# Patient Record
Sex: Female | Born: 1935 | Race: White | Hispanic: No | Marital: Single
Health system: Southern US, Community
[De-identification: ages and names within clinical notes are randomized; demographics above are authoritative.]

## PROBLEM LIST (undated history)

## (undated) DIAGNOSIS — R413 Other amnesia: Secondary | ICD-10-CM

---

## 2009-07-01 HISTORY — PX: LAPAROSCOPIC GASTRIC BANDING: SHX1100

## 2020-07-20 DIAGNOSIS — E782 Mixed hyperlipidemia: Secondary | ICD-10-CM | POA: Diagnosis not present

## 2020-07-20 DIAGNOSIS — I10 Essential (primary) hypertension: Secondary | ICD-10-CM | POA: Diagnosis not present

## 2020-07-20 DIAGNOSIS — F5101 Primary insomnia: Secondary | ICD-10-CM | POA: Diagnosis not present

## 2020-07-20 DIAGNOSIS — N3281 Overactive bladder: Secondary | ICD-10-CM | POA: Diagnosis not present

## 2020-07-20 DIAGNOSIS — N390 Urinary tract infection, site not specified: Secondary | ICD-10-CM | POA: Diagnosis not present

## 2020-07-20 DIAGNOSIS — K219 Gastro-esophageal reflux disease without esophagitis: Secondary | ICD-10-CM | POA: Diagnosis not present

## 2020-07-20 DIAGNOSIS — E1169 Type 2 diabetes mellitus with other specified complication: Secondary | ICD-10-CM | POA: Diagnosis not present

## 2020-10-22 ENCOUNTER — Emergency Department (HOSPITAL_COMMUNITY): Payer: Medicare Other

## 2020-10-22 ENCOUNTER — Encounter (HOSPITAL_COMMUNITY): Payer: Self-pay | Admitting: Internal Medicine

## 2020-10-22 ENCOUNTER — Inpatient Hospital Stay (HOSPITAL_COMMUNITY)
Admission: EM | Admit: 2020-10-22 | Discharge: 2020-10-25 | DRG: 871 | Disposition: A | Discharge status: Home Health | Payer: Medicare Other | Attending: Internal Medicine | Admitting: Internal Medicine

## 2020-10-22 DIAGNOSIS — N3281 Overactive bladder: Secondary | ICD-10-CM | POA: Diagnosis not present

## 2020-10-22 DIAGNOSIS — R680 Hypothermia, not associated with low environmental temperature: Secondary | ICD-10-CM | POA: Diagnosis present

## 2020-10-22 DIAGNOSIS — N1831 Chronic kidney disease, stage 3a: Secondary | ICD-10-CM

## 2020-10-22 DIAGNOSIS — D631 Anemia in chronic kidney disease: Secondary | ICD-10-CM | POA: Diagnosis not present

## 2020-10-22 DIAGNOSIS — Z9884 Bariatric surgery status: Secondary | ICD-10-CM | POA: Diagnosis not present

## 2020-10-22 DIAGNOSIS — A419 Sepsis, unspecified organism: Secondary | ICD-10-CM

## 2020-10-22 DIAGNOSIS — Z20822 Contact with and (suspected) exposure to covid-19: Secondary | ICD-10-CM | POA: Diagnosis not present

## 2020-10-22 DIAGNOSIS — N281 Cyst of kidney, acquired: Secondary | ICD-10-CM | POA: Diagnosis not present

## 2020-10-22 DIAGNOSIS — I10 Essential (primary) hypertension: Secondary | ICD-10-CM

## 2020-10-22 DIAGNOSIS — N1832 Chronic kidney disease, stage 3b: Secondary | ICD-10-CM | POA: Diagnosis present

## 2020-10-22 DIAGNOSIS — E1122 Type 2 diabetes mellitus with diabetic chronic kidney disease: Secondary | ICD-10-CM

## 2020-10-22 DIAGNOSIS — M549 Dorsalgia, unspecified: Secondary | ICD-10-CM | POA: Diagnosis not present

## 2020-10-22 DIAGNOSIS — R6520 Severe sepsis without septic shock: Secondary | ICD-10-CM | POA: Diagnosis present

## 2020-10-22 DIAGNOSIS — Z6834 Body mass index (BMI) 34.0-34.9, adult: Secondary | ICD-10-CM

## 2020-10-22 DIAGNOSIS — F32A Depression, unspecified: Secondary | ICD-10-CM

## 2020-10-22 DIAGNOSIS — I959 Hypotension, unspecified: Secondary | ICD-10-CM | POA: Diagnosis present

## 2020-10-22 DIAGNOSIS — Z87891 Personal history of nicotine dependence: Secondary | ICD-10-CM

## 2020-10-22 DIAGNOSIS — N179 Acute kidney failure, unspecified: Secondary | ICD-10-CM | POA: Diagnosis not present

## 2020-10-22 DIAGNOSIS — Z66 Do not resuscitate: Secondary | ICD-10-CM | POA: Diagnosis present

## 2020-10-22 DIAGNOSIS — I499 Cardiac arrhythmia, unspecified: Secondary | ICD-10-CM | POA: Diagnosis not present

## 2020-10-22 DIAGNOSIS — D1771 Benign lipomatous neoplasm of kidney: Secondary | ICD-10-CM | POA: Diagnosis not present

## 2020-10-22 DIAGNOSIS — F0390 Unspecified dementia without behavioral disturbance: Secondary | ICD-10-CM | POA: Diagnosis present

## 2020-10-22 DIAGNOSIS — R6889 Other general symptoms and signs: Secondary | ICD-10-CM | POA: Diagnosis not present

## 2020-10-22 DIAGNOSIS — N390 Urinary tract infection, site not specified: Secondary | ICD-10-CM | POA: Diagnosis not present

## 2020-10-22 DIAGNOSIS — K219 Gastro-esophageal reflux disease without esophagitis: Secondary | ICD-10-CM

## 2020-10-22 DIAGNOSIS — K449 Diaphragmatic hernia without obstruction or gangrene: Secondary | ICD-10-CM | POA: Diagnosis not present

## 2020-10-22 DIAGNOSIS — R0902 Hypoxemia: Secondary | ICD-10-CM | POA: Diagnosis not present

## 2020-10-22 DIAGNOSIS — B9620 Unspecified Escherichia coli [E. coli] as the cause of diseases classified elsewhere: Secondary | ICD-10-CM | POA: Diagnosis present

## 2020-10-22 DIAGNOSIS — I129 Hypertensive chronic kidney disease with stage 1 through stage 4 chronic kidney disease, or unspecified chronic kidney disease: Secondary | ICD-10-CM | POA: Diagnosis present

## 2020-10-22 DIAGNOSIS — E669 Obesity, unspecified: Secondary | ICD-10-CM | POA: Diagnosis present

## 2020-10-22 DIAGNOSIS — E785 Hyperlipidemia, unspecified: Secondary | ICD-10-CM | POA: Diagnosis not present

## 2020-10-22 DIAGNOSIS — Z87440 Personal history of urinary (tract) infections: Secondary | ICD-10-CM

## 2020-10-22 DIAGNOSIS — I517 Cardiomegaly: Secondary | ICD-10-CM | POA: Diagnosis not present

## 2020-10-22 DIAGNOSIS — N261 Atrophy of kidney (terminal): Secondary | ICD-10-CM | POA: Diagnosis not present

## 2020-10-22 DIAGNOSIS — R404 Transient alteration of awareness: Secondary | ICD-10-CM | POA: Diagnosis not present

## 2020-10-22 DIAGNOSIS — R9431 Abnormal electrocardiogram [ECG] [EKG]: Secondary | ICD-10-CM | POA: Diagnosis not present

## 2020-10-22 DIAGNOSIS — Z743 Need for continuous supervision: Secondary | ICD-10-CM | POA: Diagnosis not present

## 2020-10-22 DIAGNOSIS — E119 Type 2 diabetes mellitus without complications: Secondary | ICD-10-CM

## 2020-10-22 DIAGNOSIS — J189 Pneumonia, unspecified organism: Secondary | ICD-10-CM | POA: Diagnosis present

## 2020-10-22 DIAGNOSIS — G9341 Metabolic encephalopathy: Secondary | ICD-10-CM | POA: Diagnosis present

## 2020-10-22 HISTORY — DX: Other amnesia: R41.3

## 2020-10-22 HISTORY — DX: Sepsis, unspecified organism: A41.9

## 2020-10-22 HISTORY — DX: Gastro-esophageal reflux disease without esophagitis: K21.9

## 2020-10-22 HISTORY — DX: Chronic kidney disease, stage 3a: N18.31

## 2020-10-22 HISTORY — DX: Type 2 diabetes mellitus with diabetic chronic kidney disease: E11.22

## 2020-10-22 HISTORY — DX: Essential (primary) hypertension: I10

## 2020-10-22 LAB — URINALYSIS, ROUTINE W REFLEX MICROSCOPIC
Glucose, UA: NEGATIVE mg/dL
Hgb urine dipstick: NEGATIVE
Ketones, ur: NEGATIVE mg/dL
Nitrite: NEGATIVE
Protein, ur: 30 mg/dL — AB
Specific Gravity, Urine: 1.023 (ref 1.005–1.030)
WBC, UA: >50 WBC/hpf — ABNORMAL HIGH (ref 0–5)
pH: 5 (ref 5.0–8.0)

## 2020-10-22 LAB — CBC WITH DIFFERENTIAL/PLATELET
Abs Immature Granulocytes: 0.12 10*3/uL — ABNORMAL HIGH (ref 0.00–0.07)
Basophils Absolute: 0 10*3/uL (ref 0.0–0.1)
Basophils Relative: 0 %
Eosinophils Absolute: 0 10*3/uL (ref 0.0–0.5)
Eosinophils Relative: 0 %
HCT: 36.4 % (ref 36.0–46.0)
Hemoglobin: 11.2 g/dL — ABNORMAL LOW (ref 12.0–15.0)
Immature Granulocytes: 1 %
Lymphocytes Relative: 5 %
Lymphs Abs: 1 10*3/uL (ref 0.7–4.0)
MCH: 27 pg (ref 26.0–34.0)
MCHC: 30.8 g/dL (ref 30.0–36.0)
MCV: 87.7 fL (ref 80.0–100.0)
Monocytes Absolute: 1.3 10*3/uL — ABNORMAL HIGH (ref 0.1–1.0)
Monocytes Relative: 7 %
Neutro Abs: 17.4 10*3/uL — ABNORMAL HIGH (ref 1.7–7.7)
Neutrophils Relative %: 87 %
Platelets: 297 10*3/uL (ref 150–400)
RBC: 4.15 MIL/uL (ref 3.87–5.11)
RDW: 16.3 % — ABNORMAL HIGH (ref 11.5–15.5)
WBC: 19.8 10*3/uL — ABNORMAL HIGH (ref 4.0–10.5)
nRBC: 0 % (ref 0.0–0.2)

## 2020-10-22 LAB — RESP PANEL BY RT-PCR (FLU A&B, COVID) ARPGX2
Influenza A by PCR: NEGATIVE
Influenza B by PCR: NEGATIVE
SARS Coronavirus 2 by RT PCR: NEGATIVE

## 2020-10-22 LAB — COMPREHENSIVE METABOLIC PANEL
ALT: 20 U/L (ref 0–44)
AST: 30 U/L (ref 15–41)
Albumin: 3.4 g/dL — ABNORMAL LOW (ref 3.5–5.0)
Alkaline Phosphatase: 65 U/L (ref 38–126)
Anion gap: 10 (ref 5–15)
BUN: 36 mg/dL — ABNORMAL HIGH (ref 8–23)
CO2: 21 mmol/L — ABNORMAL LOW (ref 22–32)
Calcium: 9.9 mg/dL (ref 8.9–10.3)
Chloride: 107 mmol/L (ref 98–111)
Creatinine, Ser: 2.1 mg/dL — ABNORMAL HIGH (ref 0.44–1.00)
GFR, Estimated: 22 mL/min — ABNORMAL LOW (ref >60)
Glucose, Bld: 125 mg/dL — ABNORMAL HIGH (ref 70–99)
Potassium: 4.6 mmol/L (ref 3.5–5.1)
Sodium: 138 mmol/L (ref 135–145)
Total Bilirubin: 0.6 mg/dL (ref 0.3–1.2)
Total Protein: 6 g/dL — ABNORMAL LOW (ref 6.5–8.1)

## 2020-10-22 LAB — LACTIC ACID, PLASMA: Lactic Acid, Venous: 1.8 mmol/L (ref 0.5–1.9)

## 2020-10-22 LAB — PROTIME-INR
INR: 1 (ref 0.8–1.2)
Prothrombin Time: 13.6 seconds (ref 11.4–15.2)

## 2020-10-22 MED ORDER — ACETAMINOPHEN 325 MG PO TABS
650.0000 mg | ORAL_TABLET | Freq: Four times a day (QID) | ORAL | Status: DC | PRN
  Administered 2020-10-23 – 2020-10-25 (×5): 650 mg via ORAL
  Filled 2020-10-22 (×5): qty 2

## 2020-10-22 MED ORDER — POLYETHYLENE GLYCOL 3350 17 G PO PACK: 17.0000 g | PACK | Freq: Every day | ORAL | Status: DC | PRN

## 2020-10-22 MED ORDER — ENOXAPARIN SODIUM 30 MG/0.3ML ~~LOC~~ SOLN
30.0000 mg | SUBCUTANEOUS | Status: DC
  Administered 2020-10-22 – 2020-10-23 (×2): 30 mg via SUBCUTANEOUS
  Filled 2020-10-22 (×3): qty 0.3

## 2020-10-22 MED ORDER — SODIUM CHLORIDE 0.9 % IV SOLN
2.0000 g | INTRAVENOUS | Status: DC
  Administered 2020-10-23 – 2020-10-24 (×2): 2 g via INTRAVENOUS
  Filled 2020-10-22 (×2): qty 2

## 2020-10-22 MED ORDER — METRONIDAZOLE IN NACL 5-0.79 MG/ML-% IV SOLN
500.0000 mg | Freq: Three times a day (TID) | INTRAVENOUS | Status: DC
  Administered 2020-10-23 (×2): 500 mg via INTRAVENOUS
  Filled 2020-10-22 (×2): qty 100

## 2020-10-22 MED ORDER — VANCOMYCIN HCL 1500 MG/300ML IV SOLN
1500.0000 mg | Freq: Once | INTRAVENOUS | Status: AC
  Administered 2020-10-22: 1500 mg via INTRAVENOUS
  Filled 2020-10-22: qty 300

## 2020-10-22 MED ORDER — VANCOMYCIN HCL 1000 MG/200ML IV SOLN
1000.0000 mg | Freq: Once | INTRAVENOUS | Status: DC
  Filled 2020-10-22: qty 200

## 2020-10-22 MED ORDER — LACTATED RINGERS IV SOLN: INTRAVENOUS | Status: DC

## 2020-10-22 MED ORDER — LACTATED RINGERS IV SOLN: INTRAVENOUS | Status: AC

## 2020-10-22 MED ORDER — SODIUM CHLORIDE 0.9 % IV BOLUS (SEPSIS)
1000.0000 mL | Freq: Once | INTRAVENOUS | Status: AC
  Administered 2020-10-22: 1000 mL via INTRAVENOUS

## 2020-10-22 MED ORDER — SODIUM CHLORIDE 0.9 % IV BOLUS (SEPSIS): 250.0000 mL | Freq: Once | INTRAVENOUS | Status: DC

## 2020-10-22 MED ORDER — METRONIDAZOLE IN NACL 5-0.79 MG/ML-% IV SOLN
500.0000 mg | Freq: Once | INTRAVENOUS | Status: AC
  Administered 2020-10-22: 500 mg via INTRAVENOUS
  Filled 2020-10-22: qty 100

## 2020-10-22 MED ORDER — SODIUM CHLORIDE 0.9% FLUSH
3.0000 mL | Freq: Two times a day (BID) | INTRAVENOUS | Status: DC
  Administered 2020-10-22: 3 mL via INTRAVENOUS

## 2020-10-22 MED ORDER — INSULIN ASPART 100 UNIT/ML ~~LOC~~ SOLN
0.0000 [IU] | SUBCUTANEOUS | Status: DC
  Administered 2020-10-23 – 2020-10-25 (×3): 1 [IU] via SUBCUTANEOUS

## 2020-10-22 MED ORDER — VANCOMYCIN VARIABLE DOSE PER UNSTABLE RENAL FUNCTION (PHARMACIST DOSING): Status: DC

## 2020-10-22 MED ORDER — SODIUM CHLORIDE 0.9 % IV BOLUS (SEPSIS)
1000.0000 mL | Freq: Once | INTRAVENOUS | Status: AC
Start: 2020-10-22 — End: 2020-10-22
  Administered 2020-10-22: 1000 mL via INTRAVENOUS

## 2020-10-22 MED ORDER — LORAZEPAM 2 MG/ML IJ SOLN
1.0000 mg | Freq: Once | INTRAMUSCULAR | Status: AC
  Administered 2020-10-22: 1 mg via INTRAVENOUS
  Filled 2020-10-22: qty 1

## 2020-10-22 MED ORDER — SODIUM CHLORIDE 0.9 % IV SOLN
2.0000 g | Freq: Once | INTRAVENOUS | Status: AC
  Administered 2020-10-22: 2 g via INTRAVENOUS
  Filled 2020-10-22: qty 2

## 2020-10-22 MED ORDER — ACETAMINOPHEN 650 MG RE SUPP: 650.0000 mg | Freq: Four times a day (QID) | RECTAL | Status: DC | PRN

## 2020-10-22 NOTE — ED Provider Notes (Signed)
[facility] Provider Note   CSN: [id number] Arrival date & time: 10/22/20  1704     History Chief Complaint  Patient presents with  . Altered Mental Status    [facility] is a 87 y.o. female history of depression, diabetes, previous UTI here presenting with altered mental status.  Patient has lower back pain for the last several days.  Patient is demented at baseline and lives by herself.  Her daughter saw her yesterday and she was slightly confused.  She ended up taking her to a restaurant today and she became acutely confused.  She was noted to be hypoxic and hypotensive and hypothermic per EMS.  Patient was given Versed by EMS.  Patient was not able to give any meaningful history  The history is provided by the patient and a relative.  Level V caveat- AMS      No past medical history on file.  There are no problems to display for this patient.    OB History   No obstetric history on file.     No family history on file.     Home Medications Prior to Admission medications   Not on File    Allergies    Patient has no known allergies.  Review of Systems   Review of Systems  Psychiatric/Behavioral: Positive for confusion.  All other systems reviewed and are negative.   Physical Exam Updated Vital Signs BP (!) 124/49   Pulse 80   Temp 98.1 F (36.7 C)   Resp (!) 22   Ht 5\' 4"  (1.626 m)   Wt 90.7 kg   SpO2 96%   BMI 34.33 kg/m   Physical Exam Vitals and nursing note reviewed.  Constitutional:      Comments: Confused and sedated from Versed  HENT:     Head: Normocephalic.     Nose: Nose normal.     Mouth/Throat:     Mouth: Mucous membranes are dry.  Eyes:     Extraocular Movements: Extraocular movements intact.     Pupils: Pupils are equal, round, and reactive to light.  Cardiovascular:     Rate and Rhythm: Normal rate and regular rhythm.     Pulses: Normal pulses.     Heart sounds: Normal heart  sounds.  Pulmonary:     Effort: Pulmonary effort is normal.     Breath sounds: Normal breath sounds.  Abdominal:     General: Abdomen is flat.     Palpations: Abdomen is soft.  Musculoskeletal:        General: Normal range of motion.     Cervical back: Normal range of motion and neck supple.  Skin:    General: Skin is warm.     Capillary Refill: Capillary refill takes less than 2 seconds.  Neurological:     Comments: Confused and altered and unable to give history.  Patient is moving all extremities spontaneously  Psychiatric:     Comments: Unable     ED Results / Procedures / Treatments   Labs (all labs ordered are listed, but only abnormal results are displayed) Labs Reviewed  URINALYSIS, ROUTINE W REFLEX MICROSCOPIC - Abnormal; Notable for the following components:      Result Value   Color, Urine AMBER (*)    APPearance CLOUDY (*)    Bilirubin Urine MODERATE (*)    Protein, ur 30 (*)    Leukocytes,Ua LARGE (*)    WBC, UA >50 (*)  Bacteria, UA FEW (*)    Non Squamous Epithelial 0-5 (*)    All other components within normal limits  RESP PANEL BY RT-PCR (FLU A&B, COVID) [id number]  CULTURE, BLOOD (ROUTINE X 2)  CULTURE, BLOOD (ROUTINE X 2)  URINE CULTURE  LACTIC ACID, PLASMA  LACTIC ACID, PLASMA  COMPREHENSIVE METABOLIC PANEL  CBC WITH DIFFERENTIAL/PLATELET  PROTIME-INR  APTT    EKG EKG Interpretation  Date/Time:  Sunday October 22 2020 17:05:50 EDT Ventricular Rate:  81 PR Interval:  121 QRS Duration: 83 QT Interval:  387 QTC Calculation: 450 R Axis:   131 Text Interpretation: Right and left arm electrode reversal, interpretation assumes no reversal Sinus rhythm Atrial premature complexes Probable lateral infarct, age indeterminate No significant change since last tracing Confirmed by Wandra Arthurs ([id number]) on 10/22/2020 5:16:49 PM   Radiology CT Head Wo Contrast  Result Date: 10/22/2020 CLINICAL DATA:  Altered mental status. EXAM: CT HEAD WITHOUT CONTRAST  TECHNIQUE: Contiguous axial images were obtained from the base of the skull through the vertex without intravenous contrast. COMPARISON:  None. FINDINGS: Brain: No evidence of acute large vascular territory infarction, hemorrhage, hydrocephalus, extra-axial collection or mass lesion/mass effect. There are few foci of white matter hypoattenuation which likely reflect chronic ischemic microvascular disease. Vascular: No hyperdense vessel. Atherosclerotic calcifications of the internal carotid arteries at the skull base. Skull: Normal. Negative for fracture or focal lesion. Sinuses/Orbits: The paranasal sinuses and mastoid air cells are predominantly clear. Prior lens surgery. Other: Dental hardware. IMPRESSION: 1. No acute intracranial findings. 2. Mild chronic ischemic microvascular disease. Electronically Signed   By: Dahlia Bailiff MD   On: 10/22/2020 18:00   DG Chest Port 1 View  Result Date: 10/22/2020 CLINICAL DATA:  Questionable sepsis - evaluate for abnormality Patient reports back pain. EXAM: PORTABLE CHEST 1 VIEW COMPARISON:  None. FINDINGS: Lung volumes are low. The heart is enlarged. Aortic atherosclerosis. No focal airspace disease. No pulmonary edema, pleural effusion, or pneumothorax. Surgical clips in the right axilla. External artifact extends external to the patient in the supraclavicular regions. IMPRESSION: Low lung volumes without acute pulmonary process. Cardiomegaly. Electronically Signed   By: Keith Rake M.D.   On: 10/22/2020 18:31   CT Renal Stone Study  Result Date: 10/22/2020 CLINICAL DATA:  Flank pain, kidney stone suspected. EXAM: CT ABDOMEN AND PELVIS WITHOUT CONTRAST TECHNIQUE: Multidetector CT imaging of the abdomen and pelvis was performed following the standard protocol without IV contrast. COMPARISON:  None. FINDINGS: Lower chest: Bronchial wall thickening with patchy nodular ground-glass opacities in the right middle lobe and lingula. Hypoventilatory change in the  dependent lungs. Cardiac enlargement. Coronary artery calcifications. Small pericardial effusion, likely physiologic. Hepatobiliary: There are few hypodense hepatic lesions the largest of which measures 1 cm in the hepatic dome on image 10/4 difficult to characterize due to size and respiratory motion but favored represent cysts. Otherwise unremarkable noncontrast appearance of the hepatic parenchyma. Gallbladder surgically absent. No biliary ductal dilation. Pancreas: Fatty pancreatic atrophy.  No discrete ductal dilation. Spleen: Normal in size without focal abnormality. Adrenals/Urinary Tract: Bilateral adrenal glands are unremarkable. No hydronephrosis. Atrophic kidneys. 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 2.8 cm lesion in the upper pole of the left kidney with density greater than that expected for a simple renal cyst but incompletely evaluated without IV contrast on image 31/4. 4 mm calcification along the inferior aspect of the lesion which may represent calcifications in the wall of the lesion or a small  renal stone. No right-sided renal stones. No ureteral or bladder calculi visualized. Urinary bladder is decompressed limiting evaluation. Stomach/Bowel: Small hiatal hernia. Changes of prior gastric banding with reservoir in the anterior abdominal wall. Stomach is grossly unremarkable for degree of distension. Fluid-filled loops of small bowel without dilation. Appendix is grossly unremarkable. Fluid throughout the ascending and proximal transverse colon. Left-sided colonic diverticulosis without findings of acute diverticulitis. Fluid-filled rectum. Vascular/Lymphatic: Aortic atherosclerosis. No enlarged abdominal or pelvic lymph nodes. Reproductive: Status post hysterectomy. No adnexal masses. Other: No abdominopelvic ascites. Musculoskeletal: Multilevel degenerative changes spine. No acute osseous abnormality. IMPRESSION: 1. Fluid throughout nondilated small bowel, proximal  colon and rectum as can be seen with diarrheal illness. 2. No hydronephrosis. No ureteral or bladder calculi visualized. 3. Bronchial wall thickening with patchy nodular ground-glass opacities in the right middle lobe and lingula may represent an infectious or inflammatory process, including mycobacterium avium intracellulare. 4. Left upper pole renal lesion measuring 2.8 cm with density greater than that expected for a simple renal cyst and may represent a hemorrhagic/proteinaceous cyst but is incompletely evaluated on this single phase study without IV contrast. Further evaluation with nonemergent outpatient renal protocol CT or MRI with and without contrast is recommended. 5. 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 6. Aortic atherosclerosis. Aortic Atherosclerosis (ICD10-I70.0). Electronically Signed   By: Dahlia Bailiff MD   On: 10/22/2020 18:16    Procedures Procedures   CRITICAL CARE Performed by: Wandra Arthurs   Total critical care time:30 minutes  Critical care time was exclusive of separately billable procedures and treating other patients.  Critical care was necessary to treat or prevent imminent or life-threatening deterioration.  Critical care was time spent personally by me on the following activities: development of treatment plan with patient and/or surrogate as well as nursing, discussions with consultants, evaluation of patient's response to treatment, examination of patient, obtaining history from patient or surrogate, ordering and performing treatments and interventions, ordering and review of laboratory studies, ordering and review of radiographic studies, pulse oximetry and re-evaluation of patient's condition.    Medications Ordered in ED Medications  lactated ringers infusion ( Intravenous New Bag/Given 10/22/20 1733)  sodium chloride 0.9 % bolus 1,000 mL (0 mLs Intravenous Stopped 10/22/20 2051)    And  sodium chloride 0.9 % bolus 1,000 mL (has no  administration in time range)    And  sodium chloride 0.9 % bolus 250 mL (has no administration in time range)  vancomycin (VANCOREADY) IVPB 1500 mg/300 mL (has no administration in time range)  ceFEPIme (MAXIPIME) 2 g in sodium chloride 0.9 % 100 mL IVPB (0 g Intravenous Stopped 10/22/20 1940)  metroNIDAZOLE (FLAGYL) IVPB 500 mg (0 mg Intravenous Stopped 10/22/20 2052)  LORazepam (ATIVAN) injection 1 mg (1 mg Intravenous Given 10/22/20 1722)    ED Course  I have reviewed the triage vital signs and the nursing notes.  Pertinent labs & imaging results that were available during my care of the patient were reviewed by me and considered in my medical decision making (see chart for details).    MDM Rules/Calculators/A&P                         [facility] is a 87 y.o. female here presenting with altered mental status and hypoxia and hypotension. Patient meets SIRS criteria and notes concern for possible sepsis.  Per the daughter, patient had severe sepsis from UTI previously and was  admitted several years ago.  Code sepsis was activated.  I ordered CBC, CMP, lactate, cultures, UA, urine culture, chest x-ray.  We will also get CT head and CT renal stone.  Will give 30 cc/kg bolus and broad-spectrum antibiotics  8:57 PM Patient is a difficult IV stick so unfortunately IV team had to come and get IV access on patient so labs are delayed. WBC is 19. Given broad spectrum abx. CT renal stone showed no hydro.   9:32 PM Labs showed creatinine of 2 with no baseline.  UA is positive for UTI.  Her CT did not show any hydro.  She has a cyst in her kidney .  Patient will be admitted for sepsis from UTI, delirium.  Final Clinical Impression(s) / ED Diagnoses Final diagnoses:  None    Rx / DC Orders ED Discharge Orders    None       Drenda Freeze, MD 10/22/20 2133

## 2020-10-22 NOTE — Sepsis Progress Note (Signed)
Patient has MD order to not delay antibiotics if unable to obtain blood cultures.  Antibiotics given without cultures.  Message sent via secure chat to assigned RN to enter height & weight into Epic for IVF bolus calculation.

## 2020-10-22 NOTE — Progress Notes (Addendum)
Sepsis note  elink RN sent secure chat to assigned bedside RN to see if there were barriers prevent sepsis from being conducted. From reviewing the chart sepsis has been called now for 1.5 hrs and it doesn't appear labs have been drawn or abx started.   Bedside RN replied back they are having a hard time getting an IV started

## 2020-10-22 NOTE — Progress Notes (Signed)
[facility] following sepsis 

## 2020-10-22 NOTE — H&P (Signed)
History and Physical   Lindsay Martinez FTD:[id number] DOB: 1931/03/19 DOA: 10/22/2020  PCP: No primary care provider on file.   Patient coming from: Home  Chief Complaint: Altered mental status  HPI: Lindsay Martinez is a 87 y.o. female with medical history significant of dementia, overactive bladder, hypertension, hyperlipidemia, depression, GERD, diabetes who presents with altered mental status.  Patient presents with altered oral status.  History obtained with assistance of chart review and family.  She been complaining of some low back pain for the past 2  days possibly some urinary symptoms.  She has dementia at baseline and does currently live alone.  Her daughter lives nearby and saw her mother yesterday and noticed that she may have been a little bit confused.  They went out out to a restaurant today and she became acutely confused and her daughter got her mother home quickly.  She became more confused and unwell at home and EMS was called she was found to be hypoxic, hypothermic and hypotensive on arrival.  She was also agitated and received Versed for this.  Per family she has a history of prior UTI and sepsis requiring admission.  Also has dementia that this has not been formally diagnosed per her records.  Unable to obtain full review of systems due to patient's altered mental status.  ED Course: Vital signs in ED significant for initial soft blood pressure in the 06C systolic improved to the 376E to 110s after IV fluid bolus, respiratory rate in the 20s, hypothermia in the 93-96 range.  Lab work-up showed CMP with bicarb 12.1, creatinine 2.1 with no baseline to compare, glucose 125, protein 6, albumin 3.4.  CBC showed hemoglobin 11.2 and WBC 19.8.  PT, INR within normal limits.  Lactic acid normal on first check with repeat pending.  Respiratory panel flu COVID-negative.  Urinalysis consistent with UTI.  Urine culture and blood cultures pending.  Imaging work-up included chest x-ray with low  lung volumes but no acute changes.  CT head with no acute abnormality.  CT renal stone study with fluid in the bowel consistent with diarrheal illness, no hydronephrosis or obstructing stones, bronchial wall thickening and patchy groundglass opacity noted in lower lung fields, left renal pole lesion.  Patient was started on broad-spectrum antibiotics, given 2.5 L bolus and also received dose of Ativan for agitation.  Review of Systems: Unable to obtain full review of systems in patient's status.  Past Medical History:  Diagnosis Date  . Memory impairment   For chart review has a history of: Overactive bladder Hypertension Hyperlipidemia Depression GERD Diabetes  Past Surgical History:  Procedure Laterality Date  . LAPAROSCOPIC GASTRIC BANDING  2011    Social History  reports that she has quit smoking. She has never used smokeless tobacco. She reports current alcohol use. No history on file for drug use.  No Known Allergies  Family History  Family history unknown: Yes  Unknown at this time  Prior to Admission medications   Not on File  Per chart review: Atorvastatin 10 mg daily Lisinopril 20 mg daily Trazodone 100 mg nightly Daily PPI Oxybutynin 5 mg twice daily Paroxetine 20 mg daily  Physical Exam: Vitals:   10/22/20 2027 10/22/20 2100 10/22/20 2115 10/22/20 2145  BP:  126/64 (!) 123/49 (!) 114/51  Pulse:  84 88 87  Resp:  (!) 22 (!) 32 20  Temp:  (!) 92.9 F (33.8 C) (!) 91.7 F (33.2 C) (!) 94.6 F (34.8 C)  SpO2:  96% 98% 99%  Weight: 90.7 kg     Height: 5\' 4"  (1.626 m)      Physical Exam Constitutional:      General: She is not in acute distress.    Appearance: She is obese. She is ill-appearing.  HENT:     Head: Normocephalic and atraumatic.     Mouth/Throat:     Mouth: Mucous membranes are moist.     Pharynx: Oropharynx is clear.  Eyes:     Extraocular Movements: Extraocular movements intact.     Pupils: Pupils are equal, round, and reactive to  light.  Cardiovascular:     Rate and Rhythm: Normal rate and regular rhythm.     Pulses: Normal pulses.     Heart sounds: Normal heart sounds.  Pulmonary:     Effort: Pulmonary effort is normal. No respiratory distress.     Breath sounds: Normal breath sounds.  Abdominal:     General: Bowel sounds are normal. There is no distension.     Palpations: Abdomen is soft.     Tenderness: There is no abdominal tenderness.  Musculoskeletal:        General: No swelling or deformity.  Skin:    General: Skin is warm and dry.  Neurological:     Comments:  Awful mental status, significant confusion, but is alert and oriented to self    Labs on Admission: I have personally reviewed following labs and imaging studies  CBC: Recent Labs  Lab 10/22/20 2041  WBC 19.8*  NEUTROABS 17.4*  HGB 11.2*  HCT 36.4  MCV 87.7  PLT 326    Basic Metabolic Panel: Recent Labs  Lab 10/22/20 2041  NA 138  K 4.6  CL 107  CO2 21*  GLUCOSE 125*  BUN 36*  CREATININE 2.10*  CALCIUM 9.9    GFR: Estimated Creatinine Clearance: 20.6 mL/min (A) (by C-G formula based on SCr of 2.1 mg/dL (H)).  Liver Function Tests: Recent Labs  Lab 10/22/20 2041  AST 30  ALT 20  ALKPHOS 65  BILITOT 0.6  PROT 6.0*  ALBUMIN 3.4*    Urine analysis:    Component Value Date/Time   COLORURINE AMBER (A) 10/22/2020 1850   APPEARANCEUR CLOUDY (A) 10/22/2020 1850   LABSPEC 1.023 10/22/2020 1850   PHURINE 5.0 10/22/2020 1850   GLUCOSEU NEGATIVE 10/22/2020 1850   HGBUR NEGATIVE 10/22/2020 1850   BILIRUBINUR MODERATE (A) 10/22/2020 1850   KETONESUR NEGATIVE 10/22/2020 1850   PROTEINUR 30 (A) 10/22/2020 1850   NITRITE NEGATIVE 10/22/2020 1850   LEUKOCYTESUR LARGE (A) 10/22/2020 1850    Radiological Exams on Admission: CT Head Wo Contrast  Result Date: 10/22/2020 CLINICAL DATA:  Altered mental status. EXAM: CT HEAD WITHOUT CONTRAST TECHNIQUE: Contiguous axial images were obtained from the base of the skull  through the vertex without intravenous contrast. COMPARISON:  None. FINDINGS: Brain: No evidence of acute large vascular territory infarction, hemorrhage, hydrocephalus, extra-axial collection or mass lesion/mass effect. There are few foci of white matter hypoattenuation which likely reflect chronic ischemic microvascular disease. Vascular: No hyperdense vessel. Atherosclerotic calcifications of the internal carotid arteries at the skull base. Skull: Normal. Negative for fracture or focal lesion. Sinuses/Orbits: The paranasal sinuses and mastoid air cells are predominantly clear. Prior lens surgery. Other: Dental hardware. IMPRESSION: 1. No acute intracranial findings. 2. Mild chronic ischemic microvascular disease. Electronically Signed   By: Dahlia Bailiff MD   On: 10/22/2020 18:00   DG Chest Port 1 View  Result Date: 10/22/2020 CLINICAL  DATA:  Questionable sepsis - evaluate for abnormality Patient reports back pain. EXAM: PORTABLE CHEST 1 VIEW COMPARISON:  None. FINDINGS: Lung volumes are low. The heart is enlarged. Aortic atherosclerosis. No focal airspace disease. No pulmonary edema, pleural effusion, or pneumothorax. Surgical clips in the right axilla. External artifact extends external to the patient in the supraclavicular regions. IMPRESSION: Low lung volumes without acute pulmonary process. Cardiomegaly. Electronically Signed   By: Narda RutherfordMelanie  Sanford M.D.   On: 10/22/2020 18:31   CT Renal Stone Study  Result Date: 10/22/2020 CLINICAL DATA:  Flank pain, kidney stone suspected. EXAM: CT ABDOMEN AND PELVIS WITHOUT CONTRAST TECHNIQUE: Multidetector CT imaging of the abdomen and pelvis was performed following the standard protocol without IV contrast. COMPARISON:  None. FINDINGS: Lower chest: Bronchial wall thickening with patchy nodular ground-glass opacities in the right middle lobe and lingula. Hypoventilatory change in the dependent lungs. Cardiac enlargement. Coronary artery calcifications. Small  pericardial effusion, likely physiologic. Hepatobiliary: There are few hypodense hepatic lesions the largest of which measures 1 cm in the hepatic dome on image 10/4 difficult to characterize due to size and respiratory motion but favored represent cysts. Otherwise unremarkable noncontrast appearance of the hepatic parenchyma. Gallbladder surgically absent. No biliary ductal dilation. Pancreas: Fatty pancreatic atrophy.  No discrete ductal dilation. Spleen: Normal in size without focal abnormality. Adrenals/Urinary Tract: Bilateral adrenal glands are unremarkable. No hydronephrosis. Atrophic kidneys. 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 2.8 cm lesion in the upper pole of the left kidney with density greater than that expected for a simple renal cyst but incompletely evaluated without IV contrast on image 31/4. 4 mm calcification along the inferior aspect of the lesion which may represent calcifications in the wall of the lesion or a small renal stone. No right-sided renal stones. No ureteral or bladder calculi visualized. Urinary bladder is decompressed limiting evaluation. Stomach/Bowel: Small hiatal hernia. Changes of prior gastric banding with reservoir in the anterior abdominal wall. Stomach is grossly unremarkable for degree of distension. Fluid-filled loops of small bowel without dilation. Appendix is grossly unremarkable. Fluid throughout the ascending and proximal transverse colon. Left-sided colonic diverticulosis without findings of acute diverticulitis. Fluid-filled rectum. Vascular/Lymphatic: Aortic atherosclerosis. No enlarged abdominal or pelvic lymph nodes. Reproductive: Status post hysterectomy. No adnexal masses. Other: No abdominopelvic ascites. Musculoskeletal: Multilevel degenerative changes spine. No acute osseous abnormality. IMPRESSION: 1. Fluid throughout nondilated small bowel, proximal colon and rectum as can be seen with diarrheal illness. 2. No hydronephrosis.  No ureteral or bladder calculi visualized. 3. Bronchial wall thickening with patchy nodular ground-glass opacities in the right middle lobe and lingula may represent an infectious or inflammatory process, including mycobacterium avium intracellulare. 4. Left upper pole renal lesion measuring 2.8 cm with density greater than that expected for a simple renal cyst and may represent a hemorrhagic/proteinaceous cyst but is incompletely evaluated on this single phase study without IV contrast. Further evaluation with nonemergent outpatient renal protocol CT or MRI with and without contrast is recommended. 5. 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 6. Aortic atherosclerosis. Aortic Atherosclerosis (ICD10-I70.0). Electronically Signed   By: Maudry MayhewJeffrey  Waltz MD   On: 10/22/2020 18:16   EKG: Independently reviewed.  Sinus rhythm assuming right left electrode reversal.  PACs.  Some T wave flattening in V1, lead II.  No prior to compare.  Assessment/Plan Principal Problem:   Sepsis due to urinary tract infection (HCC) Active Problems:   Overactive bladder   HTN (hypertension)  HLD (hyperlipidemia)   Depression   GERD (gastroesophageal reflux disease)   Diabetes (HCC)  Sepsis due to urinary tract infection > Patient presenting with hypothermia, soft blood pressures, tachycardia with recent complaints of flank pain and prior episode of sepsis secondary to UTI per family report. > Is altered and with a history of dementia is unable to obtain full history from patient. > Meets sepsis criteria with hypothermia of 93 denies sick degrees Fahrenheit, tachypnea, leukocytosis to 19.8. > Source likely UTI given positive findings on urinalysis however CT abdomen did note bronchial wall thickening and patchy groundglass opacities in the lower lung fields.  We will continue with broad-spectrum antibiotics overnight. - Monitor on progressive - Continue IV fluids - Continue Vanc, cefepime and  Flagyl - Trend lactic acid - Monitor fever curve and white count - Follow-up urine cultures and blood cultures  AKI on CKD > Presumed AKI, creatinine 2.1, no prior history of CKD however recent lab work at [place] showed creatinine of 1.47. > Received 2.5 L bolus in ED - Continue IV fluids - Avoid nephrotoxic agents - Trend renal function and electrolytes  Diabetes - Every 4 hours SSI while n.p.o.   Overactive bladder - Holding oxybutynin for now  Hypertension - Holding lisinopril for now given soft blood pressures  Hyperlipidemia - Holding home atorvastatin given altered mental status  Depression - Holding home paroxetine given altered mental status  DVT prophylaxis: Lovenox  Code Status:   DNR, would want a trial of pressors if blood pressure not responding to IV fluids. Family Communication:  Discussed with daughter at bedside.  Her name is Kimberli and she can be reached at [phone]. Disposition Plan:   Patient is from:  Home  Anticipated DC to:  Pending clinical course  Anticipated DC date:  2 to 10 days  Anticipated DC barriers: None  Consults called:  None  Admission status:  Inpatient, progressive   Severity of Illness: The appropriate patient status for this patient is INPATIENT. Inpatient status is judged to be reasonable and necessary in order to provide the required intensity of service to ensure the patient's safety. The patient's presenting symptoms, physical exam findings, and initial radiographic and laboratory data in the context of their chronic comorbidities is felt to place them at high risk for further clinical deterioration. Furthermore, it is not anticipated that the patient will be medically stable for discharge from the hospital within 2 midnights of admission. The following factors support the patient status of inpatient.   " The patient's presenting symptoms include hypothermia, confusion, flank pain, soft blood pressure. " The worrisome  physical exam findings include altered mental status. " The initial radiographic and laboratory data are worrisome because of CT renal stone study with patchy groundglass opacities in right middle lobe and bronchial wall thickening on lower lung fields.  Creatinine 2.1, WBC 19.8, hemoglobin 11.2, urinalysis consistent with UTI. " The chronic co-morbidities include overactive bladder, hypertension, hyperlipidemia, depression, GERD, diabetes, dementia.   * I certify that at the point of admission it is my clinical judgment that the patient will require inpatient hospital care spanning beyond 2 midnights from the point of admission due to high intensity of service, high risk for further deterioration and high frequency of surveillance required.Marcelyn Bruins MD [facility]  How to contact the [facility] Attending or Consulting provider [place] or covering provider during after hours [place], for this patient?   1. Check the care team in [facility]  and look for a) attending/consulting [place] provider listed and b) the [employer] team listed 2. Log into [email or web address] and use [place]'s universal password to access. If you do not have the password, please contact the hospital operator. 3. Locate the [facility] provider you are looking for under [facility] and page to a number that you can be directly reached. 4. If you still have difficulty reaching the provider, please page the [facility] (Director on Call) for the Hospitalists listed on amion for assistance.  10/22/2020, 10:54 PM

## 2020-10-22 NOTE — ED Triage Notes (Signed)
Patient arrives from home, was out at a restaurant with family today and declined quickly. Patient has been reporting back pain on the right side for the last couple of days. Patient hx of dementia, however seemed much more confused today.

## 2020-10-22 NOTE — Progress Notes (Signed)
Pharmacy Antibiotic Note  Lindsay Martinez is a 87 y.o. female admitted on 10/22/2020 with infection of unknown source.  Pharmacy has been consulted for Cefepime and vancomycin dosing.   SCr 2.1, WBC 19.8. LA 1.8  Plan: -Cefepime 2 gm IV Q 24 hours -Vancomycin 1500 mg IV load followed by vancomycin dosing per levels -Monitor CBC, renal fx, cultures and clinical progress     No data recorded.  No results for input(s): WBC, CREATININE, LATICACIDVEN, VANCOTROUGH, VANCOPEAK, VANCORANDOM, GENTTROUGH, GENTPEAK, GENTRANDOM, TOBRATROUGH, TOBRAPEAK, TOBRARND, AMIKACINPEAK, AMIKACINTROU, AMIKACIN in the last 168 hours.  CrCl cannot be calculated (No successful lab value found.).    No Known Allergies  Antimicrobials this admission: Cefepime 4/24 >>  Vanc 4/24 >>   Dose adjustments this admission:   Microbiology results: 4/24 BCx:  4/24 UCx:     Thank you for allowing pharmacy to be a part of this patient's care.  Albertina Parr, PharmD., BCPS, BCCCP Clinical Pharmacist Please refer to [facility] for unit-specific pharmacist

## 2020-10-23 ENCOUNTER — Other Ambulatory Visit: Payer: Self-pay

## 2020-10-23 LAB — CBC
HCT: 33.8 % — ABNORMAL LOW (ref 36.0–46.0)
Hemoglobin: 9.8 g/dL — ABNORMAL LOW (ref 12.0–15.0)
MCH: 26.6 pg (ref 26.0–34.0)
MCHC: 29 g/dL — ABNORMAL LOW (ref 30.0–36.0)
MCV: 91.6 fL (ref 80.0–100.0)
Platelets: 229 10*3/uL (ref 150–400)
RBC: 3.69 MIL/uL — ABNORMAL LOW (ref 3.87–5.11)
RDW: 16.4 % — ABNORMAL HIGH (ref 11.5–15.5)
WBC: 16.3 10*3/uL — ABNORMAL HIGH (ref 4.0–10.5)
nRBC: 0 % (ref 0.0–0.2)

## 2020-10-23 LAB — COMPREHENSIVE METABOLIC PANEL
ALT: 14 U/L (ref 0–44)
AST: 21 U/L (ref 15–41)
Albumin: 2.7 g/dL — ABNORMAL LOW (ref 3.5–5.0)
Alkaline Phosphatase: 56 U/L (ref 38–126)
Anion gap: 7 (ref 5–15)
BUN: 36 mg/dL — ABNORMAL HIGH (ref 8–23)
CO2: 18 mmol/L — ABNORMAL LOW (ref 22–32)
Calcium: 8.9 mg/dL (ref 8.9–10.3)
Chloride: 114 mmol/L — ABNORMAL HIGH (ref 98–111)
Creatinine, Ser: 1.73 mg/dL — ABNORMAL HIGH (ref 0.44–1.00)
GFR, Estimated: 28 mL/min — ABNORMAL LOW (ref >60)
Glucose, Bld: 105 mg/dL — ABNORMAL HIGH (ref 70–99)
Potassium: 4.3 mmol/L (ref 3.5–5.1)
Sodium: 139 mmol/L (ref 135–145)
Total Bilirubin: 0.5 mg/dL (ref 0.3–1.2)
Total Protein: 4.9 g/dL — ABNORMAL LOW (ref 6.5–8.1)

## 2020-10-23 LAB — PROTIME-INR
INR: 1.1 (ref 0.8–1.2)
Prothrombin Time: 14.4 seconds (ref 11.4–15.2)

## 2020-10-23 LAB — GLUCOSE, CAPILLARY
Glucose-Capillary: 103 mg/dL — ABNORMAL HIGH (ref 70–99)
Glucose-Capillary: 124 mg/dL — ABNORMAL HIGH (ref 70–99)
Glucose-Capillary: 133 mg/dL — ABNORMAL HIGH (ref 70–99)
Glucose-Capillary: 94 mg/dL (ref 70–99)
Glucose-Capillary: 97 mg/dL (ref 70–99)
Glucose-Capillary: 98 mg/dL (ref 70–99)

## 2020-10-23 LAB — APTT: aPTT: 27 seconds (ref 24–36)

## 2020-10-23 LAB — HEMOGLOBIN A1C
Hgb A1c MFr Bld: 6.7 % — ABNORMAL HIGH (ref 4.8–5.6)
Mean Plasma Glucose: 145.59 mg/dL

## 2020-10-23 LAB — PROCALCITONIN: Procalcitonin: 0.2 ng/mL

## 2020-10-23 LAB — CORTISOL-AM, BLOOD: Cortisol - AM: 6.5 ug/dL — ABNORMAL LOW (ref 6.7–22.6)

## 2020-10-23 LAB — LACTIC ACID, PLASMA: Lactic Acid, Venous: 1.4 mmol/L (ref 0.5–1.9)

## 2020-10-23 MED ORDER — VANCOMYCIN HCL 500 MG/100ML IV SOLN
500.0000 mg | INTRAVENOUS | Status: DC
  Administered 2020-10-23: 500 mg via INTRAVENOUS
  Filled 2020-10-23 (×2): qty 100

## 2020-10-23 MED ORDER — SODIUM CHLORIDE 0.9 % IV SOLN: INTRAVENOUS | Status: DC

## 2020-10-23 MED ORDER — CHLORHEXIDINE GLUCONATE CLOTH 2 % EX PADS
6.0000 | MEDICATED_PAD | Freq: Every day | CUTANEOUS | Status: DC
  Administered 2020-10-23 – 2020-10-24 (×2): 6 via TOPICAL

## 2020-10-23 MED ORDER — VANCOMYCIN HCL 500 MG/100ML IV SOLN: 500.0000 mg | INTRAVENOUS | Status: DC

## 2020-10-23 NOTE — Progress Notes (Addendum)
Pharmacy Antibiotic Note  Lindsay Martinez is a 87 y.o. female admitted on 10/22/2020 with Sepsis likely from UTI  Pharmacy has been consulted for Cefepime and vancomycin dosing. Patient is also on metronidazole  Scr 1.73 down, ClCr ~24 ml/min.  WBC 16, LA 1.4, PCT 0.2.   4/25 Vancomycin 500mg  Q 24 hr Scr used: 1.73 mg/dL Weight: 90.7 kg Vd coeff: 0.5 L/kg Est AUC: 529  Addendum: Spoke with MD, ok to stop metronidazole and vancomycin given suspected urinary source. Culture pending.  Plan: -Cefepime 2 gm IV Q 24 hours -Stop vancomycin and metronidazole -Monitor CBC, renal fx, cultures and clinical progress  Height: 5\' 4"  (162.6 cm) Weight: 90.7 kg (200 lb) IBW/kg (Calculated) : 54.7  Temp (24hrs), Avg:95.9 F (35.5 C), Min:91.7 F (33.2 C), Max:98.1 F (36.7 C)  Recent Labs  Lab 10/22/20 2039 10/22/20 2041 10/23/20 0224  WBC  --  19.8* 16.3*  CREATININE  --  2.10* 1.73*  LATICACIDVEN 1.8  --  1.4    Estimated Creatinine Clearance: 25 mL/min (A) (by C-G formula based on SCr of 1.73 mg/dL (H)).    No Known Allergies  Antimicrobials this admission: Cefe 4/24 >> Vanc 4/24 >>  MTZ 4/24 >>     Microbiology results: 4/24 large leuk, WBC > 50 4/24 UCX pending 4/25 Bcx-pend,  inadequate volume    Thank you for allowing pharmacy to be a part of this patient's care.  Albertina Parr, PharmD., BCPS, BCCCP Clinical Pharmacist Please refer to [facility] for unit-specific pharmacist

## 2020-10-23 NOTE — Progress Notes (Signed)
Pharmacy Antibiotic Note  Lindsay Martinez is a 87 y.o. female admitted on 10/22/2020 with Sepsis started on vancomyini and cefepime and metronidazole.  vancomycin stopped earlier today - presumed UTI but patient with possible pneumonia and vancomycin restarted - loaded with  vancomycin 4/24  cr 1.73 down, ClCr ~24 ml/min.  WBC 16, LA 1.4, PCT 0.2.   4/25 Vancomycin 500mg  Q 24 hr Scr used: 1.73 mg/dL Weight: 90.7 kg Vd coeff: 0.5 L/kg Est AUC: 529   Plan: -Cefepime 2 gm IV Q 24 hours -vancomycin 500mg  q24h  -Monitor CBC, renal fx, cultures and clinical progress  Height: 5\' 4"  (162.6 cm) Weight: 90.7 kg (200 lb) IBW/kg (Calculated) : 54.7  Temp (24hrs), Avg:96.4 F (35.8 C), Min:91.7 F (33.2 C), Max:98.1 F (36.7 C)  Recent Labs  Lab 10/22/20 2039 10/22/20 2041 10/23/20 0224  WBC  --  19.8* 16.3*  CREATININE  --  2.10* 1.73*  LATICACIDVEN 1.8  --  1.4    Estimated Creatinine Clearance: 25 mL/min (A) (by C-G formula based on SCr of 1.73 mg/dL (H)).    No Known Allergies  Antimicrobials this admission: Cefe 4/24 >> Vanc 4/24 >>  MTZ 4/24 >> 4/25    Microbiology results: 4/24 large leuk, WBC > 50 4/24 UCX pending 4/25 Bcx-pend,  inadequate volume    Bonnita Nasuti Pharm.D. CPP, BCPS Clinical Pharmacist [phone] 10/23/2020 4:16 PM

## 2020-10-23 NOTE — Progress Notes (Signed)
PROGRESS NOTE    Chandlar Staebell Sterkel  WUJ:[id number] DOB: 1931-06-26 DOA: 10/22/2020 PCP: Pcp, No    Chief Complaint  Patient presents with  . Altered Mental Status    Brief Narrative:  ROSELL KHOURI is a 87 y.o. female with medical history significant of dementia, overactive bladder, hypertension, hyperlipidemia, depression, GERD, diabetes who presents with altered mental status.   Assessment & Plan:   Principal Problem:   Sepsis due to urinary tract infection ([place]) Active Problems:   Overactive bladder   HTN (hypertension)   HLD (hyperlipidemia)   Depression   GERD (gastroesophageal reflux disease)   Diabetes (HCC)   Sepsis secondary to UTI and possible right middle lobe pneumonia.  Started the patient on broad spectrum IV antibiotics.  Urine and blood cultures ordered and pending.  SLP evalution to evaluate for aspiration.  Temp normalized and leukocytosis is improving   Anemia of chronic disease:  Hemoglobin around 9.    Hypertension:  bp parameters have improved.    Hyperlipidemia:  Resume statin on discharge.   GERD  Stable.    Diabetes Mellitus:  Type 2 , non insulin dependent and A1c is 6.7.  Continue with SSI.    H/o depression:  Resume home meds on discharge.     AKI on stage 3 b CKD. Baseline creatinine appears to be around 1.4 Admitted with a creatinine of 2, improved to 1.7 with IV fluids.  Avoid nephrotoxins.  No hydronephrosis on CT renal study.     DVT prophylaxis: (Lovenox)) Code Status: (Full code.) Family Communication: none at bedside.  Disposition:   Status is: Inpatient  Remains inpatient appropriate because:Ongoing diagnostic testing needed not appropriate for outpatient work up and IV treatments appropriate due to intensity of illness or inability to take PO   Dispo: The patient is from: Home              Anticipated d/c is to: pending              Patient currently is not medically stable to d/c.   Difficult to  place patient No       Consultants:   none   Procedures:  CT renal study shows Fluid throughout nondilated small bowel, proximal colon and rectum as can be seen with diarrheal illness.  No hydronephrosis. No ureteral or bladder calculi visualized.  Bronchial wall thickening with patchy nodular ground-glass opacities in the right middle lobe and lingula may represent an infectious or inflammatory process, including mycobacterium avium intracellulare.  Left upper pole renal lesion measuring 2.8 cm with density greater than that expected for a simple renal cyst and may represent a hemorrhagic/proteinaceous cyst but is incompletely evaluated on this single phase study without IV contrast. Further evaluation with nonemergent outpatient renal protocol CT or MRI with and without  contrast is recommended.  Antimicrobials:  Antibiotics Given (last 72 hours)    Date/Time Action Medication Dose Rate   10/22/20 1859 New Bag/Given   ceFEPIme (MAXIPIME) 2 g in sodium chloride 0.9 % 100 mL IVPB 2 g 200 mL/hr   10/22/20 1942 New Bag/Given   metroNIDAZOLE (FLAGYL) IVPB 500 mg 500 mg 100 mL/hr   10/22/20 2054 New Bag/Given   vancomycin (VANCOREADY) IVPB 1500 mg/300 mL 1,500 mg 150 mL/hr   10/23/20 0041 New Bag/Given   metroNIDAZOLE (FLAGYL) IVPB 500 mg 500 mg 100 mL/hr   10/23/20 0910 New Bag/Given   metroNIDAZOLE (FLAGYL) IVPB 500 mg 500 mg 100 mL/hr  Subjective: No new complaints  Objective: Vitals:   10/23/20 0337 10/23/20 0343 10/23/20 0717 10/23/20 1200  BP:  (!) 124/53 (!) 154/70 122/71  Pulse:  74 81 79  Resp: 18 18 16 17   Temp:  97.8 F (36.6 C) (!) 97.5 F (36.4 C) 98.1 F (36.7 C)  TempSrc: Oral Oral Oral   SpO2:  93% 99% 100%  Weight:      Height:        Intake/Output Summary (Last 24 hours) at 10/23/2020 1506 Last data filed at 10/23/2020 1202 Gross per 24 hour  Intake 214 ml  Output 475 ml  Net -261 ml   Filed Weights   10/22/20 2027   Weight: 90.7 kg    Examination:  General exam: Appears calm and comfortable  Respiratory system: Clear to auscultation. Respiratory effort normal. Cardiovascular system: S1 & S2 heard, RRR. No JVD, No pedal edema. Gastrointestinal system: Abdomen is nondistended, soft and nontender.Normal bowel sounds heard. Central nervous system: somnolent, but opens eyes to answer questions briefly.  Extremities: no pedal edema.  Skin: No rashes, Psychiatry: Mood is appropriate.     Data Reviewed: I have personally reviewed following labs and imaging studies  CBC: Recent Labs  Lab 10/22/20 2041 10/23/20 0224  WBC 19.8* 16.3*  NEUTROABS 17.4*  --   HGB 11.2* 9.8*  HCT 36.4 33.8*  MCV 87.7 91.6  PLT 297 [id number]    Basic Metabolic Panel: Recent Labs  Lab 10/22/20 2041 10/23/20 0224  NA 138 139  K 4.6 4.3  CL 107 114*  CO2 21* 18*  GLUCOSE 125* 105*  BUN 36* 36*  CREATININE 2.10* 1.73*  CALCIUM 9.9 8.9    GFR: Estimated Creatinine Clearance: 25 mL/min (A) (by C-G formula based on SCr of 1.73 mg/dL (H)).  Liver Function Tests: Recent Labs  Lab 10/22/20 2041 10/23/20 0224  AST 30 21  ALT 20 14  ALKPHOS 65 56  BILITOT 0.6 0.5  PROT 6.0* 4.9*  ALBUMIN 3.4* 2.7*    CBG: Recent Labs  Lab 10/23/20 0021 10/23/20 0510 10/23/20 0811 10/23/20 1122  GLUCAP 133* 98 103* 97     Recent Results (from the past 240 hour(s))  Resp Panel by RT-PCR (Flu A&B, Covid) Nasopharyngeal Swab     Status: None   Collection Time: 10/22/20  7:55 PM   Specimen: Nasopharyngeal Swab; Nasopharyngeal(NP) swabs in vial transport medium  Result Value Ref Range Status   SARS Coronavirus 2 by RT PCR NEGATIVE NEGATIVE Final    Comment: (NOTE) SARS-CoV-2 target nucleic acids are NOT DETECTED.  The SARS-CoV-2 RNA is generally detectable in upper respiratory specimens during the acute phase of infection. The lowest concentration of SARS-CoV-2 viral copies this assay can detect is 138 copies/mL. A  negative result does not preclude SARS-Cov-2 infection and should not be used as the sole basis for treatment or other patient management decisions. A negative result may occur with  improper specimen collection/handling, submission of specimen other than nasopharyngeal swab, presence of viral mutation(s) within the areas targeted by this assay, and inadequate number of viral copies(<138 copies/mL). A negative result must be combined with clinical observations, patient history, and epidemiological information. The expected result is Negative.  Fact Sheet for Patients:  EntrepreneurPulse.com.au  Fact Sheet for Healthcare Providers:  IncredibleEmployment.be  This test is no t yet approved or cleared by the [place] FDA and  has been authorized for detection and/or diagnosis of SARS-CoV-2 by FDA under an Emergency Use Authorization (EUA). This  EUA will remain  in effect (meaning this test can be used) for the duration of the COVID-19 declaration under Section 564(b)(1) of the Act, 21 U.S.C.section 360bbb-3(b)(1), unless the authorization is terminated  or revoked sooner.       Influenza A by PCR NEGATIVE NEGATIVE Final   Influenza B by PCR NEGATIVE NEGATIVE Final    Comment: (NOTE) The Xpert Xpress SARS-CoV-2/FLU/RSV plus assay is intended as an aid in the diagnosis of influenza from Nasopharyngeal swab specimens and should not be used as a sole basis for treatment. Nasal washings and aspirates are unacceptable for Xpert Xpress SARS-CoV-2/FLU/RSV testing.  Fact Sheet for Patients: EntrepreneurPulse.com.au  Fact Sheet for Healthcare Providers: IncredibleEmployment.be  This test is not yet approved or cleared by the [place] FDA and has been authorized for detection and/or diagnosis of SARS-CoV-2 by FDA under an Emergency Use Authorization (EUA). This EUA will remain in effect (meaning this test can  be used) for the duration of the COVID-19 declaration under Section 564(b)(1) of the Act, 21 U.S.C. section 360bbb-3(b)(1), unless the authorization is terminated or revoked.  Performed at [facility], [place] [address], [place], [place] [address]   Blood Culture (routine x 2)     Status: None (Preliminary result)   Collection Time: 10/23/20  2:24 AM   Specimen: BLOOD RIGHT ARM  Result Value Ref Range Status   Specimen Description BLOOD RIGHT ARM  Final   Special Requests   Final    BOTTLES DRAWN AEROBIC ONLY Blood Culture results may not be optimal due to an inadequate volume of blood received in culture bottles Performed at [facility] [address] [place], [place] [address]    Culture PENDING  Incomplete   Report Status PENDING  Incomplete  Blood Culture (routine x 2)     Status: None (Preliminary result)   Collection Time: 10/23/20  2:24 AM   Specimen: BLOOD RIGHT ARM  Result Value Ref Range Status   Specimen Description BLOOD RIGHT ARM  Final   Special Requests   Final    BOTTLES DRAWN AEROBIC ONLY Blood Culture results may not be optimal due to an inadequate volume of blood received in culture bottles Performed at [facility] [address], [place], [place] [address]    Culture PENDING  Incomplete   Report Status PENDING  Incomplete         Radiology Studies: CT Head Wo Contrast  Result Date: 10/22/2020 CLINICAL DATA:  Altered mental status. EXAM: CT HEAD WITHOUT CONTRAST TECHNIQUE: Contiguous axial images were obtained from the base of the skull through the vertex without intravenous contrast. COMPARISON:  None. FINDINGS: Brain: No evidence of acute large vascular territory infarction, hemorrhage, hydrocephalus, extra-axial collection or mass lesion/mass effect. There are few foci of white matter hypoattenuation which likely reflect chronic ischemic microvascular disease. Vascular: No hyperdense vessel. Atherosclerotic calcifications of the  internal carotid arteries at the skull base. Skull: Normal. Negative for fracture or focal lesion. Sinuses/Orbits: The paranasal sinuses and mastoid air cells are predominantly clear. Prior lens surgery. Other: Dental hardware. IMPRESSION: 1. No acute intracranial findings. 2. Mild chronic ischemic microvascular disease. Electronically Signed   By: Dahlia Bailiff MD   On: 10/22/2020 18:00   DG Chest Port 1 View  Result Date: 10/22/2020 CLINICAL DATA:  Questionable sepsis - evaluate for abnormality Patient reports back pain. EXAM: PORTABLE CHEST 1 VIEW COMPARISON:  None. FINDINGS: Lung volumes are low. The heart is enlarged. Aortic atherosclerosis.  No focal airspace disease. No pulmonary edema, pleural effusion, or pneumothorax. Surgical clips in the right axilla. External artifact extends external to the patient in the supraclavicular regions. IMPRESSION: Low lung volumes without acute pulmonary process. Cardiomegaly. Electronically Signed   By: Keith Rake M.D.   On: 10/22/2020 18:31   CT Renal Stone Study  Result Date: 10/22/2020 CLINICAL DATA:  Flank pain, kidney stone suspected. EXAM: CT ABDOMEN AND PELVIS WITHOUT CONTRAST TECHNIQUE: Multidetector CT imaging of the abdomen and pelvis was performed following the standard protocol without IV contrast. COMPARISON:  None. FINDINGS: Lower chest: Bronchial wall thickening with patchy nodular ground-glass opacities in the right middle lobe and lingula. Hypoventilatory change in the dependent lungs. Cardiac enlargement. Coronary artery calcifications. Small pericardial effusion, likely physiologic. Hepatobiliary: There are few hypodense hepatic lesions the largest of which measures 1 cm in the hepatic dome on image 10/4 difficult to characterize due to size and respiratory motion but favored represent cysts. Otherwise unremarkable noncontrast appearance of the hepatic parenchyma. Gallbladder surgically absent. No biliary ductal dilation. Pancreas: Fatty  pancreatic atrophy.  No discrete ductal dilation. Spleen: Normal in size without focal abnormality. Adrenals/Urinary Tract: Bilateral adrenal glands are unremarkable. No hydronephrosis. Atrophic kidneys. 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 2.8 cm lesion in the upper pole of the left kidney with density greater than that expected for a simple renal cyst but incompletely evaluated without IV contrast on image 31/4. 4 mm calcification along the inferior aspect of the lesion which may represent calcifications in the wall of the lesion or a small renal stone. No right-sided renal stones. No ureteral or bladder calculi visualized. Urinary bladder is decompressed limiting evaluation. Stomach/Bowel: Small hiatal hernia. Changes of prior gastric banding with reservoir in the anterior abdominal wall. Stomach is grossly unremarkable for degree of distension. Fluid-filled loops of small bowel without dilation. Appendix is grossly unremarkable. Fluid throughout the ascending and proximal transverse colon. Left-sided colonic diverticulosis without findings of acute diverticulitis. Fluid-filled rectum. Vascular/Lymphatic: Aortic atherosclerosis. No enlarged abdominal or pelvic lymph nodes. Reproductive: Status post hysterectomy. No adnexal masses. Other: No abdominopelvic ascites. Musculoskeletal: Multilevel degenerative changes spine. No acute osseous abnormality. IMPRESSION: 1. Fluid throughout nondilated small bowel, proximal colon and rectum as can be seen with diarrheal illness. 2. No hydronephrosis. No ureteral or bladder calculi visualized. 3. Bronchial wall thickening with patchy nodular ground-glass opacities in the right middle lobe and lingula may represent an infectious or inflammatory process, including mycobacterium avium intracellulare. 4. Left upper pole renal lesion measuring 2.8 cm with density greater than that expected for a simple renal cyst and may represent a  hemorrhagic/proteinaceous cyst but is incompletely evaluated on this single phase study without IV contrast. Further evaluation with nonemergent outpatient renal protocol CT or MRI with and without contrast is recommended. 5. 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 6. Aortic atherosclerosis. Aortic Atherosclerosis (ICD10-I70.0). Electronically Signed   By: Dahlia Bailiff MD   On: 10/22/2020 18:16        Scheduled Meds: . Chlorhexidine Gluconate Cloth  6 each Topical Daily  . enoxaparin (LOVENOX) injection  30 mg Subcutaneous Q24H  . insulin aspart  0-9 Units Subcutaneous Q4H  . sodium chloride flush  3 mL Intravenous Q12H  . vancomycin variable dose per unstable renal function (pharmacist dosing)   Does not apply See admin instructions   Continuous Infusions: . ceFEPime (MAXIPIME) IV    . sodium chloride  LOS: 1 day        Hosie Poisson, MD [facility]   To contact the attending provider between 7A-7P or the covering provider during after hours 7P-7A, please log into the web site [email or web address] and access using universal [place] password for that web site. If you do not have the password, please call the hospital operator.  10/23/2020, 3:06 PM

## 2020-10-24 LAB — GLUCOSE, CAPILLARY
Glucose-Capillary: 107 mg/dL — ABNORMAL HIGH (ref 70–99)
Glucose-Capillary: 109 mg/dL — ABNORMAL HIGH (ref 70–99)
Glucose-Capillary: 102 mg/dL — ABNORMAL HIGH (ref 70–99)
Glucose-Capillary: 128 mg/dL — ABNORMAL HIGH (ref 70–99)
Glucose-Capillary: 96 mg/dL (ref 70–99)
Glucose-Capillary: 114 mg/dL — ABNORMAL HIGH (ref 70–99)

## 2020-10-24 LAB — CBC WITH DIFFERENTIAL/PLATELET
Abs Immature Granulocytes: 0.05 10*3/uL (ref 0.00–0.07)
Basophils Absolute: 0 10*3/uL (ref 0.0–0.1)
Basophils Relative: 0 %
Eosinophils Absolute: 0.2 10*3/uL (ref 0.0–0.5)
Eosinophils Relative: 3 %
HCT: 31.1 % — ABNORMAL LOW (ref 36.0–46.0)
Hemoglobin: 9.6 g/dL — ABNORMAL LOW (ref 12.0–15.0)
Immature Granulocytes: 1 %
Lymphocytes Relative: 13 %
Lymphs Abs: 1.2 10*3/uL (ref 0.7–4.0)
MCH: 26.7 pg (ref 26.0–34.0)
MCHC: 30.9 g/dL (ref 30.0–36.0)
MCV: 86.4 fL (ref 80.0–100.0)
Monocytes Absolute: 0.7 10*3/uL (ref 0.1–1.0)
Monocytes Relative: 7 %
Neutro Abs: 7.1 10*3/uL (ref 1.7–7.7)
Neutrophils Relative %: 76 %
Platelets: 243 10*3/uL (ref 150–400)
RBC: 3.6 MIL/uL — ABNORMAL LOW (ref 3.87–5.11)
RDW: 16.4 % — ABNORMAL HIGH (ref 11.5–15.5)
WBC: 9.2 10*3/uL (ref 4.0–10.5)
nRBC: 0 % (ref 0.0–0.2)

## 2020-10-24 LAB — BASIC METABOLIC PANEL
Anion gap: 7 (ref 5–15)
BUN: 26 mg/dL — ABNORMAL HIGH (ref 8–23)
CO2: 20 mmol/L — ABNORMAL LOW (ref 22–32)
Calcium: 9.2 mg/dL (ref 8.9–10.3)
Chloride: 110 mmol/L (ref 98–111)
Creatinine, Ser: 1.24 mg/dL — ABNORMAL HIGH (ref 0.44–1.00)
GFR, Estimated: 42 mL/min — ABNORMAL LOW (ref >60)
Glucose, Bld: 97 mg/dL (ref 70–99)
Potassium: 4.3 mmol/L (ref 3.5–5.1)
Sodium: 137 mmol/L (ref 135–145)

## 2020-10-24 LAB — MRSA PCR SCREENING: MRSA by PCR: NEGATIVE

## 2020-10-24 MED ORDER — LISINOPRIL 10 MG PO TABS
20.0000 mg | ORAL_TABLET | Freq: Every day | ORAL | Status: DC
  Administered 2020-10-24 – 2020-10-25 (×2): 20 mg via ORAL
  Filled 2020-10-24 (×2): qty 2

## 2020-10-24 MED ORDER — HYDRALAZINE HCL 25 MG PO TABS
25.0000 mg | ORAL_TABLET | Freq: Four times a day (QID) | ORAL | Status: DC | PRN
  Administered 2020-10-25: 25 mg via ORAL
  Filled 2020-10-24 (×2): qty 1

## 2020-10-24 MED ORDER — PAROXETINE HCL 20 MG PO TABS
20.0000 mg | ORAL_TABLET | Freq: Every day | ORAL | Status: DC
  Administered 2020-10-24 – 2020-10-25 (×2): 20 mg via ORAL
  Filled 2020-10-24 (×2): qty 1

## 2020-10-24 MED ORDER — ENOXAPARIN SODIUM 40 MG/0.4ML ~~LOC~~ SOLN: 40.0000 mg | SUBCUTANEOUS | Status: DC

## 2020-10-24 NOTE — Evaluation (Signed)
Clinical/Bedside Swallow Evaluation Patient Details  Name: Lindsay Martinez MRN: [id number] Date of Birth: 1931-01-01  Today's Date: 10/24/2020 Time: SLP Start Time (ACUTE ONLY): 0948 SLP Stop Time (ACUTE ONLY): 1001 SLP Time Calculation (min) (ACUTE ONLY): 13 min  Past Medical History:  Past Medical History:  Diagnosis Date  . Memory impairment    Past Surgical History:  Past Surgical History:  Procedure Laterality Date  . LAPAROSCOPIC GASTRIC BANDING  2011   HPI:  Pt is an 87 yo female with AMS admitted with sepsis from UTI and possible RML PNA. CT Head negative. CXR with low lung volumes but no acute pulmonary process, although CT renal stone study revealed patchy nodular ground-glass opacities in the RML. PMH includes: dementia, HTN, HLD, depression, GERD, DM, gastric banding   Assessment / Plan / Recommendation Clinical Impression  Pt's oropharyngeal swallowing appears to be [facility], and she has no overt s/s of aspiration. She denies any h/o dysphagia or current symptoms. Recommend continuing with regular solids and thin liquids. SLP to sign off acutely. SLP Visit Diagnosis: Dysphagia, unspecified (R13.10)    Aspiration Risk  No limitations    Diet Recommendation Regular;Thin liquid   Liquid Administration via: Cup;Straw Medication Administration: Whole meds with liquid Supervision: Patient able to self feed;Intermittent supervision to cue for compensatory strategies Compensations: Slow rate;Small sips/bites Postural Changes: Seated upright at 90 degrees    Other  Recommendations Oral Care Recommendations: Oral care BID   Follow up Recommendations None      Frequency and Duration            Prognosis Prognosis for Safe Diet Advancement: Good      Swallow Study   General HPI: Pt is an 87 yo female with AMS admitted with sepsis from UTI and possible RML PNA. CT Head negative. CXR with low lung volumes but no acute pulmonary process, although CT renal stone study  revealed patchy nodular ground-glass opacities in the RML. PMH includes: dementia, HTN, HLD, depression, GERD, DM, gastric banding Type of Study: Bedside Swallow Evaluation Previous Swallow Assessment: none in chart Diet Prior to this Study: Regular;Thin liquids Temperature Spikes Noted: No Respiratory Status: Room air History of Recent Intubation: No Behavior/Cognition: Alert;Cooperative;Pleasant mood Oral Cavity Assessment: Within Functional Limits Oral Care Completed by SLP: No Oral Cavity - Dentition: Missing dentition;Other (Comment) (top dentures at home, natural lower dentition) Vision: Functional for self-feeding Self-Feeding Abilities: Able to feed self Patient Positioning: Upright in bed Baseline Vocal Quality: Normal Volitional Cough: Strong Volitional Swallow: Able to elicit    Oral/Motor/Sensory Function Overall Oral Motor/Sensory Function: Within functional limits   Ice Chips Ice chips: Not tested   Thin Liquid Thin Liquid: Within functional limits Presentation: Cup;Self Fed;Straw    Nectar Thick Nectar Thick Liquid: Not tested   Honey Thick Honey Thick Liquid: Not tested   Puree Puree: Within functional limits Presentation: Self Fed;Spoon   Solid     Solid: Within functional limits Presentation: Self Fed      Osie Bond., M.A. [place] Pager [phone] Office [phone]  10/24/2020,10:19 AM

## 2020-10-24 NOTE — Evaluation (Signed)
Physical Therapy Evaluation Patient Details Name: Lindsay Martinez MRN: [id number] DOB: 06-30-1931 Today's Date: 10/24/2020   History of Present Illness  Patient is a 87 y/o female who presents on 10/23/20 with AMS. Admitted with sepsis secondary to UTI and possible RML PNA. PMH includes dementia (undiagnosed?), HTN, depression, DM.  Clinical Impression  Patient presents with generalized weakness, dyspnea on exertion, impaired balance, impaired cognition, decreased activity tolerance and impaired mobility s/p above. Pt lives alone and reports being Mod I for ADLs and walking PTA. Likely a furniture walker based on assessment today and declines using RW for support. Daughter lives around the block and takes her out to eat twice/week. Today, pt tolerated transfers and gait training with Min guard-Min A for balance/safety with pt holding onto IV pole for support. Noted to have 3/4 DOE but VSS on RA with SP02 >95%. Pt would really benefit from using RW vs rollator for support to decrease fall risk. Will follow acutely to maximize independence and mobility prior to return home. Will try rollator next session if pt willing.    Follow Up Recommendations Home health PT;Supervision - Intermittent    Equipment Recommendations  Rolling walker with 5" wheels (however pt does not like to use it)    Recommendations for Other Services OT consult     Precautions / Restrictions Precautions Precautions: Fall Restrictions Weight Bearing Restrictions: No      Mobility  Bed Mobility Overal bed mobility: Needs Assistance Bed Mobility: Supine to Sit;Sit to Supine     Supine to sit: Supervision;HOB elevated Sit to supine: Supervision;HOB elevated   General bed mobility comments: Performed x2 with use of rail for support.    Transfers Overall transfer level: Needs assistance Equipment used: None Transfers: Sit to/from Stand Sit to Stand: Min guard         General transfer comment: Min guard for  safety with pt reaching for counter for support. Stood from [employer], from toilet x1.  Ambulation/Gait Ambulation/Gait assistance: Min guard;Min assist Gait Distance (Feet): 120 Feet (+15' + 15') Assistive device: IV Pole Gait Pattern/deviations: Step-through pattern;Decreased stride length;Trunk flexed;Drifts right/left Gait velocity: decreased Gait velocity interpretation: 1.31 - 2.62 ft/sec, indicative of limited community ambulator General Gait Details: Unsteady gait reaching for IV pole and rail for support, 3/4 DOE. Poor self awareness and monitoring of symptoms. SP02 remained 95% on RA throughout.  Stairs            Wheelchair Mobility    Modified Rankin (Stroke Patients Only)       Balance Overall balance assessment: Needs assistance Sitting-balance support: Feet supported;No upper extremity supported Sitting balance-Leahy Scale: Good     Standing balance support: During functional activity Standing balance-Leahy Scale: Poor Standing balance comment: Requires UE support in standing esp for dynamic taks.                             Pertinent Vitals/Pain Pain Assessment: No/denies pain    Home Living Family/patient expects to be discharged to:: Private residence Living Arrangements: Alone Available Help at Discharge: Family;Available PRN/intermittently Type of Home: Other(Comment) (condo) Home Access: Elevator     Home Layout: One level Home Equipment: None      Prior Function Level of Independence: Independent         Comments: Does own ADLs, does not drive. Does minimal cooking. Appears to be a furniture walker. Daughter takes her out to eat twice/week. Reports no falls.  Hand Dominance        Extremity/Trunk Assessment   Upper Extremity Assessment Upper Extremity Assessment: Defer to OT evaluation    Lower Extremity Assessment Lower Extremity Assessment: Generalized weakness (but functional)    Cervical / Trunk  Assessment Cervical / Trunk Assessment: Kyphotic  Communication   Communication: No difficulties  Cognition Arousal/Alertness: Awake/alert Behavior During Therapy: Impulsive Overall Cognitive Status: No family/caregiver present to determine baseline cognitive functioning Area of Impairment: Memory;Safety/judgement;Attention;Problem solving                   Current Attention Level: Selective Memory: Decreased short-term memory   Safety/Judgement: Decreased awareness of safety;Decreased awareness of deficits   Problem Solving: Requires verbal cues General Comments: Tangential; poor attention but able to be redirected. Poor awareness of safety- obviously needs UE support as pt reaching for furniture in room but refuses to use RW. unsure of baseline?      General Comments General comments (skin integrity, edema, etc.): VSS on RA however very SOB.    Exercises     Assessment/Plan    PT Assessment Patient needs continued PT services  PT Problem List Decreased strength;Decreased mobility;Decreased balance;Cardiopulmonary status limiting activity;Decreased cognition;Decreased activity tolerance;Decreased safety awareness       PT Treatment Interventions Therapeutic exercise;DME instruction;Gait training;Patient/family education;Therapeutic activities;Functional mobility training;Cognitive remediation;Balance training    PT Goals (Current goals can be found in the Care Plan section)  Acute Rehab PT Goals Patient Stated Goal: to go home PT Goal Formulation: With patient Time For Goal Achievement: 11/07/20 Potential to Achieve Goals: Good    Frequency Min 3X/week   Barriers to discharge Decreased caregiver support lives alone    Co-evaluation               AM-PAC PT "6 Clicks" Mobility  Outcome Measure Help needed turning from your back to your side while in a flat bed without using bedrails?: A Little Help needed moving from lying on your back to sitting on the  side of a flat bed without using bedrails?: A Little Help needed moving to and from a bed to a chair (including a wheelchair)?: A Little Help needed standing up from a chair using your arms (e.g., wheelchair or bedside chair)?: A Little Help needed to walk in hospital room?: A Little Help needed climbing 3-5 steps with a railing? : A Little 6 Click Score: 18    End of Session Equipment Utilized During Treatment: Gait belt Activity Tolerance: Patient limited by fatigue;Other (comment) (DOE) Patient left: in bed;with call bell/phone within reach;with bed alarm set Nurse Communication: Mobility status PT Visit Diagnosis: Unsteadiness on feet (R26.81);Difficulty in walking, not elsewhere classified (R26.2);Other (comment) (DOE)    Time: 8101-7510 PT Time Calculation (min) (ACUTE ONLY): 20 min   Charges:   PT Evaluation $PT Eval Moderate Complexity: 1 Mod          Marisa Severin, PT, DPT Acute Rehabilitation Services Pager [phone] Office [place] 10/24/2020, 3:41 PM

## 2020-10-24 NOTE — Progress Notes (Signed)
MD aware, of pt BP 093-235 systolic. Orders were received. Will monitor patient. Trinty Marken, Bettina Gavia RN

## 2020-10-24 NOTE — Progress Notes (Signed)
PROGRESS NOTE    Kandice RobinsonsWanda L Martinez  VHQ:[id number] DOB: January 23, 1931 DOA: 10/22/2020 PCP: Pcp, No    Chief Complaint  Patient presents with  . Altered Mental Status    Brief Narrative:  Lindsay BraceWanda L Martinez is a 87 y.o. female with medical history significant of dementia, overactive bladder, hypertension, hyperlipidemia, depression, GERD, diabetes who presents with altered mental status.  She was found to have E coli UTI, and CT renal study showed patchy nodular ground-glass opacities in the right middle lobe and lingula. She is being treated for sepsis from UTI and RML pneumonia.  Pt seen and examined today, she is alert and oriented and answering all questions appropriately.   Assessment & Plan:   Principal Problem:   Sepsis due to urinary tract infection (HCC) Active Problems:   Overactive bladder   HTN (hypertension)   HLD (hyperlipidemia)   Depression   GERD (gastroesophageal reflux disease)   Diabetes (HCC)   Sepsis secondary to UTI and possible right middle lobe pneumonia.  Started the patient on broad spectrum IV antibiotics.  Urine and blood cultures ordered and pending. Urine cultures growing E coli, sensitivities are pending.  SLP evalution to evaluate for aspiration.  She is afebrile and wbc has normalized.  Pro  calcitonin is 0.20  Anemia of chronic disease:  Hemoglobin around 9 and stable.    Hypertension:  Suboptimally controlled. Restarted lisinopril.    Hyperlipidemia:  Resume statin on discharge.   GERD  Stable.    Diabetes Mellitus:  Type 2 , non insulin dependent and A1c is 6.7.  Continue with SSI.  CBG (last 3)  Recent Labs    10/24/20 0807 10/24/20 1129 10/24/20 1529  GLUCAP 102* 128* 96   No change in meds.    H/o depression:  Resume paxil.     AKI on stage 3 b CKD. Baseline creatinine appears to be around 1.4 Admitted with a creatinine of 2, improved to 1.4 with IV fluids.  Avoid nephrotoxins.  No hydronephrosis on CT renal  study.  Continue with IV fluids.    Acute metabolic encephalopathy secondary to AKI and UTI.  At baseline she is alert and oriented,, currently back to baseline.     DVT prophylaxis: (Lovenox)) Code Status: (Full code.) Family Communication: none at bedside. Called daughter , couldn't reach her.  Disposition:   Status is: Inpatient  Remains inpatient appropriate because:Ongoing diagnostic testing needed not appropriate for outpatient work up and IV treatments appropriate due to intensity of illness or inability to take PO   Dispo: The patient is from: Home              Anticipated d/c is to: pending              Patient currently is not medically stable to d/c.   Difficult to place patient No       Consultants:   none   Procedures:  CT renal study shows Fluid throughout nondilated small bowel, proximal colon and rectum as can be seen with diarrheal illness.  No hydronephrosis. No ureteral or bladder calculi visualized.  Bronchial wall thickening with patchy nodular ground-glass opacities in the right middle lobe and lingula may represent an infectious or inflammatory process, including mycobacterium avium intracellulare.  Left upper pole renal lesion measuring 2.8 cm with density greater than that expected for a simple renal cyst and may represent a hemorrhagic/proteinaceous cyst but is incompletely evaluated on this single phase study without IV contrast. Further evaluation with nonemergent outpatient  renal protocol CT or MRI with and without  contrast is recommended.  Antimicrobials:  Antibiotics Given (last 72 hours)    Date/Time Action Medication Dose Rate   10/22/20 1859 New Bag/Given   ceFEPIme (MAXIPIME) 2 g in sodium chloride 0.9 % 100 mL IVPB 2 g 200 mL/hr   10/22/20 1942 New Bag/Given   metroNIDAZOLE (FLAGYL) IVPB 500 mg 500 mg 100 mL/hr   10/22/20 2054 New Bag/Given   vancomycin (VANCOREADY) IVPB 1500 mg/300 mL 1,500 mg 150 mL/hr   10/23/20 0041  New Bag/Given   metroNIDAZOLE (FLAGYL) IVPB 500 mg 500 mg 100 mL/hr   10/23/20 0910 New Bag/Given   metroNIDAZOLE (FLAGYL) IVPB 500 mg 500 mg 100 mL/hr   10/23/20 1734 New Bag/Given   ceFEPIme (MAXIPIME) 2 g in sodium chloride 0.9 % 100 mL IVPB 2 g 200 mL/hr   10/23/20 1823 New Bag/Given   vancomycin (VANCOREADY) IVPB 500 mg/100 mL 500 mg 100 mL/hr   10/24/20 1704 New Bag/Given   ceFEPIme (MAXIPIME) 2 g in sodium chloride 0.9 % 100 mL IVPB 2 g 200 mL/hr         Subjective: No chest pain or sob, no nausea, or vomiting.   Objective: Vitals:   10/24/20 0807 10/24/20 1121 10/24/20 1530 10/24/20 1700  BP: (!) 164/83 (!) 156/82 (!) 182/82 (!) 179/72  Pulse: 86 85 85   Resp:      Temp: 98 F (36.7 C) 97.9 F (36.6 C)    TempSrc: Oral Oral    SpO2: 98% 97% 96%   Weight:      Height:        Intake/Output Summary (Last 24 hours) at 10/24/2020 1738 Last data filed at 10/24/2020 1249 Gross per 24 hour  Intake 1081.81 ml  Output 725 ml  Net 356.81 ml   Filed Weights   10/22/20 2027 10/24/20 0324  Weight: 90.7 kg 92.8 kg    Examination:  General exam: alert and comfortable.  Respiratory system: clear to auscultation, no wheezing heard.  Cardiovascular system: S1s2 heard, RRR, no JVD,  Gastrointestinal system: Abdomen is soft, non tender non distended bowel sounds wnl Central nervous system: alert and oriented, and answering all questions appropriately.  Extremities: no pedal edema.  Skin: No rashes, Psychiatry: Mood is appropriate.     Data Reviewed: I have personally reviewed following labs and imaging studies  CBC: Recent Labs  Lab 10/22/20 2041 10/23/20 0224 10/24/20 1134  WBC 19.8* 16.3* 9.2  NEUTROABS 17.4*  --  7.1  HGB 11.2* 9.8* 9.6*  HCT 36.4 33.8* 31.1*  MCV 87.7 91.6 86.4  PLT 297 229 132    Basic Metabolic Panel: Recent Labs  Lab 10/22/20 2041 10/23/20 0224 10/24/20 1002  NA 138 139 137  K 4.6 4.3 4.3  CL 107 114* 110  CO2 21* 18* 20*   GLUCOSE 125* 105* 97  BUN 36* 36* 26*  CREATININE 2.10* 1.73* 1.24*  CALCIUM 9.9 8.9 9.2    GFR: Estimated Creatinine Clearance: 35.3 mL/min (A) (by C-G formula based on SCr of 1.24 mg/dL (H)).  Liver Function Tests: Recent Labs  Lab 10/22/20 2041 10/23/20 0224  AST 30 21  ALT 20 14  ALKPHOS 65 56  BILITOT 0.6 0.5  PROT 6.0* 4.9*  ALBUMIN 3.4* 2.7*    CBG: Recent Labs  Lab 10/24/20 0059 10/24/20 0415 10/24/20 0807 10/24/20 1129 10/24/20 1529  GLUCAP 107* 109* 102* 128* 96     Recent Results (from the past 240 hour(s))  Urine culture     Status: Abnormal (Preliminary result)   Collection Time: 10/22/20  6:50 PM   Specimen: In/Out Cath Urine  Result Value Ref Range Status   Specimen Description IN/OUT CATH URINE  Final   Special Requests NONE  Final   Culture (A)  Final    >=100,000 COLONIES/mL ESCHERICHIA COLI SUSCEPTIBILITIES TO FOLLOW Performed at [facility], [place] [address], [place], [place] [address]    Report Status PENDING  Incomplete  Resp Panel by RT-PCR (Flu A&B, Covid) Nasopharyngeal Swab     Status: None   Collection Time: 10/22/20  7:55 PM   Specimen: Nasopharyngeal Swab; Nasopharyngeal(NP) swabs in vial transport medium  Result Value Ref Range Status   SARS Coronavirus 2 by RT PCR NEGATIVE NEGATIVE Final    Comment: (NOTE) SARS-CoV-2 target nucleic acids are NOT DETECTED.  The SARS-CoV-2 RNA is generally detectable in upper respiratory specimens during the acute phase of infection. The lowest concentration of SARS-CoV-2 viral copies this assay can detect is 138 copies/mL. A negative result does not preclude SARS-Cov-2 infection and should not be used as the sole basis for treatment or other patient management decisions. A negative result may occur with  improper specimen collection/handling, submission of specimen other than nasopharyngeal swab, presence of viral mutation(s) within the areas targeted by this assay, and inadequate  number of viral copies(<138 copies/mL). A negative result must be combined with clinical observations, patient history, and epidemiological information. The expected result is Negative.  Fact Sheet for Patients:  EntrepreneurPulse.com.au  Fact Sheet for Healthcare Providers:  IncredibleEmployment.be  This test is no t yet approved or cleared by the [place] FDA and  has been authorized for detection and/or diagnosis of SARS-CoV-2 by FDA under an Emergency Use Authorization (EUA). This EUA will remain  in effect (meaning this test can be used) for the duration of the COVID-19 declaration under Section 564(b)(1) of the Act, 21 U.S.C.section 360bbb-3(b)(1), unless the authorization is terminated  or revoked sooner.       Influenza A by PCR NEGATIVE NEGATIVE Final   Influenza B by PCR NEGATIVE NEGATIVE Final    Comment: (NOTE) The Xpert Xpress SARS-CoV-2/FLU/RSV plus assay is intended as an aid in the diagnosis of influenza from Nasopharyngeal swab specimens and should not be used as a sole basis for treatment. Nasal washings and aspirates are unacceptable for Xpert Xpress SARS-CoV-2/FLU/RSV testing.  Fact Sheet for Patients: EntrepreneurPulse.com.au  Fact Sheet for Healthcare Providers: IncredibleEmployment.be  This test is not yet approved or cleared by the [place] FDA and has been authorized for detection and/or diagnosis of SARS-CoV-2 by FDA under an Emergency Use Authorization (EUA). This EUA will remain in effect (meaning this test can be used) for the duration of the COVID-19 declaration under Section 564(b)(1) of the Act, 21 U.S.C. section 360bbb-3(b)(1), unless the authorization is terminated or revoked.  Performed at [facility], [place] [address], [place], [place] [address]   Blood Culture (routine x 2)     Status: None (Preliminary result)   Collection Time: 10/23/20  2:24 AM    Specimen: BLOOD RIGHT ARM  Result Value Ref Range Status   Specimen Description BLOOD RIGHT ARM  Final   Special Requests   Final    BOTTLES DRAWN AEROBIC ONLY Blood Culture results may not be optimal due to an inadequate volume of blood received in culture bottles   Culture   Final    NO GROWTH 1 DAY Performed at  [facility], [place] [address] [place], [place] [address]    Report Status PENDING  Incomplete  Blood Culture (routine x 2)     Status: None (Preliminary result)   Collection Time: 10/23/20  2:24 AM   Specimen: BLOOD RIGHT ARM  Result Value Ref Range Status   Specimen Description BLOOD RIGHT ARM  Final   Special Requests   Final    BOTTLES DRAWN AEROBIC ONLY Blood Culture results may not be optimal due to an inadequate volume of blood received in culture bottles   Culture   Final    NO GROWTH 1 DAY Performed at [facility], [place] [address] [place], [place] [address]    Report Status PENDING  Incomplete  MRSA PCR Screening     Status: None   Collection Time: 10/24/20  9:42 AM   Specimen: Nasopharyngeal  Result Value Ref Range Status   MRSA by PCR NEGATIVE NEGATIVE Final    Comment:        The GeneXpert MRSA Assay (FDA approved for NASAL specimens only), is one component of a comprehensive MRSA colonization surveillance program. It is not intended to diagnose MRSA infection nor to guide or monitor treatment for MRSA infections. Performed at [facility], [place] [address] [place],  [address]          Radiology Studies: CT Head Wo Contrast  Result Date: 10/22/2020 CLINICAL DATA:  Altered mental status. EXAM: CT HEAD WITHOUT CONTRAST TECHNIQUE: Contiguous axial images were obtained from the base of the skull through the vertex without intravenous contrast. COMPARISON:  None. FINDINGS: Brain: No evidence of acute large vascular territory infarction, hemorrhage, hydrocephalus, extra-axial collection or mass lesion/mass effect. There are  few foci of white matter hypoattenuation which likely reflect chronic ischemic microvascular disease. Vascular: No hyperdense vessel. Atherosclerotic calcifications of the internal carotid arteries at the skull base. Skull: Normal. Negative for fracture or focal lesion. Sinuses/Orbits: The paranasal sinuses and mastoid air cells are predominantly clear. Prior lens surgery. Other: Dental hardware. IMPRESSION: 1. No acute intracranial findings. 2. Mild chronic ischemic microvascular disease. Electronically Signed   By: Dahlia Bailiff MD   On: 10/22/2020 18:00   DG Chest Port 1 View  Result Date: 10/22/2020 CLINICAL DATA:  Questionable sepsis - evaluate for abnormality Patient reports back pain. EXAM: PORTABLE CHEST 1 VIEW COMPARISON:  None. FINDINGS: Lung volumes are low. The heart is enlarged. Aortic atherosclerosis. No focal airspace disease. No pulmonary edema, pleural effusion, or pneumothorax. Surgical clips in the right axilla. External artifact extends external to the patient in the supraclavicular regions. IMPRESSION: Low lung volumes without acute pulmonary process. Cardiomegaly. Electronically Signed   By: Keith Rake M.D.   On: 10/22/2020 18:31   CT Renal Stone Study  Result Date: 10/22/2020 CLINICAL DATA:  Flank pain, kidney stone suspected. EXAM: CT ABDOMEN AND PELVIS WITHOUT CONTRAST TECHNIQUE: Multidetector CT imaging of the abdomen and pelvis was performed following the standard protocol without IV contrast. COMPARISON:  None. FINDINGS: Lower chest: Bronchial wall thickening with patchy nodular ground-glass opacities in the right middle lobe and lingula. Hypoventilatory change in the dependent lungs. Cardiac enlargement. Coronary artery calcifications. Small pericardial effusion, likely physiologic. Hepatobiliary: There are few hypodense hepatic lesions the largest of which measures 1 cm in the hepatic dome on image 10/4 difficult to characterize due to size and respiratory motion but  favored represent cysts. Otherwise unremarkable noncontrast appearance of the hepatic parenchyma. Gallbladder surgically absent. No biliary ductal dilation.  Pancreas: Fatty pancreatic atrophy.  No discrete ductal dilation. Spleen: Normal in size without focal abnormality. Adrenals/Urinary Tract: Bilateral adrenal glands are unremarkable. No hydronephrosis. Atrophic kidneys. 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 2.8 cm lesion in the upper pole of the left kidney with density greater than that expected for a simple renal cyst but incompletely evaluated without IV contrast on image 31/4. 4 mm calcification along the inferior aspect of the lesion which may represent calcifications in the wall of the lesion or a small renal stone. No right-sided renal stones. No ureteral or bladder calculi visualized. Urinary bladder is decompressed limiting evaluation. Stomach/Bowel: Small hiatal hernia. Changes of prior gastric banding with reservoir in the anterior abdominal wall. Stomach is grossly unremarkable for degree of distension. Fluid-filled loops of small bowel without dilation. Appendix is grossly unremarkable. Fluid throughout the ascending and proximal transverse colon. Left-sided colonic diverticulosis without findings of acute diverticulitis. Fluid-filled rectum. Vascular/Lymphatic: Aortic atherosclerosis. No enlarged abdominal or pelvic lymph nodes. Reproductive: Status post hysterectomy. No adnexal masses. Other: No abdominopelvic ascites. Musculoskeletal: Multilevel degenerative changes spine. No acute osseous abnormality. IMPRESSION: 1. Fluid throughout nondilated small bowel, proximal colon and rectum as can be seen with diarrheal illness. 2. No hydronephrosis. No ureteral or bladder calculi visualized. 3. Bronchial wall thickening with patchy nodular ground-glass opacities in the right middle lobe and lingula may represent an infectious or inflammatory process, including mycobacterium  avium intracellulare. 4. Left upper pole renal lesion measuring 2.8 cm with density greater than that expected for a simple renal cyst and may represent a hemorrhagic/proteinaceous cyst but is incompletely evaluated on this single phase study without IV contrast. Further evaluation with nonemergent outpatient renal protocol CT or MRI with and without contrast is recommended. 5. 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 6. Aortic atherosclerosis. Aortic Atherosclerosis (ICD10-I70.0). Electronically Signed   By: Dahlia Bailiff MD   On: 10/22/2020 18:16        Scheduled Meds: . Chlorhexidine Gluconate Cloth  6 each Topical Daily  . enoxaparin (LOVENOX) injection  30 mg Subcutaneous Q24H  . insulin aspart  0-9 Units Subcutaneous Q4H  . sodium chloride flush  3 mL Intravenous Q12H   Continuous Infusions: . sodium chloride Stopped (10/24/20 1530)  . ceFEPime (MAXIPIME) IV 2 g (10/24/20 1704)  . sodium chloride       LOS: 2 days        Hosie Poisson, MD [facility]   To contact the attending provider between 7A-7P or the covering provider during after hours 7P-7A, please log into the web site [email or web address] and access using universal [place] password for that web site. If you do not have the password, please call the hospital operator.  10/24/2020, 5:38 PM

## 2020-10-25 LAB — BASIC METABOLIC PANEL
Anion gap: 10 (ref 5–15)
BUN: 18 mg/dL (ref 8–23)
CO2: 19 mmol/L — ABNORMAL LOW (ref 22–32)
Calcium: 9.6 mg/dL (ref 8.9–10.3)
Chloride: 107 mmol/L (ref 98–111)
Creatinine, Ser: 1.05 mg/dL — ABNORMAL HIGH (ref 0.44–1.00)
GFR, Estimated: 51 mL/min — ABNORMAL LOW (ref >60)
Glucose, Bld: 133 mg/dL — ABNORMAL HIGH (ref 70–99)
Potassium: 4 mmol/L (ref 3.5–5.1)
Sodium: 136 mmol/L (ref 135–145)

## 2020-10-25 LAB — GLUCOSE, CAPILLARY
Glucose-Capillary: 129 mg/dL — ABNORMAL HIGH (ref 70–99)
Glucose-Capillary: 135 mg/dL — ABNORMAL HIGH (ref 70–99)

## 2020-10-25 LAB — URINE CULTURE: Culture: >=100000 — AB

## 2020-10-25 MED ORDER — CEPHALEXIN 500 MG PO CAPS: 500.0000 mg | ORAL_CAPSULE | Freq: Three times a day (TID) | ORAL | 0 refills | Status: AC

## 2020-10-25 MED ORDER — CEPHALEXIN 500 MG PO CAPS
500.0000 mg | ORAL_CAPSULE | Freq: Three times a day (TID) | ORAL | Status: DC
  Administered 2020-10-25: 500 mg via ORAL
  Filled 2020-10-25: qty 1

## 2020-10-25 NOTE — Discharge Summary (Addendum)
[facility]  Physician Discharge Summary   Patient ID: Lindsay Martinez MRN: [id number] DOB/AGE: 85-Aug-1932 85 y.o.  Admit date: 10/22/2020 Discharge date: 10/26/2020  PCP: Pcp, No  DISCHARGE DIAGNOSES:  Acute UTI with severe sepsis Right middle lobe pneumonia Anemia of chronic disease Essential hypertension Hyperlipidemia Diabetes mellitus type 2 AKI on stage CKD Stage 3b  RECOMMENDATIONS FOR OUTPATIENT FOLLOW UP: 1. Follow-up with PCP in 1 week.    Home Health: PT Equipment/Devices: None  CODE STATUS: Full code  DISCHARGE CONDITION: fair  Diet recommendation: Modified carbohydrate  INITIAL HISTORY: Lindsay Martinez a 87 y.o.femalewith medical history significant ofdementia, overactive bladder, hypertension, hyperlipidemia, depression, GERD, diabetes who presents with altered mental status.  She was found to have E coli UTI, and CT renal study showed patchy nodular ground-glass opacities in the right middle lobe and lingula. She is being treated for sepsis from UTI and RML pneumonia.    HOSPITAL COURSE:   Severe Sepsis secondary to UTI and possible right middle lobe pneumonia.  Patient was started on broad-spectrum antibiotics.  Urine culture grew E. coli.  Sensitivities reviewed.  Patient was changed over to oral antibiotics.  She was back to baseline.    Anemia of chronic disease:  Stable  Essential hypertension May resume home medications  Hyperlipidemia Resume statin on discharge.   GERD  Stable.   Diabetes Mellitus, Type 2 , non insulin dependent A1c is 6.7.  Resume home medications  H/o depression:  Resume paxil.   AKI on stage CKD Stage 3b. Baseline creatinine appears to be around 1.4. Admitted with a creatinine of 2, improved to 1.0 with IV fluids. No hydronephrosis on CT renal study.   Acute metabolic encephalopathy secondary to AKI and UTI.  Improved and back to baseline with treatment.  Obesity Estimated body mass  index is 34.3 kg/m as calculated from the following:   Height as of this encounter: 5\' 4"  (1.626 m).   Weight as of this encounter: 90.6 kg.  Patient is stable.  Back to baseline.  Wants to go home.  Okay for discharge today.   PERTINENT LABS:  The results of significant diagnostics from this hospitalization (including imaging, microbiology, ancillary and laboratory) are listed below for reference.    Microbiology: Recent Results (from the past 240 hour(s))  Urine culture     Status: Abnormal   Collection Time: 10/22/20  6:50 PM   Specimen: In/Out Cath Urine  Result Value Ref Range Status   Specimen Description IN/OUT CATH URINE  Final   Special Requests   Final    NONE Performed at [facility], [address] [place], [place] [address]    Culture >=100,000 COLONIES/mL ESCHERICHIA COLI (A)  Final   Report Status 10/25/2020 FINAL  Final   Organism ID, Bacteria ESCHERICHIA COLI (A)  Final      Susceptibility   Escherichia coli - MIC*    AMPICILLIN 4 SENSITIVE Sensitive     CEFAZOLIN <=4 SENSITIVE Sensitive     CEFEPIME <=0.12 SENSITIVE Sensitive     CEFTRIAXONE <=0.25 SENSITIVE Sensitive     CIPROFLOXACIN <=0.25 SENSITIVE Sensitive     GENTAMICIN <=1 SENSITIVE Sensitive     IMIPENEM <=0.25 SENSITIVE Sensitive     NITROFURANTOIN <=16 SENSITIVE Sensitive     TRIMETH/SULFA <=20 SENSITIVE Sensitive     AMPICILLIN/SULBACTAM <=2 SENSITIVE Sensitive     PIP/TAZO <=4 SENSITIVE Sensitive     * >=100,000 COLONIES/mL ESCHERICHIA COLI  Resp Panel by RT-PCR (Flu A&B,  Covid) Nasopharyngeal Swab     Status: None   Collection Time: 10/22/20  7:55 PM   Specimen: Nasopharyngeal Swab; Nasopharyngeal(NP) swabs in vial transport medium  Result Value Ref Range Status   SARS Coronavirus 2 by RT PCR NEGATIVE NEGATIVE Final    Comment: (NOTE) SARS-CoV-2 target nucleic acids are NOT DETECTED.  The SARS-CoV-2 RNA is generally detectable in upper respiratory specimens during the acute  phase of infection. The lowest concentration of SARS-CoV-2 viral copies this assay can detect is 138 copies/mL. A negative result does not preclude SARS-Cov-2 infection and should not be used as the sole basis for treatment or other patient management decisions. A negative result may occur with  improper specimen collection/handling, submission of specimen other than nasopharyngeal swab, presence of viral mutation(s) within the areas targeted by this assay, and inadequate number of viral copies(<138 copies/mL). A negative result must be combined with clinical observations, patient history, and epidemiological information. The expected result is Negative.  Fact Sheet for Patients:  EntrepreneurPulse.com.au  Fact Sheet for Healthcare Providers:  IncredibleEmployment.be  This test is no t yet approved or cleared by the [place] FDA and  has been authorized for detection and/or diagnosis of SARS-CoV-2 by FDA under an Emergency Use Authorization (EUA). This EUA will remain  in effect (meaning this test can be used) for the duration of the COVID-19 declaration under Section 564(b)(1) of the Act, 21 U.S.C.section 360bbb-3(b)(1), unless the authorization is terminated  or revoked sooner.       Influenza A by PCR NEGATIVE NEGATIVE Final   Influenza B by PCR NEGATIVE NEGATIVE Final    Comment: (NOTE) The Xpert Xpress SARS-CoV-2/FLU/RSV plus assay is intended as an aid in the diagnosis of influenza from Nasopharyngeal swab specimens and should not be used as a sole basis for treatment. Nasal washings and aspirates are unacceptable for Xpert Xpress SARS-CoV-2/FLU/RSV testing.  Fact Sheet for Patients: EntrepreneurPulse.com.au  Fact Sheet for Healthcare Providers: IncredibleEmployment.be  This test is not yet approved or cleared by the [place] FDA and has been authorized for detection and/or diagnosis of  SARS-CoV-2 by FDA under an Emergency Use Authorization (EUA). This EUA will remain in effect (meaning this test can be used) for the duration of the COVID-19 declaration under Section 564(b)(1) of the Act, 21 U.S.C. section 360bbb-3(b)(1), unless the authorization is terminated or revoked.  Performed at [facility], [place] [address] [place], [place] [address]   Blood Culture (routine x 2)     Status: None (Preliminary result)   Collection Time: 10/23/20  2:24 AM   Specimen: BLOOD RIGHT ARM  Result Value Ref Range Status   Specimen Description BLOOD RIGHT ARM  Final   Special Requests   Final    BOTTLES DRAWN AEROBIC ONLY Blood Culture results may not be optimal due to an inadequate volume of blood received in culture bottles   Culture   Final    NO GROWTH 3 DAYS Performed at [facility], [place] [address] [place], [place] [address]    Report Status PENDING  Incomplete  Blood Culture (routine x 2)     Status: None (Preliminary result)   Collection Time: 10/23/20  2:24 AM   Specimen: BLOOD RIGHT ARM  Result Value Ref Range Status   Specimen Description BLOOD RIGHT ARM  Final   Special Requests   Final    BOTTLES DRAWN AEROBIC ONLY Blood Culture results may not be optimal due to an inadequate volume of blood  received in culture bottles   Culture   Final    NO GROWTH 3 DAYS Performed at [facility], [place] [address], [place], [place] [address]    Report Status PENDING  Incomplete  MRSA PCR Screening     Status: None   Collection Time: 10/24/20  9:42 AM   Specimen: Nasopharyngeal  Result Value Ref Range Status   MRSA by PCR NEGATIVE NEGATIVE Final    Comment:        The GeneXpert MRSA Assay (FDA approved for NASAL specimens only), is one component of a comprehensive MRSA colonization surveillance program. It is not intended to diagnose MRSA infection nor to guide or monitor treatment for MRSA infections. Performed at [facility], [place] [address], [place],  [address]      Labs:  COVID-19 Labs   Lab Results  Component Value Date   [place] NEGATIVE 10/22/2020      Basic Metabolic Panel: Recent Labs  Lab 10/22/20 2041 10/23/20 0224 10/24/20 1002 10/25/20 0234  NA 138 139 137 136  K 4.6 4.3 4.3 4.0  CL 107 114* 110 107  CO2 21* 18* 20* 19*  GLUCOSE 125* 105* 97 133*  BUN 36* 36* 26* 18  CREATININE 2.10* 1.73* 1.24* 1.05*  CALCIUM 9.9 8.9 9.2 9.6   Liver Function Tests: Recent Labs  Lab 10/22/20 2041 10/23/20 0224  AST 30 21  ALT 20 14  ALKPHOS 65 56  BILITOT 0.6 0.5  PROT 6.0* 4.9*  ALBUMIN 3.4* 2.7*   CBC: Recent Labs  Lab 10/22/20 2041 10/23/20 0224 10/24/20 1134  WBC 19.8* 16.3* 9.2  NEUTROABS 17.4*  --  7.1  HGB 11.2* 9.8* 9.6*  HCT 36.4 33.8* 31.1*  MCV 87.7 91.6 86.4  PLT 297 229 243    CBG: Recent Labs  Lab 10/24/20 1129 10/24/20 1529 10/24/20 1954 10/25/20 0027 10/25/20 0434  GLUCAP 128* 96 114* 135* 129*     IMAGING STUDIES CT Head Wo Contrast  Result Date: 10/22/2020 CLINICAL DATA:  Altered mental status. EXAM: CT HEAD WITHOUT CONTRAST TECHNIQUE: Contiguous axial images were obtained from the base of the skull through the vertex without intravenous contrast. COMPARISON:  None. FINDINGS: Brain: No evidence of acute large vascular territory infarction, hemorrhage, hydrocephalus, extra-axial collection or mass lesion/mass effect. There are few foci of white matter hypoattenuation which likely reflect chronic ischemic microvascular disease. Vascular: No hyperdense vessel. Atherosclerotic calcifications of the internal carotid arteries at the skull base. Skull: Normal. Negative for fracture or focal lesion. Sinuses/Orbits: The paranasal sinuses and mastoid air cells are predominantly clear. Prior lens surgery. Other: Dental hardware. IMPRESSION: 1. No acute intracranial findings. 2. Mild chronic ischemic microvascular disease. Electronically Signed   By: Dahlia Bailiff MD    On: 10/22/2020 18:00   DG Chest Port 1 View  Result Date: 10/22/2020 CLINICAL DATA:  Questionable sepsis - evaluate for abnormality Patient reports back pain. EXAM: PORTABLE CHEST 1 VIEW COMPARISON:  None. FINDINGS: Lung volumes are low. The heart is enlarged. Aortic atherosclerosis. No focal airspace disease. No pulmonary edema, pleural effusion, or pneumothorax. Surgical clips in the right axilla. External artifact extends external to the patient in the supraclavicular regions. IMPRESSION: Low lung volumes without acute pulmonary process. Cardiomegaly. Electronically Signed   By: Keith Rake M.D.   On: 10/22/2020 18:31   CT Renal Stone Study  Result Date: 10/22/2020 CLINICAL DATA:  Flank pain, kidney stone suspected. EXAM: CT ABDOMEN AND PELVIS WITHOUT CONTRAST TECHNIQUE: Multidetector  CT imaging of the abdomen and pelvis was performed following the standard protocol without IV contrast. COMPARISON:  None. FINDINGS: Lower chest: Bronchial wall thickening with patchy nodular ground-glass opacities in the right middle lobe and lingula. Hypoventilatory change in the dependent lungs. Cardiac enlargement. Coronary artery calcifications. Small pericardial effusion, likely physiologic. Hepatobiliary: There are few hypodense hepatic lesions the largest of which measures 1 cm in the hepatic dome on image 10/4 difficult to characterize due to size and respiratory motion but favored represent cysts. Otherwise unremarkable noncontrast appearance of the hepatic parenchyma. Gallbladder surgically absent. No biliary ductal dilation. Pancreas: Fatty pancreatic atrophy.  No discrete ductal dilation. Spleen: Normal in size without focal abnormality. Adrenals/Urinary Tract: Bilateral adrenal glands are unremarkable. No hydronephrosis. Atrophic kidneys. 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 2.8 cm lesion in the upper pole of the left kidney with density greater than that expected for a simple  renal cyst but incompletely evaluated without IV contrast on image 31/4. 4 mm calcification along the inferior aspect of the lesion which may represent calcifications in the wall of the lesion or a small renal stone. No right-sided renal stones. No ureteral or bladder calculi visualized. Urinary bladder is decompressed limiting evaluation. Stomach/Bowel: Small hiatal hernia. Changes of prior gastric banding with reservoir in the anterior abdominal wall. Stomach is grossly unremarkable for degree of distension. Fluid-filled loops of small bowel without dilation. Appendix is grossly unremarkable. Fluid throughout the ascending and proximal transverse colon. Left-sided colonic diverticulosis without findings of acute diverticulitis. Fluid-filled rectum. Vascular/Lymphatic: Aortic atherosclerosis. No enlarged abdominal or pelvic lymph nodes. Reproductive: Status post hysterectomy. No adnexal masses. Other: No abdominopelvic ascites. Musculoskeletal: Multilevel degenerative changes spine. No acute osseous abnormality. IMPRESSION: 1. Fluid throughout nondilated small bowel, proximal colon and rectum as can be seen with diarrheal illness. 2. No hydronephrosis. No ureteral or bladder calculi visualized. 3. Bronchial wall thickening with patchy nodular ground-glass opacities in the right middle lobe and lingula may represent an infectious or inflammatory process, including mycobacterium avium intracellulare. 4. Left upper pole renal lesion measuring 2.8 cm with density greater than that expected for a simple renal cyst and may represent a hemorrhagic/proteinaceous cyst but is incompletely evaluated on this single phase study without IV contrast. Further evaluation with nonemergent outpatient renal protocol CT or MRI with and without contrast is recommended. 5. 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 6. Aortic atherosclerosis. Aortic Atherosclerosis (ICD10-I70.0). Electronically Signed   By: Dahlia Bailiff MD   On: 10/22/2020 18:16    DISCHARGE EXAMINATION: Vitals:   10/25/20 0001 10/25/20 0335 10/25/20 0441 10/25/20 0744  BP: (!) 195/90 (!) 192/74  (!) 155/72  Pulse: 98   97  Resp: 16 18    Temp: 98.5 F (36.9 C) 98.5 F (36.9 C)  98.3 F (36.8 C)  TempSrc: Oral Oral  Oral  SpO2: 95% 95%  95%  Weight:   90.6 kg   Height:       General appearance: Awake alert.  In no distress Resp: Clear to auscultation bilaterally.  Normal effort Cardio: S1-S2 is normal regular.  No S3-S4.  No rubs murmurs or bruit GI: Abdomen is soft.  Nontender nondistended.  Bowel sounds are present normal.  No masses organomegaly    DISPOSITION: Home  Discharge Instructions    Call MD for:  difficulty breathing, headache or visual disturbances   Complete by: As directed    Call MD for:  extreme fatigue  Complete by: As directed    Call MD for:  persistant dizziness or light-headedness   Complete by: As directed    Call MD for:  persistant nausea and vomiting   Complete by: As directed    Call MD for:  severe uncontrolled pain   Complete by: As directed    Call MD for:  temperature >100.4   Complete by: As directed    Diet - low sodium heart healthy   Complete by: As directed    Discharge instructions   Complete by: As directed    Please be sure to follow-up with your primary care provider within 7 to 10 days.  Take your medications as prescribed.  You were cared for by a hospitalist during your hospital stay. If you have any questions about your discharge medications or the care you received while you were in the hospital after you are discharged, you can call the unit and asked to speak with the hospitalist on call if the hospitalist that took care of you is not available. Once you are discharged, your primary care physician will handle any further medical issues. Please note that NO REFILLS for any discharge medications will be authorized once you are discharged, as it is imperative that you  return to your primary care physician (or establish a relationship with a primary care physician if you do not have one) for your aftercare needs so that they can reassess your need for medications and monitor your lab values. If you do not have a primary care physician, you can call [phone] for a physician referral.   Increase activity slowly   Complete by: As directed          Allergies as of 10/25/2020   No Known Allergies     Medication List    TAKE these medications   atorvastatin 10 MG tablet Commonly known as: LIPITOR Take 10 mg by mouth daily.   cephALEXin 500 MG capsule Commonly known as: KEFLEX Take 1 capsule (500 mg total) by mouth every 8 (eight) hours for 7 days.   lisinopril 20 MG tablet Commonly known as: ZESTRIL Take 20 mg by mouth daily.   oxybutynin 5 MG tablet Commonly known as: DITROPAN Take 5 mg by mouth daily.   pantoprazole 40 MG tablet Commonly known as: PROTONIX Take 40 mg by mouth daily.   PARoxetine 20 MG tablet Commonly known as: PAXIL Take 20 mg by mouth daily.   traZODone 100 MG tablet Commonly known as: DESYREL Take 100 mg by mouth at bedtime as needed for sleep.         Follow-up Information    [facility], [facility] Follow up.   Specialty: Home Health Services Why: HHPT arranged- they will contact you to set up initial home visit by the weekend.  Contact information: [place] [address] [phone]               TOTAL DISCHARGE TIME: 35 minutes  [facility] Pager on [email or web address]  10/26/2020, 12:17 PM

## 2020-10-25 NOTE — Progress Notes (Signed)
Physical Therapy Treatment Patient Details Name: Lindsay Martinez MRN: [id number] DOB: [birth year] Today's Date: 10/25/2020    History of Present Illness Patient is a 87 y/o female who presents on 10/23/20 with AMS. Admitted with sepsis secondary to UTI and possible RML PNA. PMH includes dementia (undiagnosed?), HTN, depression, DM.    PT Comments    Pt received in chair, agreeable to therapy session and with good tolerance for hallway gait training using rollator. She needs up to min guard assist for safety using rollator and did need reminders for use of brakes, but benefits from rollator seat as she needs frequent short breaks due to dyspnea up to 3/4. Pt continues to benefit from PT services to progress toward functional mobility goals. Will plan to bring HEP handout to reinforce supine/seated exercises next session. Continue to recommend HHPT and rollator, need to confirm if she still has one at home.   Follow Up Recommendations  Home health PT;Supervision - Intermittent     Equipment Recommendations  Other (comment) (rollator (she may have one? need to confirm))    Recommendations for Other Services       Precautions / Restrictions Precautions Precautions: Fall Restrictions Weight Bearing Restrictions: No    Mobility  Bed Mobility Overal bed mobility: Needs Assistance Bed Mobility: Sit to Supine       Sit to supine: Supervision   General bed mobility comments: bed rail support utilized    Transfers Overall transfer level: Needs assistance Equipment used: None Transfers: Sit to/from Stand Sit to Stand: Min guard         General transfer comment: Min guard for safety ; from chair and rollator seat heights x4 total reps  Ambulation/Gait Ambulation/Gait assistance: Min guard Gait Distance (Feet): 50 Feet (x4 trials with seated breaks between +67ft (total 21ft including seated breaks)) Assistive device: 4-wheeled walker Gait Pattern/deviations: Step-through  pattern;Decreased stride length;Trunk flexed;Drifts right/left Gait velocity: variable; ~0.3-0.6 m/s   General Gait Details: 2-3/4 DOE. Poor self awareness and monitoring of symptoms. SP02 remained 94-98% on RA throughout.   Stairs             Wheelchair Mobility    Modified Rankin (Stroke Patients Only)       Balance Overall balance assessment: Needs assistance Sitting-balance support: Feet supported;No upper extremity supported Sitting balance-Leahy Scale: Good     Standing balance support: During functional activity Standing balance-Leahy Scale: Fair Standing balance comment: able to stand unsupported and take steps at bedside/don mask but benefits from rollator and quick to fatigue                            Cognition Arousal/Alertness: Awake/alert Behavior During Therapy: Impulsive Overall Cognitive Status: Impaired/Different from baseline Area of Impairment: Memory;Safety/judgement;Attention;Problem solving                   Current Attention Level: Selective Memory: Decreased short-term memory   Safety/Judgement: Decreased awareness of safety;Decreased awareness of deficits   Problem Solving: Requires verbal cues General Comments: Tangential but able to be redirected. Poor awareness of safety- obviously needs UE support as pt reaching for furniture in room and fair use of rollator but tends to forget which way to turn due to IV and needs reminders      Exercises General Exercises - Lower Extremity Ankle Circles/Pumps: AROM;Both;10 reps;Supine Quad Sets: AROM;Both;10 reps;Supine Heel Slides: AROM;Both;5 reps;Supine Hip ABduction/ADduction: AROM;Both;5 reps;Supine    General Comments General comments (skin integrity,  edema, etc.): HR tachy to 121 bpm with exertion, 88 bpm resting pre-mobility      Pertinent Vitals/Pain Pain Assessment: Faces Faces Pain Scale: Hurts a little bit Pain Location: back, but says it's "not too bad" at  present Pain Descriptors / Indicators: Discomfort Pain Intervention(s): Monitored during session;Premedicated before session;Repositioned    Home Living                      Prior Function            PT Goals (current goals can now be found in the care plan section) Acute Rehab PT Goals Patient Stated Goal: to go home PT Goal Formulation: With patient Time For Goal Achievement: 11/07/20 Potential to Achieve Goals: Good Progress towards PT goals: Progressing toward goals    Frequency    Min 3X/week      PT Plan Current plan remains appropriate    Co-evaluation              AM-PAC PT "6 Clicks" Mobility   Outcome Measure  Help needed turning from your back to your side while in a flat bed without using bedrails?: None Help needed moving from lying on your back to sitting on the side of a flat bed without using bedrails?: A Little Help needed moving to and from a bed to a chair (including a wheelchair)?: A Little Help needed standing up from a chair using your arms (e.g., wheelchair or bedside chair)?: A Little Help needed to walk in hospital room?: A Little Help needed climbing 3-5 steps with a railing? : A Little 6 Click Score: 19    End of Session Equipment Utilized During Treatment: Gait belt Activity Tolerance: Patient tolerated treatment well Patient left: in bed;with call bell/phone within reach;with bed alarm set Nurse Communication: Mobility status PT Visit Diagnosis: Unsteadiness on feet (R26.81);Difficulty in walking, not elsewhere classified (R26.2);Other (comment) (DOE)     Time: 4818-5631 PT Time Calculation (min) (ACUTE ONLY): 19 min  Charges:  $Gait Training: 8-22 mins                     Natalija Mavis P., PTA Acute Rehabilitation Services Pager: [phone] Office: [place] 10/25/2020, 1:20 PM

## 2020-10-25 NOTE — Evaluation (Signed)
Occupational Therapy Evaluation Patient Details Name: Lindsay Martinez MRN: [id number] DOB: [birth year] Today's Date: 10/25/2020    History of Present Illness Patient is a 87 y/o female who presents on 10/23/20 with AMS. Admitted with sepsis secondary to UTI and possible RML PNA. PMH includes dementia (undiagnosed?), HTN, depression, DM.   Clinical Impression   Pt is at Sup - Mod I with ADLs/selfcare and min guard A - sup with mobility. Pt refuses RW but reaches out for walls and furniture during ambulation. Pt eager to return home. All education completed and no further acute OT services are indicated at this time. Pt to continue with acute PT services to address functional mobility safety    Follow Up Recommendations  No OT follow up;Supervision - Intermittent    Equipment Recommendations   (rollater, RW)    Recommendations for Other Services       Precautions / Restrictions Precautions Precautions: Fall Restrictions Weight Bearing Restrictions: No      Mobility Bed Mobility Overal bed mobility: Needs Assistance Bed Mobility: Sit to Supine;Supine to Sit     Supine to sit: Supervision;HOB elevated Sit to supine: Supervision   General bed mobility comments: bed rail support utilized    Transfers Overall transfer level: Needs assistance Equipment used: None Transfers: Sit to/from Stand Sit to Stand: Min guard         General transfer comment: Min guard for safety    Balance Overall balance assessment: Needs assistance Sitting-balance support: Feet supported;No upper extremity supported Sitting balance-Leahy Scale: Good     Standing balance support: During functional activity Standing balance-Leahy Scale: Fair Standing balance comment: able to stand unsupported and take steps at bedside/don mask but benefits from rollator and quick to fatigue                           ADL either performed or assessed with clinical judgement   ADL                                          General ADL Comments: Sup - Mod I with all ADLs/selfcare, min guard A - Sup for safety with functional mobility     Vision Patient Visual Report: No change from baseline       Perception     Praxis      Pertinent Vitals/Pain Pain Assessment: Faces Faces Pain Scale: Hurts a little bit Pain Location: back Pain Descriptors / Indicators: Discomfort Pain Intervention(s): Monitored during session;Repositioned     Hand Dominance Right   Extremity/Trunk Assessment Upper Extremity Assessment Upper Extremity Assessment: Generalized weakness   Lower Extremity Assessment Lower Extremity Assessment: Defer to PT evaluation   Cervical / Trunk Assessment Cervical / Trunk Assessment: Kyphotic   Communication Communication Communication: No difficulties   Cognition Arousal/Alertness: Awake/alert Behavior During Therapy: Impulsive Overall Cognitive Status: Impaired/Different from baseline Area of Impairment: Memory;Safety/judgement;Attention;Problem solving                   Current Attention Level: Selective Memory: Decreased short-term memory   Safety/Judgement: Decreased awareness of safety;Decreased awareness of deficits   Problem Solving: Requires verbal cues General Comments: Tangential but able to be redirected. Poor awareness of safety- obviously needs UE support as pt reaching for furniture in room and fair use of rollator but tends to forget which way to turn due to  IV and needs reminders   General Comments  HR tachy to 121 bpm with exertion, 88 bpm resting pre-mobility    Exercises Exercises: General Lower Extremity General Exercises - Lower Extremity Ankle Circles/Pumps: AROM;Both;10 reps;Supine Quad Sets: AROM;Both;10 reps;Supine Heel Slides: AROM;Both;5 reps;Supine Hip ABduction/ADduction: AROM;Both;5 reps;Supine   Shoulder Instructions      Home Living Family/patient expects to be discharged to:: Private  residence Living Arrangements: Alone Available Help at Discharge: Family;Available PRN/intermittently Type of Home: Other(Comment) (condo) Home Access: Elevator     Home Layout: One level     Bathroom Shower/Tub: Tub/shower unit;Walk-in shower   Bathroom Toilet: Handicapped height     Home Equipment: None          Prior Functioning/Environment Level of Independence: Independent        Comments: Does own ADLs, does not drive. Does minimal cooking. Appears to be a furniture walker. Daughter takes her out to eat twice/week. Reports no falls.        OT Problem List: Decreased activity tolerance;Decreased cognition      OT Treatment/Interventions:      OT Goals(Current goals can be found in the care plan section) Acute Rehab OT Goals Patient Stated Goal: to go home OT Goal Formulation: With patient  OT Frequency:     Barriers to D/C:            Co-evaluation              AM-PAC OT "6 Clicks" Daily Activity     Outcome Measure Help from another person eating meals?: None Help from another person taking care of personal grooming?: None Help from another person toileting, which includes using toliet, bedpan, or urinal?: None Help from another person bathing (including washing, rinsing, drying)?: A Little Help from another person to put on and taking off regular upper body clothing?: None Help from another person to put on and taking off regular lower body clothing?: A Little 6 Click Score: 22   End of Session Equipment Utilized During Treatment: Gait belt  Activity Tolerance: Patient tolerated treatment well Patient left: in chair;with call bell/phone within reach;with chair alarm set  OT Visit Diagnosis: Muscle weakness (generalized) (M62.81)                Time: 1007-1040 OT Time Calculation (min): 33 min Charges:  OT General Charges $OT Visit: 1 Visit OT Evaluation $OT Eval Moderate Complexity: 1 Mod OT Treatments $Self Care/Home Management :  8-22 mins    Britt Bottom 10/25/2020, 1:32 PM

## 2020-10-25 NOTE — Discharge Instructions (Signed)
Urinary Tract Infection, Adult  A urinary tract infection (UTI) is an infection of any part of the urinary tract. The urinary tract includes the kidneys, ureters, bladder, and urethra. These organs make, store, and get rid of urine in the body. An upper UTI affects the ureters and kidneys. A lower UTI affects the bladder and urethra. What are the causes? Most urinary tract infections are caused by bacteria in your genital area around your urethra, where urine leaves your body. These bacteria grow and cause inflammation of your urinary tract. What increases the risk? You are more likely to develop this condition if:  You have a urinary catheter that stays in place.  You are not able to control when you urinate or have a bowel movement (incontinence).  You are female and you: ? Use a spermicide or diaphragm for birth control. ? Have low estrogen levels. ? Are pregnant.  You have certain genes that increase your risk.  You are sexually active.  You take antibiotic medicines.  You have a condition that causes your flow of urine to slow down, such as: ? An enlarged prostate, if you are female. ? Blockage in your urethra. ? A kidney stone. ? A nerve condition that affects your bladder control (neurogenic bladder). ? Not getting enough to drink, or not urinating often.  You have certain medical conditions, such as: ? Diabetes. ? A weak disease-fighting system (immunesystem). ? Sickle cell disease. ? Gout. ? Spinal cord injury. What are the signs or symptoms? Symptoms of this condition include:  Needing to urinate right away (urgency).  Frequent urination. This may include small amounts of urine each time you urinate.  Pain or burning with urination.  Blood in the urine.  Urine that smells bad or unusual.  Trouble urinating.  Cloudy urine.  Vaginal discharge, if you are female.  Pain in the abdomen or the lower back. You may also have:  Vomiting or a decreased  appetite.  Confusion.  Irritability or tiredness.  A fever or chills.  Diarrhea. The first symptom in older adults may be confusion. In some cases, they may not have any symptoms until the infection has worsened. How is this diagnosed? This condition is diagnosed based on your medical history and a physical exam. You may also have other tests, including:  Urine tests.  Blood tests.  Tests for STIs (sexually transmitted infections). If you have had more than one UTI, a cystoscopy or imaging studies may be done to determine the cause of the infections. How is this treated? Treatment for this condition includes:  Antibiotic medicine.  Over-the-counter medicines to treat discomfort.  Drinking enough water to stay hydrated. If you have frequent infections or have other conditions such as a kidney stone, you may need to see a health care provider who specializes in the urinary tract (urologist). In rare cases, urinary tract infections can cause sepsis. Sepsis is a life-threatening condition that occurs when the body responds to an infection. Sepsis is treated in the hospital with IV antibiotics, fluids, and other medicines. Follow these instructions at home: Medicines  Take over-the-counter and prescription medicines only as told by your health care provider.  If you were prescribed an antibiotic medicine, take it as told by your health care provider. Do not stop using the antibiotic even if you start to feel better. General instructions  Make sure you: ? Empty your bladder often and completely. Do not hold urine for long periods of time. ? Empty your bladder after   sex. ? Wipe from front to back after urinating or having a bowel movement if you are female. Use each tissue only one time when you wipe.  Drink enough fluid to keep your urine pale yellow.  Keep all follow-up visits. This is important.   Contact a health care provider if:  Your symptoms do not get better after 1-2  days.  Your symptoms go away and then return. Get help right away if:  You have severe pain in your back or your lower abdomen.  You have a fever or chills.  You have nausea or vomiting. Summary  A urinary tract infection (UTI) is an infection of any part of the urinary tract, which includes the kidneys, ureters, bladder, and urethra.  Most urinary tract infections are caused by bacteria in your genital area.  Treatment for this condition often includes antibiotic medicines.  If you were prescribed an antibiotic medicine, take it as told by your health care provider. Do not stop using the antibiotic even if you start to feel better.  Keep all follow-up visits. This is important. This information is not intended to replace advice given to you by your health care provider. Make sure you discuss any questions you have with your health care provider. Document Revised: 01/28/2020 Document Reviewed: 01/28/2020 Elsevier Patient Education  2021 [employer]vier Inc.  

## 2020-10-25 NOTE — TOC Transition Note (Signed)
Transition of Care (TOC) - CM/SW Discharge Note [name] RN, BSN Transitions of Care Unit 4E- RN Case Manager See Treatment Team for direct phone #    Patient Details  Name: Lindsay Martinez MRN: [id number] Date of Birth: 19-May-1931  Transition of Care [facility]) CM/SW Contact:  Dawayne Patricia, RN Phone Number: 10/25/2020, 4:01 PM   Clinical Narrative:    Pt stable for transition home today, order placed for HHPT, CM spoke with pt at bedside for transition of care needs. Per pt she recently moved here a few months ago, has a PCP that she has seen once- but does not remember name.  Choice offered for [facility] agency with list given Per CMS guidelines from medicare.gov website with star ratings (copy placed in shadow chart), per pt she does not have a preference, pt also reports she has a RW at home already.   Call made to [facility] for HHPT referral- unfortunately they are unable to accept,  Call made to [facility] with Alvis Lemmings- they are able to accept with a start of care by the weekend.     Final next level of care: [place] Barriers to Discharge: No Barriers Identified   Patient Goals and CMS Choice Patient states their goals for this hospitalization and ongoing recovery are:: return home CMS Medicare.gov Compare Post Acute Care list provided to:: Patient    Discharge Placement               Home with [facility]        Discharge Plan and Services   Discharge Planning Services: CM Consult Post Acute Care Choice: Home Health,Durable Medical Equipment          DME Arranged: [facility] Rile rolling,Patient refused services DME Agency: NA       HH Arranged: PT HH Agency: [address] Date [place]: 10/25/20 Time HH Agency Contacted: 1500 Representative spoke with at [place]: Tommi Rumps  Social Determinants of Health ([place]) Interventions     Readmission Risk Interventions Readmission Risk Prevention Plan 10/25/2020  Post Dischage Appt Complete   Medication Screening Complete  Transportation Screening Complete

## 2020-10-25 NOTE — Progress Notes (Signed)
Discharge orders received.  IV and telemetry removed, CCMD notified.  Pt family notified as well. Will be here with pt's clothes around 4pm.

## 2020-10-28 LAB — CULTURE, BLOOD (ROUTINE X 2)
Culture: NO GROWTH
Culture: NO GROWTH

## 2020-11-01 DIAGNOSIS — N390 Urinary tract infection, site not specified: Secondary | ICD-10-CM | POA: Diagnosis not present

## 2020-11-01 DIAGNOSIS — A419 Sepsis, unspecified organism: Secondary | ICD-10-CM | POA: Diagnosis not present

## 2020-11-01 DIAGNOSIS — I129 Hypertensive chronic kidney disease with stage 1 through stage 4 chronic kidney disease, or unspecified chronic kidney disease: Secondary | ICD-10-CM | POA: Diagnosis not present

## 2020-11-06 DIAGNOSIS — R5381 Other malaise: Secondary | ICD-10-CM | POA: Diagnosis not present

## 2020-11-06 DIAGNOSIS — I10 Essential (primary) hypertension: Secondary | ICD-10-CM | POA: Diagnosis not present

## 2020-11-06 DIAGNOSIS — Z09 Encounter for follow-up examination after completed treatment for conditions other than malignant neoplasm: Secondary | ICD-10-CM | POA: Diagnosis not present

## 2020-11-06 DIAGNOSIS — R413 Other amnesia: Secondary | ICD-10-CM | POA: Diagnosis not present

## 2020-11-06 DIAGNOSIS — E1169 Type 2 diabetes mellitus with other specified complication: Secondary | ICD-10-CM | POA: Diagnosis not present

## 2020-11-06 DIAGNOSIS — N1832 Chronic kidney disease, stage 3b: Secondary | ICD-10-CM | POA: Diagnosis not present

## 2020-11-06 DIAGNOSIS — N3000 Acute cystitis without hematuria: Secondary | ICD-10-CM | POA: Diagnosis not present

## 2020-11-07 DIAGNOSIS — I129 Hypertensive chronic kidney disease with stage 1 through stage 4 chronic kidney disease, or unspecified chronic kidney disease: Secondary | ICD-10-CM | POA: Diagnosis not present

## 2020-11-07 DIAGNOSIS — N390 Urinary tract infection, site not specified: Secondary | ICD-10-CM | POA: Diagnosis not present

## 2020-11-07 DIAGNOSIS — A419 Sepsis, unspecified organism: Secondary | ICD-10-CM | POA: Diagnosis not present

## 2020-11-09 DIAGNOSIS — A419 Sepsis, unspecified organism: Secondary | ICD-10-CM | POA: Diagnosis not present

## 2020-11-09 DIAGNOSIS — N390 Urinary tract infection, site not specified: Secondary | ICD-10-CM | POA: Diagnosis not present

## 2020-11-09 DIAGNOSIS — I129 Hypertensive chronic kidney disease with stage 1 through stage 4 chronic kidney disease, or unspecified chronic kidney disease: Secondary | ICD-10-CM | POA: Diagnosis not present

## 2020-11-11 DIAGNOSIS — K219 Gastro-esophageal reflux disease without esophagitis: Secondary | ICD-10-CM | POA: Diagnosis not present

## 2020-11-11 DIAGNOSIS — Z87891 Personal history of nicotine dependence: Secondary | ICD-10-CM | POA: Diagnosis not present

## 2020-11-11 DIAGNOSIS — R413 Other amnesia: Secondary | ICD-10-CM | POA: Diagnosis not present

## 2020-11-11 DIAGNOSIS — E1122 Type 2 diabetes mellitus with diabetic chronic kidney disease: Secondary | ICD-10-CM | POA: Diagnosis not present

## 2020-11-11 DIAGNOSIS — E782 Mixed hyperlipidemia: Secondary | ICD-10-CM | POA: Diagnosis not present

## 2020-11-11 DIAGNOSIS — Z96653 Presence of artificial knee joint, bilateral: Secondary | ICD-10-CM | POA: Diagnosis not present

## 2020-11-11 DIAGNOSIS — Z87440 Personal history of urinary (tract) infections: Secondary | ICD-10-CM | POA: Diagnosis not present

## 2020-11-11 DIAGNOSIS — N3281 Overactive bladder: Secondary | ICD-10-CM | POA: Diagnosis not present

## 2020-11-11 DIAGNOSIS — I129 Hypertensive chronic kidney disease with stage 1 through stage 4 chronic kidney disease, or unspecified chronic kidney disease: Secondary | ICD-10-CM | POA: Diagnosis not present

## 2020-11-11 DIAGNOSIS — F5101 Primary insomnia: Secondary | ICD-10-CM | POA: Diagnosis not present

## 2020-11-11 DIAGNOSIS — N1832 Chronic kidney disease, stage 3b: Secondary | ICD-10-CM | POA: Diagnosis not present

## 2020-11-11 DIAGNOSIS — M549 Dorsalgia, unspecified: Secondary | ICD-10-CM | POA: Diagnosis not present

## 2020-11-15 DIAGNOSIS — M549 Dorsalgia, unspecified: Secondary | ICD-10-CM | POA: Diagnosis not present

## 2020-11-15 DIAGNOSIS — Z96653 Presence of artificial knee joint, bilateral: Secondary | ICD-10-CM | POA: Diagnosis not present

## 2020-11-15 DIAGNOSIS — K219 Gastro-esophageal reflux disease without esophagitis: Secondary | ICD-10-CM | POA: Diagnosis not present

## 2020-11-15 DIAGNOSIS — N1832 Chronic kidney disease, stage 3b: Secondary | ICD-10-CM | POA: Diagnosis not present

## 2020-11-15 DIAGNOSIS — N3281 Overactive bladder: Secondary | ICD-10-CM | POA: Diagnosis not present

## 2020-11-15 DIAGNOSIS — Z87891 Personal history of nicotine dependence: Secondary | ICD-10-CM | POA: Diagnosis not present

## 2020-11-15 DIAGNOSIS — R413 Other amnesia: Secondary | ICD-10-CM | POA: Diagnosis not present

## 2020-11-15 DIAGNOSIS — I129 Hypertensive chronic kidney disease with stage 1 through stage 4 chronic kidney disease, or unspecified chronic kidney disease: Secondary | ICD-10-CM | POA: Diagnosis not present

## 2020-11-15 DIAGNOSIS — F5101 Primary insomnia: Secondary | ICD-10-CM | POA: Diagnosis not present

## 2020-11-15 DIAGNOSIS — E782 Mixed hyperlipidemia: Secondary | ICD-10-CM | POA: Diagnosis not present

## 2020-11-15 DIAGNOSIS — Z87440 Personal history of urinary (tract) infections: Secondary | ICD-10-CM | POA: Diagnosis not present

## 2020-11-15 DIAGNOSIS — E1122 Type 2 diabetes mellitus with diabetic chronic kidney disease: Secondary | ICD-10-CM | POA: Diagnosis not present

## 2020-11-20 DIAGNOSIS — R413 Other amnesia: Secondary | ICD-10-CM | POA: Diagnosis not present

## 2020-11-20 DIAGNOSIS — I129 Hypertensive chronic kidney disease with stage 1 through stage 4 chronic kidney disease, or unspecified chronic kidney disease: Secondary | ICD-10-CM | POA: Diagnosis not present

## 2020-11-20 DIAGNOSIS — M549 Dorsalgia, unspecified: Secondary | ICD-10-CM | POA: Diagnosis not present

## 2020-11-20 DIAGNOSIS — Z87440 Personal history of urinary (tract) infections: Secondary | ICD-10-CM | POA: Diagnosis not present

## 2020-11-20 DIAGNOSIS — N3281 Overactive bladder: Secondary | ICD-10-CM | POA: Diagnosis not present

## 2020-11-20 DIAGNOSIS — F5101 Primary insomnia: Secondary | ICD-10-CM | POA: Diagnosis not present

## 2020-11-20 DIAGNOSIS — K219 Gastro-esophageal reflux disease without esophagitis: Secondary | ICD-10-CM | POA: Diagnosis not present

## 2020-11-20 DIAGNOSIS — Z87891 Personal history of nicotine dependence: Secondary | ICD-10-CM | POA: Diagnosis not present

## 2020-11-20 DIAGNOSIS — N1832 Chronic kidney disease, stage 3b: Secondary | ICD-10-CM | POA: Diagnosis not present

## 2020-11-20 DIAGNOSIS — E782 Mixed hyperlipidemia: Secondary | ICD-10-CM | POA: Diagnosis not present

## 2020-11-20 DIAGNOSIS — E1122 Type 2 diabetes mellitus with diabetic chronic kidney disease: Secondary | ICD-10-CM | POA: Diagnosis not present

## 2020-11-20 DIAGNOSIS — Z96653 Presence of artificial knee joint, bilateral: Secondary | ICD-10-CM | POA: Diagnosis not present

## 2020-11-22 DIAGNOSIS — M549 Dorsalgia, unspecified: Secondary | ICD-10-CM | POA: Diagnosis not present

## 2020-11-22 DIAGNOSIS — N3281 Overactive bladder: Secondary | ICD-10-CM | POA: Diagnosis not present

## 2020-11-22 DIAGNOSIS — E782 Mixed hyperlipidemia: Secondary | ICD-10-CM | POA: Diagnosis not present

## 2020-11-22 DIAGNOSIS — I129 Hypertensive chronic kidney disease with stage 1 through stage 4 chronic kidney disease, or unspecified chronic kidney disease: Secondary | ICD-10-CM | POA: Diagnosis not present

## 2020-11-22 DIAGNOSIS — Z87440 Personal history of urinary (tract) infections: Secondary | ICD-10-CM | POA: Diagnosis not present

## 2020-11-22 DIAGNOSIS — E1122 Type 2 diabetes mellitus with diabetic chronic kidney disease: Secondary | ICD-10-CM | POA: Diagnosis not present

## 2020-11-22 DIAGNOSIS — R413 Other amnesia: Secondary | ICD-10-CM | POA: Diagnosis not present

## 2020-11-22 DIAGNOSIS — Z96653 Presence of artificial knee joint, bilateral: Secondary | ICD-10-CM | POA: Diagnosis not present

## 2020-11-22 DIAGNOSIS — K219 Gastro-esophageal reflux disease without esophagitis: Secondary | ICD-10-CM | POA: Diagnosis not present

## 2020-11-22 DIAGNOSIS — Z87891 Personal history of nicotine dependence: Secondary | ICD-10-CM | POA: Diagnosis not present

## 2020-11-22 DIAGNOSIS — N1832 Chronic kidney disease, stage 3b: Secondary | ICD-10-CM | POA: Diagnosis not present

## 2020-11-22 DIAGNOSIS — F5101 Primary insomnia: Secondary | ICD-10-CM | POA: Diagnosis not present

## 2020-11-24 DIAGNOSIS — I129 Hypertensive chronic kidney disease with stage 1 through stage 4 chronic kidney disease, or unspecified chronic kidney disease: Secondary | ICD-10-CM | POA: Diagnosis not present

## 2020-11-24 DIAGNOSIS — Z87891 Personal history of nicotine dependence: Secondary | ICD-10-CM | POA: Diagnosis not present

## 2020-11-24 DIAGNOSIS — R413 Other amnesia: Secondary | ICD-10-CM | POA: Diagnosis not present

## 2020-11-24 DIAGNOSIS — M549 Dorsalgia, unspecified: Secondary | ICD-10-CM | POA: Diagnosis not present

## 2020-11-24 DIAGNOSIS — Z87440 Personal history of urinary (tract) infections: Secondary | ICD-10-CM | POA: Diagnosis not present

## 2020-11-24 DIAGNOSIS — Z96653 Presence of artificial knee joint, bilateral: Secondary | ICD-10-CM | POA: Diagnosis not present

## 2020-11-24 DIAGNOSIS — E782 Mixed hyperlipidemia: Secondary | ICD-10-CM | POA: Diagnosis not present

## 2020-11-24 DIAGNOSIS — E1122 Type 2 diabetes mellitus with diabetic chronic kidney disease: Secondary | ICD-10-CM | POA: Diagnosis not present

## 2020-11-24 DIAGNOSIS — N3281 Overactive bladder: Secondary | ICD-10-CM | POA: Diagnosis not present

## 2020-11-24 DIAGNOSIS — F5101 Primary insomnia: Secondary | ICD-10-CM | POA: Diagnosis not present

## 2020-11-24 DIAGNOSIS — N1832 Chronic kidney disease, stage 3b: Secondary | ICD-10-CM | POA: Diagnosis not present

## 2020-11-24 DIAGNOSIS — K219 Gastro-esophageal reflux disease without esophagitis: Secondary | ICD-10-CM | POA: Diagnosis not present

## 2020-11-28 DIAGNOSIS — I129 Hypertensive chronic kidney disease with stage 1 through stage 4 chronic kidney disease, or unspecified chronic kidney disease: Secondary | ICD-10-CM | POA: Diagnosis not present

## 2020-11-28 DIAGNOSIS — K219 Gastro-esophageal reflux disease without esophagitis: Secondary | ICD-10-CM | POA: Diagnosis not present

## 2020-11-28 DIAGNOSIS — F5101 Primary insomnia: Secondary | ICD-10-CM | POA: Diagnosis not present

## 2020-11-28 DIAGNOSIS — R413 Other amnesia: Secondary | ICD-10-CM | POA: Diagnosis not present

## 2020-11-28 DIAGNOSIS — Z96653 Presence of artificial knee joint, bilateral: Secondary | ICD-10-CM | POA: Diagnosis not present

## 2020-11-28 DIAGNOSIS — Z87891 Personal history of nicotine dependence: Secondary | ICD-10-CM | POA: Diagnosis not present

## 2020-11-28 DIAGNOSIS — E782 Mixed hyperlipidemia: Secondary | ICD-10-CM | POA: Diagnosis not present

## 2020-11-28 DIAGNOSIS — M549 Dorsalgia, unspecified: Secondary | ICD-10-CM | POA: Diagnosis not present

## 2020-11-28 DIAGNOSIS — E1122 Type 2 diabetes mellitus with diabetic chronic kidney disease: Secondary | ICD-10-CM | POA: Diagnosis not present

## 2020-11-28 DIAGNOSIS — N3281 Overactive bladder: Secondary | ICD-10-CM | POA: Diagnosis not present

## 2020-11-28 DIAGNOSIS — Z87440 Personal history of urinary (tract) infections: Secondary | ICD-10-CM | POA: Diagnosis not present

## 2020-11-28 DIAGNOSIS — N1832 Chronic kidney disease, stage 3b: Secondary | ICD-10-CM | POA: Diagnosis not present

## 2020-11-29 DIAGNOSIS — F5101 Primary insomnia: Secondary | ICD-10-CM | POA: Diagnosis not present

## 2020-11-29 DIAGNOSIS — Z96653 Presence of artificial knee joint, bilateral: Secondary | ICD-10-CM | POA: Diagnosis not present

## 2020-11-29 DIAGNOSIS — R413 Other amnesia: Secondary | ICD-10-CM | POA: Diagnosis not present

## 2020-11-29 DIAGNOSIS — N3281 Overactive bladder: Secondary | ICD-10-CM | POA: Diagnosis not present

## 2020-11-29 DIAGNOSIS — E1122 Type 2 diabetes mellitus with diabetic chronic kidney disease: Secondary | ICD-10-CM | POA: Diagnosis not present

## 2020-11-29 DIAGNOSIS — I129 Hypertensive chronic kidney disease with stage 1 through stage 4 chronic kidney disease, or unspecified chronic kidney disease: Secondary | ICD-10-CM | POA: Diagnosis not present

## 2020-11-29 DIAGNOSIS — K219 Gastro-esophageal reflux disease without esophagitis: Secondary | ICD-10-CM | POA: Diagnosis not present

## 2020-11-29 DIAGNOSIS — Z87891 Personal history of nicotine dependence: Secondary | ICD-10-CM | POA: Diagnosis not present

## 2020-11-29 DIAGNOSIS — N1832 Chronic kidney disease, stage 3b: Secondary | ICD-10-CM | POA: Diagnosis not present

## 2020-11-29 DIAGNOSIS — M549 Dorsalgia, unspecified: Secondary | ICD-10-CM | POA: Diagnosis not present

## 2020-11-29 DIAGNOSIS — E782 Mixed hyperlipidemia: Secondary | ICD-10-CM | POA: Diagnosis not present

## 2020-11-29 DIAGNOSIS — Z87440 Personal history of urinary (tract) infections: Secondary | ICD-10-CM | POA: Diagnosis not present

## 2020-12-01 DIAGNOSIS — Z96653 Presence of artificial knee joint, bilateral: Secondary | ICD-10-CM | POA: Diagnosis not present

## 2020-12-01 DIAGNOSIS — M549 Dorsalgia, unspecified: Secondary | ICD-10-CM | POA: Diagnosis not present

## 2020-12-01 DIAGNOSIS — I129 Hypertensive chronic kidney disease with stage 1 through stage 4 chronic kidney disease, or unspecified chronic kidney disease: Secondary | ICD-10-CM | POA: Diagnosis not present

## 2020-12-01 DIAGNOSIS — E1122 Type 2 diabetes mellitus with diabetic chronic kidney disease: Secondary | ICD-10-CM | POA: Diagnosis not present

## 2020-12-01 DIAGNOSIS — K219 Gastro-esophageal reflux disease without esophagitis: Secondary | ICD-10-CM | POA: Diagnosis not present

## 2020-12-01 DIAGNOSIS — N1832 Chronic kidney disease, stage 3b: Secondary | ICD-10-CM | POA: Diagnosis not present

## 2020-12-01 DIAGNOSIS — R413 Other amnesia: Secondary | ICD-10-CM | POA: Diagnosis not present

## 2020-12-01 DIAGNOSIS — Z87891 Personal history of nicotine dependence: Secondary | ICD-10-CM | POA: Diagnosis not present

## 2020-12-01 DIAGNOSIS — Z87440 Personal history of urinary (tract) infections: Secondary | ICD-10-CM | POA: Diagnosis not present

## 2020-12-01 DIAGNOSIS — F5101 Primary insomnia: Secondary | ICD-10-CM | POA: Diagnosis not present

## 2020-12-01 DIAGNOSIS — E782 Mixed hyperlipidemia: Secondary | ICD-10-CM | POA: Diagnosis not present

## 2020-12-01 DIAGNOSIS — N3281 Overactive bladder: Secondary | ICD-10-CM | POA: Diagnosis not present

## 2020-12-06 DIAGNOSIS — M549 Dorsalgia, unspecified: Secondary | ICD-10-CM | POA: Diagnosis not present

## 2020-12-06 DIAGNOSIS — I129 Hypertensive chronic kidney disease with stage 1 through stage 4 chronic kidney disease, or unspecified chronic kidney disease: Secondary | ICD-10-CM | POA: Diagnosis not present

## 2020-12-06 DIAGNOSIS — E1122 Type 2 diabetes mellitus with diabetic chronic kidney disease: Secondary | ICD-10-CM | POA: Diagnosis not present

## 2020-12-06 DIAGNOSIS — N1832 Chronic kidney disease, stage 3b: Secondary | ICD-10-CM | POA: Diagnosis not present

## 2020-12-06 DIAGNOSIS — F5101 Primary insomnia: Secondary | ICD-10-CM | POA: Diagnosis not present

## 2020-12-06 DIAGNOSIS — R413 Other amnesia: Secondary | ICD-10-CM | POA: Diagnosis not present

## 2020-12-06 DIAGNOSIS — Z87891 Personal history of nicotine dependence: Secondary | ICD-10-CM | POA: Diagnosis not present

## 2020-12-06 DIAGNOSIS — K219 Gastro-esophageal reflux disease without esophagitis: Secondary | ICD-10-CM | POA: Diagnosis not present

## 2020-12-06 DIAGNOSIS — N3281 Overactive bladder: Secondary | ICD-10-CM | POA: Diagnosis not present

## 2020-12-06 DIAGNOSIS — Z96653 Presence of artificial knee joint, bilateral: Secondary | ICD-10-CM | POA: Diagnosis not present

## 2020-12-06 DIAGNOSIS — E782 Mixed hyperlipidemia: Secondary | ICD-10-CM | POA: Diagnosis not present

## 2020-12-06 DIAGNOSIS — Z87440 Personal history of urinary (tract) infections: Secondary | ICD-10-CM | POA: Diagnosis not present

## 2020-12-16 DIAGNOSIS — M549 Dorsalgia, unspecified: Secondary | ICD-10-CM | POA: Diagnosis not present

## 2020-12-16 DIAGNOSIS — R413 Other amnesia: Secondary | ICD-10-CM | POA: Diagnosis not present

## 2020-12-16 DIAGNOSIS — Z87891 Personal history of nicotine dependence: Secondary | ICD-10-CM | POA: Diagnosis not present

## 2020-12-16 DIAGNOSIS — Z87440 Personal history of urinary (tract) infections: Secondary | ICD-10-CM | POA: Diagnosis not present

## 2020-12-16 DIAGNOSIS — E1122 Type 2 diabetes mellitus with diabetic chronic kidney disease: Secondary | ICD-10-CM | POA: Diagnosis not present

## 2020-12-16 DIAGNOSIS — N3281 Overactive bladder: Secondary | ICD-10-CM | POA: Diagnosis not present

## 2020-12-16 DIAGNOSIS — K219 Gastro-esophageal reflux disease without esophagitis: Secondary | ICD-10-CM | POA: Diagnosis not present

## 2020-12-16 DIAGNOSIS — N1832 Chronic kidney disease, stage 3b: Secondary | ICD-10-CM | POA: Diagnosis not present

## 2020-12-16 DIAGNOSIS — Z96653 Presence of artificial knee joint, bilateral: Secondary | ICD-10-CM | POA: Diagnosis not present

## 2020-12-16 DIAGNOSIS — I129 Hypertensive chronic kidney disease with stage 1 through stage 4 chronic kidney disease, or unspecified chronic kidney disease: Secondary | ICD-10-CM | POA: Diagnosis not present

## 2020-12-16 DIAGNOSIS — F5101 Primary insomnia: Secondary | ICD-10-CM | POA: Diagnosis not present

## 2020-12-16 DIAGNOSIS — E782 Mixed hyperlipidemia: Secondary | ICD-10-CM | POA: Diagnosis not present

## 2020-12-25 DIAGNOSIS — N1832 Chronic kidney disease, stage 3b: Secondary | ICD-10-CM | POA: Diagnosis not present

## 2020-12-25 DIAGNOSIS — M549 Dorsalgia, unspecified: Secondary | ICD-10-CM | POA: Diagnosis not present

## 2020-12-25 DIAGNOSIS — R413 Other amnesia: Secondary | ICD-10-CM | POA: Diagnosis not present

## 2020-12-25 DIAGNOSIS — N3281 Overactive bladder: Secondary | ICD-10-CM | POA: Diagnosis not present

## 2020-12-25 DIAGNOSIS — I129 Hypertensive chronic kidney disease with stage 1 through stage 4 chronic kidney disease, or unspecified chronic kidney disease: Secondary | ICD-10-CM | POA: Diagnosis not present

## 2020-12-25 DIAGNOSIS — F5101 Primary insomnia: Secondary | ICD-10-CM | POA: Diagnosis not present

## 2020-12-25 DIAGNOSIS — E782 Mixed hyperlipidemia: Secondary | ICD-10-CM | POA: Diagnosis not present

## 2020-12-25 DIAGNOSIS — Z87891 Personal history of nicotine dependence: Secondary | ICD-10-CM | POA: Diagnosis not present

## 2020-12-25 DIAGNOSIS — K219 Gastro-esophageal reflux disease without esophagitis: Secondary | ICD-10-CM | POA: Diagnosis not present

## 2020-12-25 DIAGNOSIS — Z96653 Presence of artificial knee joint, bilateral: Secondary | ICD-10-CM | POA: Diagnosis not present

## 2020-12-25 DIAGNOSIS — Z87440 Personal history of urinary (tract) infections: Secondary | ICD-10-CM | POA: Diagnosis not present

## 2020-12-25 DIAGNOSIS — E1122 Type 2 diabetes mellitus with diabetic chronic kidney disease: Secondary | ICD-10-CM | POA: Diagnosis not present

## 2021-01-08 DIAGNOSIS — Z96653 Presence of artificial knee joint, bilateral: Secondary | ICD-10-CM | POA: Diagnosis not present

## 2021-01-08 DIAGNOSIS — K219 Gastro-esophageal reflux disease without esophagitis: Secondary | ICD-10-CM | POA: Diagnosis not present

## 2021-01-08 DIAGNOSIS — Z87891 Personal history of nicotine dependence: Secondary | ICD-10-CM | POA: Diagnosis not present

## 2021-01-08 DIAGNOSIS — N1832 Chronic kidney disease, stage 3b: Secondary | ICD-10-CM | POA: Diagnosis not present

## 2021-01-08 DIAGNOSIS — R413 Other amnesia: Secondary | ICD-10-CM | POA: Diagnosis not present

## 2021-01-08 DIAGNOSIS — N3281 Overactive bladder: Secondary | ICD-10-CM | POA: Diagnosis not present

## 2021-01-08 DIAGNOSIS — I129 Hypertensive chronic kidney disease with stage 1 through stage 4 chronic kidney disease, or unspecified chronic kidney disease: Secondary | ICD-10-CM | POA: Diagnosis not present

## 2021-01-08 DIAGNOSIS — E782 Mixed hyperlipidemia: Secondary | ICD-10-CM | POA: Diagnosis not present

## 2021-01-08 DIAGNOSIS — E1122 Type 2 diabetes mellitus with diabetic chronic kidney disease: Secondary | ICD-10-CM | POA: Diagnosis not present

## 2021-01-08 DIAGNOSIS — M549 Dorsalgia, unspecified: Secondary | ICD-10-CM | POA: Diagnosis not present

## 2021-01-08 DIAGNOSIS — Z87440 Personal history of urinary (tract) infections: Secondary | ICD-10-CM | POA: Diagnosis not present

## 2021-01-08 DIAGNOSIS — F5101 Primary insomnia: Secondary | ICD-10-CM | POA: Diagnosis not present

## 2021-01-12 ENCOUNTER — Other Ambulatory Visit: Payer: Self-pay

## 2021-01-12 ENCOUNTER — Emergency Department (HOSPITAL_COMMUNITY): Payer: Medicare Other

## 2021-01-12 ENCOUNTER — Emergency Department (HOSPITAL_COMMUNITY)
Admission: EM | Admit: 2021-01-12 | Discharge: 2021-01-12 | Disposition: A | Discharge status: Home / Self Care | Payer: Medicare Other | Attending: Emergency Medicine | Admitting: Emergency Medicine

## 2021-01-12 ENCOUNTER — Encounter (HOSPITAL_COMMUNITY): Payer: Self-pay | Admitting: Emergency Medicine

## 2021-01-12 DIAGNOSIS — M5030 Other cervical disc degeneration, unspecified cervical region: Secondary | ICD-10-CM | POA: Diagnosis not present

## 2021-01-12 DIAGNOSIS — G4489 Other headache syndrome: Secondary | ICD-10-CM | POA: Diagnosis not present

## 2021-01-12 DIAGNOSIS — W01198A Fall on same level from slipping, tripping and stumbling with subsequent striking against other object, initial encounter: Secondary | ICD-10-CM | POA: Insufficient documentation

## 2021-01-12 DIAGNOSIS — Y92018 Other place in single-family (private) house as the place of occurrence of the external cause: Secondary | ICD-10-CM | POA: Insufficient documentation

## 2021-01-12 DIAGNOSIS — I10 Essential (primary) hypertension: Secondary | ICD-10-CM | POA: Insufficient documentation

## 2021-01-12 DIAGNOSIS — Y9389 Activity, other specified: Secondary | ICD-10-CM | POA: Insufficient documentation

## 2021-01-12 DIAGNOSIS — M5134 Other intervertebral disc degeneration, thoracic region: Secondary | ICD-10-CM | POA: Diagnosis not present

## 2021-01-12 DIAGNOSIS — S022XXA Fracture of nasal bones, initial encounter for closed fracture: Secondary | ICD-10-CM | POA: Diagnosis not present

## 2021-01-12 DIAGNOSIS — Z9884 Bariatric surgery status: Secondary | ICD-10-CM | POA: Diagnosis not present

## 2021-01-12 DIAGNOSIS — E119 Type 2 diabetes mellitus without complications: Secondary | ICD-10-CM | POA: Insufficient documentation

## 2021-01-12 DIAGNOSIS — Z743 Need for continuous supervision: Secondary | ICD-10-CM | POA: Diagnosis not present

## 2021-01-12 DIAGNOSIS — M546 Pain in thoracic spine: Secondary | ICD-10-CM | POA: Insufficient documentation

## 2021-01-12 DIAGNOSIS — S8002XA Contusion of left knee, initial encounter: Secondary | ICD-10-CM | POA: Insufficient documentation

## 2021-01-12 DIAGNOSIS — S0003XA Contusion of scalp, initial encounter: Secondary | ICD-10-CM | POA: Diagnosis not present

## 2021-01-12 DIAGNOSIS — R6889 Other general symptoms and signs: Secondary | ICD-10-CM | POA: Diagnosis not present

## 2021-01-12 DIAGNOSIS — R52 Pain, unspecified: Secondary | ICD-10-CM

## 2021-01-12 DIAGNOSIS — M2578 Osteophyte, vertebrae: Secondary | ICD-10-CM | POA: Diagnosis not present

## 2021-01-12 DIAGNOSIS — S0083XA Contusion of other part of head, initial encounter: Secondary | ICD-10-CM

## 2021-01-12 DIAGNOSIS — S0990XA Unspecified injury of head, initial encounter: Secondary | ICD-10-CM | POA: Diagnosis present

## 2021-01-12 DIAGNOSIS — Z87891 Personal history of nicotine dependence: Secondary | ICD-10-CM | POA: Insufficient documentation

## 2021-01-12 DIAGNOSIS — M25562 Pain in left knee: Secondary | ICD-10-CM | POA: Diagnosis not present

## 2021-01-12 DIAGNOSIS — I499 Cardiac arrhythmia, unspecified: Secondary | ICD-10-CM | POA: Diagnosis not present

## 2021-01-12 DIAGNOSIS — T07XXXA Unspecified multiple injuries, initial encounter: Secondary | ICD-10-CM | POA: Diagnosis not present

## 2021-01-12 NOTE — ED Provider Notes (Signed)
[place] DEPT Provider Note   CSN: [id number] Arrival date & time: 01/12/21  9024     History Chief Complaint  Patient presents with   Lindsay Martinez is a 87 y.o. female.  87 year old female presented mechanical fall just prior to arrival.  Patient struck her head but did not have any LOC.  She does not take any blood thinners.  Does not have any neck discomfort.  Has not been confused or having any nausea or vomiting.  Also complains of pain to her mid thoracic back as well as her left knee.  She was able to ambulate.  No treatment use prior to arrival      Past Medical History:  Diagnosis Date   Memory impairment     Patient Active Problem List   Diagnosis Date Noted   Sepsis due to urinary tract infection ([place]) 10/22/2020   Overactive bladder 10/22/2020   HTN (hypertension) 10/22/2020   HLD (hyperlipidemia) 10/22/2020   Depression 10/22/2020   GERD (gastroesophageal reflux disease) 10/22/2020   Diabetes ([place]) 10/22/2020    Past Surgical History:  Procedure Laterality Date   LAPAROSCOPIC GASTRIC BANDING  2011     OB History   No obstetric history on file.     Family History  Family history unknown: Yes    Social History   Tobacco Use   Smoking status: Former   Smokeless tobacco: Never  Substance Use Topics   Alcohol use: Yes    Home Medications Prior to Admission medications   Medication Sig Start Date End Date Taking? Authorizing Provider  atorvastatin (LIPITOR) 10 MG tablet Take 10 mg by mouth daily. 10/19/20   [provider]  lisinopril (ZESTRIL) 20 MG tablet Take 20 mg by mouth daily. 10/19/20   [provider]  oxybutynin (DITROPAN) 5 MG tablet Take 5 mg by mouth daily.    [provider]  pantoprazole (PROTONIX) 40 MG tablet Take 40 mg by mouth daily. 10/19/20   [provider]  PARoxetine (PAXIL) 20 MG tablet Take 20 mg by mouth daily. 10/19/20   [provider]  traZODone (DESYREL) 100 MG tablet Take 100 mg by mouth at bedtime as needed for sleep. 10/19/20   [provider]    Allergies    Patient has no known allergies.  Review of Systems   Review of Systems  All other systems reviewed and are negative.  Physical Exam Updated Vital Signs BP (!) 195/95 (BP Location: Left Arm)   Pulse (!) 50   Temp 98 F (36.7 C) (Oral)   Resp 20   Ht 1.473 m (4\' 10" )   Wt 77.1 kg   SpO2 98%   BMI 35.53 kg/m   Physical Exam Vitals and nursing note reviewed.  Constitutional:      General: She is not in acute distress.    Appearance: Normal appearance. She is well-developed. She is not toxic-appearing.  HENT:     Head:   Eyes:     General: Lids are normal.     Conjunctiva/sclera: Conjunctivae normal.     Pupils: Pupils are equal, round, and reactive to light.  Neck:     Thyroid: No thyroid mass.     Trachea: No tracheal deviation.  Cardiovascular:     Rate and Rhythm: Normal rate and regular rhythm.     Heart sounds: Normal heart sounds. No murmur heard.   No gallop.  Pulmonary:     Effort:  Pulmonary effort is normal. No respiratory distress.     Breath sounds: Normal breath sounds. No stridor. No decreased breath sounds, wheezing, rhonchi or rales.  Abdominal:     General: There is no distension.     Palpations: Abdomen is soft.     Tenderness: There is no abdominal tenderness. There is no rebound.  Musculoskeletal:        General: No tenderness. Normal range of motion.     Cervical back: Normal range of motion and neck supple.     Thoracic back: Bony tenderness present.     Left knee: Bony tenderness present. Normal range of motion.       Legs:  Skin:    General: Skin is warm and dry.     Findings: No abrasion or rash.  Neurological:     General: No focal deficit present.     Mental Status: She is alert and oriented to person, place, and time. Mental status is at baseline.     GCS: GCS eye subscore is 4. GCS verbal  subscore is 5. GCS motor subscore is 6.     Cranial Nerves: Cranial nerves are intact. No cranial nerve deficit.     Sensory: No sensory deficit.     Motor: Motor function is intact.  Psychiatric:        Attention and Perception: Attention normal.        Speech: Speech normal.        Behavior: Behavior normal.    ED Results / Procedures / Treatments   Labs (all labs ordered are listed, but only abnormal results are displayed) Labs Reviewed - No data to display  EKG None  Radiology No results found.  Procedures Procedures   Medications Ordered in ED Medications - No data to display  ED Course  I have reviewed the triage vital signs and the nursing notes.  Pertinent labs & imaging results that were available during my care of the patient were reviewed by me and considered in my medical decision making (see chart for details).    MDM Rules/Calculators/A&P                          X-rays are negative.  Multiple contusions and will discharge Final Clinical Impression(s) / ED Diagnoses Final diagnoses:  None    Rx / DC Orders ED Discharge Orders     None        Lacretia Leigh, MD 01/12/21 1158

## 2021-01-12 NOTE — ED Notes (Signed)
Patient returns from xray- ice pack to forehead.  Patient is alert and talkative

## 2021-01-12 NOTE — ED Notes (Signed)
Ice pack to forehead

## 2021-01-12 NOTE — ED Triage Notes (Signed)
Patient arrives via EMS from her home.  Patient was getting up off her toilet and got entangled in her clothing.  She fell, hitting her head to the floor.  She denies LOC or being on blood thinners.  Bruising and hematoma noted to the left forehead, bridge of nose and left knee.  Patient is alert and oriented. Patient uses a walker for assist.  Daughter is at the bedside

## 2021-01-12 NOTE — ED Notes (Signed)
Patient to radiology via stretcher

## 2021-02-08 ENCOUNTER — Other Ambulatory Visit: Payer: Self-pay

## 2021-02-08 ENCOUNTER — Emergency Department (HOSPITAL_COMMUNITY): Payer: Medicare Other

## 2021-02-08 ENCOUNTER — Inpatient Hospital Stay (HOSPITAL_COMMUNITY)
Admission: EM | Admit: 2021-02-08 | Discharge: 2021-02-13 | DRG: 069 | Disposition: A | Discharge status: Rehabilitation Facility | Payer: Medicare Other | Attending: Internal Medicine | Admitting: Internal Medicine

## 2021-02-08 DIAGNOSIS — Z79899 Other long term (current) drug therapy: Secondary | ICD-10-CM

## 2021-02-08 DIAGNOSIS — I639 Cerebral infarction, unspecified: Secondary | ICD-10-CM | POA: Diagnosis not present

## 2021-02-08 DIAGNOSIS — R748 Abnormal levels of other serum enzymes: Secondary | ICD-10-CM | POA: Diagnosis not present

## 2021-02-08 DIAGNOSIS — Z743 Need for continuous supervision: Secondary | ICD-10-CM | POA: Diagnosis not present

## 2021-02-08 DIAGNOSIS — R410 Disorientation, unspecified: Secondary | ICD-10-CM | POA: Diagnosis not present

## 2021-02-08 DIAGNOSIS — Z7401 Bed confinement status: Secondary | ICD-10-CM

## 2021-02-08 DIAGNOSIS — E669 Obesity, unspecified: Secondary | ICD-10-CM | POA: Diagnosis present

## 2021-02-08 DIAGNOSIS — F05 Delirium due to known physiological condition: Secondary | ICD-10-CM | POA: Diagnosis present

## 2021-02-08 DIAGNOSIS — Z043 Encounter for examination and observation following other accident: Secondary | ICD-10-CM | POA: Diagnosis not present

## 2021-02-08 DIAGNOSIS — I129 Hypertensive chronic kidney disease with stage 1 through stage 4 chronic kidney disease, or unspecified chronic kidney disease: Secondary | ICD-10-CM | POA: Diagnosis not present

## 2021-02-08 DIAGNOSIS — E860 Dehydration: Secondary | ICD-10-CM | POA: Diagnosis not present

## 2021-02-08 DIAGNOSIS — I959 Hypotension, unspecified: Secondary | ICD-10-CM | POA: Diagnosis present

## 2021-02-08 DIAGNOSIS — R7989 Other specified abnormal findings of blood chemistry: Secondary | ICD-10-CM | POA: Diagnosis present

## 2021-02-08 DIAGNOSIS — I10 Essential (primary) hypertension: Secondary | ICD-10-CM

## 2021-02-08 DIAGNOSIS — K219 Gastro-esophageal reflux disease without esophagitis: Secondary | ICD-10-CM | POA: Diagnosis not present

## 2021-02-08 DIAGNOSIS — M6259 Muscle wasting and atrophy, not elsewhere classified, multiple sites: Secondary | ICD-10-CM | POA: Diagnosis not present

## 2021-02-08 DIAGNOSIS — R279 Unspecified lack of coordination: Secondary | ICD-10-CM | POA: Diagnosis not present

## 2021-02-08 DIAGNOSIS — R404 Transient alteration of awareness: Secondary | ICD-10-CM | POA: Diagnosis not present

## 2021-02-08 DIAGNOSIS — R4189 Other symptoms and signs involving cognitive functions and awareness: Secondary | ICD-10-CM | POA: Diagnosis present

## 2021-02-08 DIAGNOSIS — Z741 Need for assistance with personal care: Secondary | ICD-10-CM | POA: Diagnosis not present

## 2021-02-08 DIAGNOSIS — Z7902 Long term (current) use of antithrombotics/antiplatelets: Secondary | ICD-10-CM

## 2021-02-08 DIAGNOSIS — R4701 Aphasia: Secondary | ICD-10-CM | POA: Diagnosis not present

## 2021-02-08 DIAGNOSIS — Z794 Long term (current) use of insulin: Secondary | ICD-10-CM | POA: Diagnosis not present

## 2021-02-08 DIAGNOSIS — E782 Mixed hyperlipidemia: Secondary | ICD-10-CM | POA: Diagnosis not present

## 2021-02-08 DIAGNOSIS — N179 Acute kidney failure, unspecified: Secondary | ICD-10-CM | POA: Diagnosis not present

## 2021-02-08 DIAGNOSIS — I63523 Cerebral infarction due to unspecified occlusion or stenosis of bilateral anterior cerebral arteries: Secondary | ICD-10-CM | POA: Diagnosis not present

## 2021-02-08 DIAGNOSIS — D649 Anemia, unspecified: Secondary | ICD-10-CM | POA: Diagnosis present

## 2021-02-08 DIAGNOSIS — Z6836 Body mass index (BMI) 36.0-36.9, adult: Secondary | ICD-10-CM | POA: Diagnosis not present

## 2021-02-08 DIAGNOSIS — E1169 Type 2 diabetes mellitus with other specified complication: Secondary | ICD-10-CM | POA: Diagnosis not present

## 2021-02-08 DIAGNOSIS — R4182 Altered mental status, unspecified: Secondary | ICD-10-CM | POA: Diagnosis present

## 2021-02-08 DIAGNOSIS — R2689 Other abnormalities of gait and mobility: Secondary | ICD-10-CM | POA: Diagnosis not present

## 2021-02-08 DIAGNOSIS — Z7982 Long term (current) use of aspirin: Secondary | ICD-10-CM | POA: Diagnosis not present

## 2021-02-08 DIAGNOSIS — M6281 Muscle weakness (generalized): Secondary | ICD-10-CM | POA: Diagnosis not present

## 2021-02-08 DIAGNOSIS — F0390 Unspecified dementia without behavioral disturbance: Secondary | ICD-10-CM | POA: Diagnosis present

## 2021-02-08 DIAGNOSIS — E1122 Type 2 diabetes mellitus with diabetic chronic kidney disease: Secondary | ICD-10-CM

## 2021-02-08 DIAGNOSIS — E785 Hyperlipidemia, unspecified: Secondary | ICD-10-CM | POA: Diagnosis not present

## 2021-02-08 DIAGNOSIS — R4781 Slurred speech: Secondary | ICD-10-CM | POA: Diagnosis present

## 2021-02-08 DIAGNOSIS — F32A Depression, unspecified: Secondary | ICD-10-CM | POA: Diagnosis not present

## 2021-02-08 DIAGNOSIS — I6782 Cerebral ischemia: Principal | ICD-10-CM | POA: Diagnosis present

## 2021-02-08 DIAGNOSIS — Z20822 Contact with and (suspected) exposure to covid-19: Secondary | ICD-10-CM | POA: Diagnosis not present

## 2021-02-08 DIAGNOSIS — N1831 Chronic kidney disease, stage 3a: Secondary | ICD-10-CM | POA: Diagnosis not present

## 2021-02-08 DIAGNOSIS — R2681 Unsteadiness on feet: Secondary | ICD-10-CM | POA: Diagnosis not present

## 2021-02-08 DIAGNOSIS — I63513 Cerebral infarction due to unspecified occlusion or stenosis of bilateral middle cerebral arteries: Secondary | ICD-10-CM | POA: Diagnosis not present

## 2021-02-08 DIAGNOSIS — Z87891 Personal history of nicotine dependence: Secondary | ICD-10-CM

## 2021-02-08 DIAGNOSIS — I700 Atherosclerosis of aorta: Secondary | ICD-10-CM | POA: Diagnosis not present

## 2021-02-08 DIAGNOSIS — R778 Other specified abnormalities of plasma proteins: Secondary | ICD-10-CM | POA: Diagnosis not present

## 2021-02-08 DIAGNOSIS — R262 Difficulty in walking, not elsewhere classified: Secondary | ICD-10-CM | POA: Diagnosis not present

## 2021-02-08 DIAGNOSIS — R6889 Other general symptoms and signs: Secondary | ICD-10-CM | POA: Diagnosis not present

## 2021-02-08 DIAGNOSIS — I6389 Other cerebral infarction: Secondary | ICD-10-CM | POA: Diagnosis not present

## 2021-02-08 DIAGNOSIS — I499 Cardiac arrhythmia, unspecified: Secondary | ICD-10-CM | POA: Diagnosis not present

## 2021-02-08 DIAGNOSIS — R29810 Facial weakness: Secondary | ICD-10-CM | POA: Diagnosis not present

## 2021-02-08 DIAGNOSIS — M47816 Spondylosis without myelopathy or radiculopathy, lumbar region: Secondary | ICD-10-CM | POA: Diagnosis not present

## 2021-02-08 LAB — URINALYSIS, ROUTINE W REFLEX MICROSCOPIC
Bilirubin Urine: NEGATIVE
Glucose, UA: NEGATIVE mg/dL
Hgb urine dipstick: NEGATIVE
Ketones, ur: NEGATIVE mg/dL
Nitrite: NEGATIVE
Protein, ur: NEGATIVE mg/dL
Specific Gravity, Urine: 1.02 (ref 1.005–1.030)
pH: 5 (ref 5.0–8.0)

## 2021-02-08 LAB — COMPREHENSIVE METABOLIC PANEL
ALT: 25 U/L (ref 0–44)
AST: 49 U/L — ABNORMAL HIGH (ref 15–41)
Albumin: 3.5 g/dL (ref 3.5–5.0)
Alkaline Phosphatase: 58 U/L (ref 38–126)
Anion gap: 9 (ref 5–15)
BUN: 49 mg/dL — ABNORMAL HIGH (ref 8–23)
CO2: 20 mmol/L — ABNORMAL LOW (ref 22–32)
Calcium: 10.1 mg/dL (ref 8.9–10.3)
Chloride: 106 mmol/L (ref 98–111)
Creatinine, Ser: 2.71 mg/dL — ABNORMAL HIGH (ref 0.44–1.00)
GFR, Estimated: 16 mL/min — ABNORMAL LOW (ref >60)
Glucose, Bld: 91 mg/dL (ref 70–99)
Potassium: 4.5 mmol/L (ref 3.5–5.1)
Sodium: 135 mmol/L (ref 135–145)
Total Bilirubin: 0.4 mg/dL (ref 0.3–1.2)
Total Protein: 6.3 g/dL — ABNORMAL LOW (ref 6.5–8.1)

## 2021-02-08 LAB — CBC WITH DIFFERENTIAL/PLATELET
Abs Immature Granulocytes: 0.09 10*3/uL — ABNORMAL HIGH (ref 0.00–0.07)
Basophils Absolute: 0 10*3/uL (ref 0.0–0.1)
Basophils Relative: 0 %
Eosinophils Absolute: 0.1 10*3/uL (ref 0.0–0.5)
Eosinophils Relative: 1 %
HCT: 34.5 % — ABNORMAL LOW (ref 36.0–46.0)
Hemoglobin: 10.7 g/dL — ABNORMAL LOW (ref 12.0–15.0)
Immature Granulocytes: 1 %
Lymphocytes Relative: 13 %
Lymphs Abs: 1.5 10*3/uL (ref 0.7–4.0)
MCH: 26.8 pg (ref 26.0–34.0)
MCHC: 31 g/dL (ref 30.0–36.0)
MCV: 86.5 fL (ref 80.0–100.0)
Monocytes Absolute: 1.3 10*3/uL — ABNORMAL HIGH (ref 0.1–1.0)
Monocytes Relative: 11 %
Neutro Abs: 9.1 10*3/uL — ABNORMAL HIGH (ref 1.7–7.7)
Neutrophils Relative %: 74 %
Platelets: 308 10*3/uL (ref 150–400)
RBC: 3.99 MIL/uL (ref 3.87–5.11)
RDW: 16 % — ABNORMAL HIGH (ref 11.5–15.5)
WBC: 12.2 10*3/uL — ABNORMAL HIGH (ref 4.0–10.5)
nRBC: 0 % (ref 0.0–0.2)

## 2021-02-08 LAB — TROPONIN I (HIGH SENSITIVITY): Troponin I (High Sensitivity): 25 ng/L — ABNORMAL HIGH (ref <18)

## 2021-02-08 LAB — RESP PANEL BY RT-PCR (FLU A&B, COVID) ARPGX2
Influenza A by PCR: NEGATIVE
Influenza B by PCR: NEGATIVE
SARS Coronavirus 2 by RT PCR: NEGATIVE

## 2021-02-08 LAB — BRAIN NATRIURETIC PEPTIDE: B Natriuretic Peptide: 69 pg/mL (ref 0.0–100.0)

## 2021-02-08 LAB — CK: Total CK: 1211 U/L — ABNORMAL HIGH (ref 38–234)

## 2021-02-08 MED ORDER — SODIUM CHLORIDE 0.9 % IV BOLUS
1000.0000 mL | Freq: Once | INTRAVENOUS | Status: AC
  Administered 2021-02-08: 1000 mL via INTRAVENOUS

## 2021-02-08 MED ORDER — LORAZEPAM 2 MG/ML IJ SOLN
1.0000 mg | Freq: Once | INTRAMUSCULAR | Status: AC | PRN
  Administered 2021-02-08: 1 mg via INTRAVENOUS
  Filled 2021-02-08: qty 1

## 2021-02-08 MED ORDER — GADOBUTROL 1 MMOL/ML IV SOLN
7.5000 mL | Freq: Once | INTRAVENOUS | Status: AC | PRN
  Administered 2021-02-08: 7.5 mL via INTRAVENOUS

## 2021-02-08 MED ORDER — SODIUM CHLORIDE 0.9 % IV SOLN
1.0000 g | Freq: Once | INTRAVENOUS | Status: AC
  Administered 2021-02-08: 1 g via INTRAVENOUS
  Filled 2021-02-08: qty 10

## 2021-02-08 MED ORDER — ASPIRIN 325 MG PO TABS
325.0000 mg | ORAL_TABLET | Freq: Once | ORAL | Status: AC
  Administered 2021-02-09: 325 mg via ORAL
  Filled 2021-02-08: qty 1

## 2021-02-08 NOTE — ED Notes (Signed)
Patient transported to CT 

## 2021-02-08 NOTE — ED Notes (Signed)
Patient transported to MRI 

## 2021-02-08 NOTE — ED Notes (Signed)
RN called MRI and MRI is on their way to get pt.

## 2021-02-08 NOTE — ED Notes (Addendum)
RN had to give ativan in MRI. RN wasted with Westly Pam, RN.

## 2021-02-08 NOTE — ED Notes (Signed)
Pt changed and cleaned up. New sheets, brief and gown

## 2021-02-08 NOTE — ED Notes (Signed)
Pt is still at MRI at this time

## 2021-02-08 NOTE — ED Notes (Signed)
Correction- Urine came from [place] canister not catheter

## 2021-02-08 NOTE — ED Notes (Signed)
IV Team at bedside 

## 2021-02-08 NOTE — ED Triage Notes (Signed)
PT BIB EMS due to AMS. [place] and called pt to check on her and pt was confused. Pt is axox4 at baseline. Daughter lives in [place] and checked in on pt and found her wandering around her apartment confused. Daughter called EMS. Pt is axox3 upon arrival to ED

## 2021-02-08 NOTE — ED Notes (Signed)
Pt is clean and dry.

## 2021-02-08 NOTE — ED Provider Notes (Signed)
[place] EMERGENCY DEPARTMENT Provider Note   CSN: [id number] Arrival date & time: 02/08/21  1933     History Chief Complaint  Patient presents with   Altered Mental Status    Lindsay Martinez is a 87 y.o. female hx of HTN, DM, here with confusion. Patient lives at home with herself.  Son calls patient every day around 3 PM.  Last normal was 3 PM yesterday when son called.  Son called patient around 3 PM today.  Patient was noted to be very confused and altered and only answers 1 syllable questions.  The son then called the daughter who lives 10 minutes down the street from mother.  Daughter got off of work and went to [employer].  Patient was noted to be very altered and appears to have some expressive aphasia.  She appears to only answer yes or no questions.  At baseline, patient is very talkative.  Patient has some baseline dementia but lives at home by herself and able to take care of her ADLs  The history is provided by the patient, the EMS personnel and a relative.      Past Medical History:  Diagnosis Date   Memory impairment     Patient Active Problem List   Diagnosis Date Noted   Sepsis due to urinary tract infection ([place]) 10/22/2020   Overactive bladder 10/22/2020   HTN (hypertension) 10/22/2020   HLD (hyperlipidemia) 10/22/2020   Depression 10/22/2020   GERD (gastroesophageal reflux disease) 10/22/2020   Diabetes ([place]) 10/22/2020    Past Surgical History:  Procedure Laterality Date   LAPAROSCOPIC GASTRIC BANDING  2011     OB History   No obstetric history on file.     Family History  Family history unknown: Yes    Social History   Tobacco Use   Smoking status: Former   Smokeless tobacco: Never  Substance Use Topics   Alcohol use: Yes    Home Medications Prior to Admission medications   Medication Sig Start Date End Date Taking? Authorizing Provider  atorvastatin (LIPITOR) 10 MG tablet Take 10 mg by mouth daily. 10/19/20    [provider]  lisinopril (ZESTRIL) 20 MG tablet Take 20 mg by mouth daily. 10/19/20   [provider]  oxybutynin (DITROPAN) 5 MG tablet Take 5 mg by mouth daily.    [provider]  pantoprazole (PROTONIX) 40 MG tablet Take 40 mg by mouth daily. 10/19/20   [provider]  PARoxetine (PAXIL) 20 MG tablet Take 20 mg by mouth daily. 10/19/20   [provider]  traZODone (DESYREL) 100 MG tablet Take 100 mg by mouth at bedtime as needed for sleep. 10/19/20   [provider]    Allergies    Patient has no known allergies.  Review of Systems   Review of Systems  Neurological:  Positive for speech difficulty.  Psychiatric/Behavioral:  Positive for confusion.   All other systems reviewed and are negative.  Physical Exam Updated Vital Signs BP 98/88   Pulse 83   Temp (!) 97.3 F (36.3 C) (Oral)   Resp 19   SpO2 97%   Physical Exam Vitals and nursing note reviewed.  Constitutional:      Comments: Altered and slightly confused  HENT:     Head: Normocephalic.     Nose: Nose normal.     Mouth/Throat:     Mouth: Mucous membranes are moist.  Eyes:     Extraocular Movements: Extraocular movements intact.  Pupils: Pupils are equal, round, and reactive to light.  Cardiovascular:     Rate and Rhythm: Normal rate and regular rhythm.     Pulses: Normal pulses.     Heart sounds: Normal heart sounds.  Pulmonary:     Effort: Pulmonary effort is normal.     Breath sounds: Normal breath sounds.  Abdominal:     General: Abdomen is flat.     Palpations: Abdomen is soft.  Musculoskeletal:        General: Normal range of motion.     Cervical back: Normal range of motion and neck supple.  Skin:    General: Skin is warm.     Capillary Refill: Capillary refill takes less than 2 seconds.  Neurological:     Comments: Patient has some expressive aphasia.  Only able to answer yes or no questions and her name.  Strength is 4 out of 5 right leg  and 5 out of 5 otherwise.  Psychiatric:     Comments: Unable    ED Results / Procedures / Treatments   Labs (all labs ordered are listed, but only abnormal results are displayed) Labs Reviewed  CBC WITH DIFFERENTIAL/PLATELET - Abnormal; Notable for the following components:      Result Value   WBC 12.2 (*)    Hemoglobin 10.7 (*)    HCT 34.5 (*)    RDW 16.0 (*)    Neutro Abs 9.1 (*)    Monocytes Absolute 1.3 (*)    Abs Immature Granulocytes 0.09 (*)    All other components within normal limits  COMPREHENSIVE METABOLIC PANEL - Abnormal; Notable for the following components:   CO2 20 (*)    BUN 49 (*)    Creatinine, Ser 2.71 (*)    Total Protein 6.3 (*)    AST 49 (*)    GFR, Estimated 16 (*)    All other components within normal limits  CK - Abnormal; Notable for the following components:   Total CK 1,211 (*)    All other components within normal limits  TROPONIN I (HIGH SENSITIVITY) - Abnormal; Notable for the following components:   Troponin I (High Sensitivity) 25 (*)    All other components within normal limits  RESP PANEL BY RT-PCR (FLU A&B, COVID) [id number]  URINE CULTURE  BRAIN NATRIURETIC PEPTIDE  URINALYSIS, ROUTINE W REFLEX MICROSCOPIC  TROPONIN I (HIGH SENSITIVITY)    EKG EKG Interpretation  Date/Time:  Thursday February 08 2021 19:33:54 EDT Ventricular Rate:  99 PR Interval:  124 QRS Duration: 82 QT Interval:  333 QTC Calculation: 428 R Axis:   42 Text Interpretation: Sinus rhythm Atrial premature complexes Probable left atrial enlargement Low voltage, precordial leads No significant change since last tracing Confirmed by Wandra Arthurs [phone]) on 02/08/2021 7:37:52 PM  Radiology CT HEAD WO CONTRAST (5MM)  Result Date: 02/08/2021 CLINICAL DATA:  Mental status change confusion EXAM: CT HEAD WITHOUT CONTRAST TECHNIQUE: Contiguous axial images were obtained from the base of the skull through the vertex without intravenous contrast. COMPARISON:  CT brain 01/12/2021  FINDINGS: Brain: No hemorrhage or intracranial mass. New foci of hypodensity involving the cortical and subcortical left frontal lobe at the cranial vertex, series 3, image 6 and sagittal series 5, image 35. Mild atrophy. Mild white matter small vessel ischemic change. Stable ventricle size Vascular: No hyperdense vessels.  Carotid vascular calcification. Skull: Normal. Negative for fracture or focal lesion. Sinuses/Orbits: No acute finding. Other: None IMPRESSION: 1. Findings suspicious for small acute to  subacute cortical and subcortical infarcts at the left cranial vertex. MRI follow-up as indicated. No hemorrhage. Findings appear new since CT from July 2022. Electronically Signed   By: Donavan Foil M.D.   On: 02/08/2021 20:58   DG Pelvis Portable  Result Date: 02/08/2021 CLINICAL DATA:  Fall EXAM: PORTABLE PELVIS 1-2 VIEWS COMPARISON:  None. FINDINGS: No fracture or dislocation is seen. Bilateral hip joint spaces are preserved. Visualized bony pelvis appears intact. Degenerative changes of the lower lumbar spine. IMPRESSION: Negative. Electronically Signed   By: Julian Hy M.D.   On: 02/08/2021 20:27   DG Chest Port 1 View  Result Date: 02/08/2021 CLINICAL DATA:  Fall EXAM: PORTABLE CHEST 1 VIEW COMPARISON:  10/22/2020 FINDINGS: Lungs are clear.  No pleural effusion or pneumothorax. The heart is top-normal in size.  Thoracic aortic atherosclerosis. Surgical clips along the right lateral chest wall. IMPRESSION: No evidence of acute cardiopulmonary disease. Electronically Signed   By: Julian Hy M.D.   On: 02/08/2021 20:27    Procedures Procedures   Medications Ordered in ED Medications  sodium chloride 0.9 % bolus 1,000 mL (1,000 mLs Intravenous New Bag/Given 02/08/21 2111)  LORazepam (ATIVAN) injection 1 mg (1 mg Intravenous Given 02/08/21 2220)    ED Course  I have reviewed the triage vital signs and the nursing notes.  Pertinent labs & imaging results that were available  during my care of the patient were reviewed by me and considered in my medical decision making (see chart for details).    MDM Rules/Calculators/A&P                          CHERILEE MENARD is a 87 y.o. female here presenting with trouble speaking and confusion.  Last normal was 3 PM yesterday.  Patient appears confused and appears to have some slurred speech on arrival.  Will consult neurology as she is outside the 24-hour window.   9 pm Dr. Curly Shores saw patient.  Patient has AKI and has improving symptoms.  Neurology does not want to do CTA head and neck or perfusion study right now.  We will get MRI brain and MRA.  She recommend admission for stroke work-up.  11:30 PM Given IVF for AKI. Borderline hypotensive. UA + UTI, given rocephin.  Altered mental status likely multifactorial.  Patient has AKI and also may have a new stroke and has UTI as well.  At this point, hospitalist to admit.  MRI is pending.  Final Clinical Impression(s) / ED Diagnoses Final diagnoses:  None    Rx / DC Orders ED Discharge Orders     None        Drenda Freeze, MD 02/08/21 2330

## 2021-02-09 ENCOUNTER — Inpatient Hospital Stay (HOSPITAL_COMMUNITY): Payer: Medicare Other

## 2021-02-09 ENCOUNTER — Encounter (HOSPITAL_COMMUNITY): Payer: Self-pay | Admitting: Internal Medicine

## 2021-02-09 DIAGNOSIS — I639 Cerebral infarction, unspecified: Secondary | ICD-10-CM

## 2021-02-09 DIAGNOSIS — I10 Essential (primary) hypertension: Secondary | ICD-10-CM

## 2021-02-09 DIAGNOSIS — N1831 Chronic kidney disease, stage 3a: Secondary | ICD-10-CM

## 2021-02-09 DIAGNOSIS — E1169 Type 2 diabetes mellitus with other specified complication: Secondary | ICD-10-CM

## 2021-02-09 DIAGNOSIS — R7989 Other specified abnormal findings of blood chemistry: Secondary | ICD-10-CM | POA: Diagnosis present

## 2021-02-09 DIAGNOSIS — I6389 Other cerebral infarction: Secondary | ICD-10-CM

## 2021-02-09 DIAGNOSIS — N179 Acute kidney failure, unspecified: Secondary | ICD-10-CM

## 2021-02-09 DIAGNOSIS — E782 Mixed hyperlipidemia: Secondary | ICD-10-CM

## 2021-02-09 DIAGNOSIS — E1122 Type 2 diabetes mellitus with diabetic chronic kidney disease: Secondary | ICD-10-CM

## 2021-02-09 DIAGNOSIS — R410 Disorientation, unspecified: Secondary | ICD-10-CM

## 2021-02-09 DIAGNOSIS — R748 Abnormal levels of other serum enzymes: Secondary | ICD-10-CM | POA: Diagnosis present

## 2021-02-09 DIAGNOSIS — R778 Other specified abnormalities of plasma proteins: Secondary | ICD-10-CM | POA: Diagnosis present

## 2021-02-09 DIAGNOSIS — K219 Gastro-esophageal reflux disease without esophagitis: Secondary | ICD-10-CM

## 2021-02-09 HISTORY — DX: Type 2 diabetes mellitus with other specified complication: E11.69

## 2021-02-09 LAB — COMPREHENSIVE METABOLIC PANEL
ALT: 18 U/L (ref 0–44)
AST: 40 U/L (ref 15–41)
Albumin: 2.5 g/dL — ABNORMAL LOW (ref 3.5–5.0)
Alkaline Phosphatase: 42 U/L (ref 38–126)
Anion gap: 7 (ref 5–15)
BUN: 37 mg/dL — ABNORMAL HIGH (ref 8–23)
CO2: 17 mmol/L — ABNORMAL LOW (ref 22–32)
Calcium: 8.6 mg/dL — ABNORMAL LOW (ref 8.9–10.3)
Chloride: 113 mmol/L — ABNORMAL HIGH (ref 98–111)
Creatinine, Ser: 1.66 mg/dL — ABNORMAL HIGH (ref 0.44–1.00)
GFR, Estimated: 30 mL/min — ABNORMAL LOW (ref >60)
Glucose, Bld: 89 mg/dL (ref 70–99)
Potassium: 4.2 mmol/L (ref 3.5–5.1)
Sodium: 137 mmol/L (ref 135–145)
Total Bilirubin: 0.7 mg/dL (ref 0.3–1.2)
Total Protein: 4.6 g/dL — ABNORMAL LOW (ref 6.5–8.1)

## 2021-02-09 LAB — CBC WITH DIFFERENTIAL/PLATELET
Abs Immature Granulocytes: 0.04 10*3/uL (ref 0.00–0.07)
Basophils Absolute: 0 10*3/uL (ref 0.0–0.1)
Basophils Relative: 1 %
Eosinophils Absolute: 0.2 10*3/uL (ref 0.0–0.5)
Eosinophils Relative: 3 %
HCT: 30.6 % — ABNORMAL LOW (ref 36.0–46.0)
Hemoglobin: 9 g/dL — ABNORMAL LOW (ref 12.0–15.0)
Immature Granulocytes: 1 %
Lymphocytes Relative: 25 %
Lymphs Abs: 1.8 10*3/uL (ref 0.7–4.0)
MCH: 26.9 pg (ref 26.0–34.0)
MCHC: 29.4 g/dL — ABNORMAL LOW (ref 30.0–36.0)
MCV: 91.6 fL (ref 80.0–100.0)
Monocytes Absolute: 0.9 10*3/uL (ref 0.1–1.0)
Monocytes Relative: 13 %
Neutro Abs: 4.2 10*3/uL (ref 1.7–7.7)
Neutrophils Relative %: 57 %
Platelets: 226 10*3/uL (ref 150–400)
RBC: 3.34 MIL/uL — ABNORMAL LOW (ref 3.87–5.11)
RDW: 15.9 % — ABNORMAL HIGH (ref 11.5–15.5)
WBC: 7.2 10*3/uL (ref 4.0–10.5)
nRBC: 0 % (ref 0.0–0.2)

## 2021-02-09 LAB — CK: Total CK: 869 U/L — ABNORMAL HIGH (ref 38–234)

## 2021-02-09 LAB — LIPID PANEL
Cholesterol: 108 mg/dL (ref 0–200)
HDL: 40 mg/dL — ABNORMAL LOW (ref >40)
Total CHOL/HDL Ratio: 2.7 RATIO
Triglycerides: 72 mg/dL (ref <150)
VLDL: 14 mg/dL (ref 0–40)

## 2021-02-09 LAB — TSH: TSH: 0.435 u[IU]/mL (ref 0.350–4.500)

## 2021-02-09 LAB — ECHOCARDIOGRAM COMPLETE: S' Lateral: 2.3 cm

## 2021-02-09 LAB — CBG MONITORING, ED
Glucose-Capillary: 81 mg/dL (ref 70–99)
Glucose-Capillary: 76 mg/dL (ref 70–99)
Glucose-Capillary: 93 mg/dL (ref 70–99)

## 2021-02-09 LAB — PROCALCITONIN: Procalcitonin: <0.1 ng/mL

## 2021-02-09 LAB — C-REACTIVE PROTEIN: CRP: 0.5 mg/dL (ref <1.0)

## 2021-02-09 LAB — TROPONIN I (HIGH SENSITIVITY): Troponin I (High Sensitivity): 29 ng/L — ABNORMAL HIGH (ref <18)

## 2021-02-09 LAB — GLUCOSE, CAPILLARY: Glucose-Capillary: 96 mg/dL (ref 70–99)

## 2021-02-09 MED ORDER — HYDRALAZINE HCL 20 MG/ML IJ SOLN: 10.0000 mg | Freq: Four times a day (QID) | INTRAMUSCULAR | Status: DC | PRN

## 2021-02-09 MED ORDER — LACTATED RINGERS IV SOLN: INTRAVENOUS | Status: DC

## 2021-02-09 MED ORDER — CLOPIDOGREL BISULFATE 75 MG PO TABS
75.0000 mg | ORAL_TABLET | Freq: Every day | ORAL | Status: DC
  Administered 2021-02-10 – 2021-02-13 (×4): 75 mg via ORAL
  Filled 2021-02-09 (×5): qty 1

## 2021-02-09 MED ORDER — CLOPIDOGREL BISULFATE 75 MG PO TABS: 75.0000 mg | ORAL_TABLET | Freq: Every day | ORAL | Status: DC

## 2021-02-09 MED ORDER — ATORVASTATIN CALCIUM 10 MG PO TABS
10.0000 mg | ORAL_TABLET | Freq: Every day | ORAL | Status: DC
  Administered 2021-02-09 – 2021-02-13 (×5): 10 mg via ORAL
  Filled 2021-02-09 (×5): qty 1

## 2021-02-09 MED ORDER — ACETAMINOPHEN 325 MG PO TABS: 650.0000 mg | ORAL_TABLET | ORAL | Status: DC | PRN

## 2021-02-09 MED ORDER — LACTATED RINGERS IV SOLN: INTRAVENOUS | Status: AC

## 2021-02-09 MED ORDER — STROKE: EARLY STAGES OF RECOVERY BOOK
Freq: Once | Status: AC
  Filled 2021-02-09: qty 1

## 2021-02-09 MED ORDER — ASPIRIN EC 325 MG PO TBEC
325.0000 mg | DELAYED_RELEASE_TABLET | Freq: Every day | ORAL | Status: DC
  Administered 2021-02-09 – 2021-02-13 (×5): 325 mg via ORAL
  Filled 2021-02-09 (×5): qty 1

## 2021-02-09 MED ORDER — ACETAMINOPHEN 650 MG RE SUPP: 650.0000 mg | RECTAL | Status: DC | PRN

## 2021-02-09 MED ORDER — CLOPIDOGREL BISULFATE 300 MG PO TABS
300.0000 mg | ORAL_TABLET | Freq: Once | ORAL | Status: AC
  Administered 2021-02-09: 300 mg via ORAL
  Filled 2021-02-09: qty 1

## 2021-02-09 MED ORDER — POLYETHYLENE GLYCOL 3350 17 G PO PACK
17.0000 g | PACK | Freq: Every day | ORAL | Status: DC | PRN
  Administered 2021-02-11: 17 g via ORAL
  Filled 2021-02-09: qty 1

## 2021-02-09 MED ORDER — ONDANSETRON HCL 4 MG/2ML IJ SOLN: 4.0000 mg | Freq: Four times a day (QID) | INTRAMUSCULAR | Status: DC | PRN

## 2021-02-09 MED ORDER — ENOXAPARIN SODIUM 30 MG/0.3ML IJ SOSY
30.0000 mg | PREFILLED_SYRINGE | INTRAMUSCULAR | Status: DC
  Administered 2021-02-09 – 2021-02-13 (×5): 30 mg via SUBCUTANEOUS
  Filled 2021-02-09 (×5): qty 0.3

## 2021-02-09 MED ORDER — PANTOPRAZOLE SODIUM 40 MG PO TBEC
40.0000 mg | DELAYED_RELEASE_TABLET | Freq: Every day | ORAL | Status: DC
  Administered 2021-02-09 – 2021-02-13 (×5): 40 mg via ORAL
  Filled 2021-02-09 (×5): qty 1

## 2021-02-09 MED ORDER — ASPIRIN EC 81 MG PO TBEC: 81.0000 mg | DELAYED_RELEASE_TABLET | Freq: Every day | ORAL | Status: DC

## 2021-02-09 MED ORDER — INSULIN ASPART 100 UNIT/ML IJ SOLN: 0.0000 [IU] | Freq: Three times a day (TID) | INTRAMUSCULAR | Status: DC

## 2021-02-09 MED ORDER — OXYBUTYNIN CHLORIDE 5 MG PO TABS
5.0000 mg | ORAL_TABLET | Freq: Every day | ORAL | Status: DC
  Administered 2021-02-09 – 2021-02-13 (×5): 5 mg via ORAL
  Filled 2021-02-09 (×5): qty 1

## 2021-02-09 MED ORDER — PAROXETINE HCL 20 MG PO TABS
20.0000 mg | ORAL_TABLET | Freq: Every day | ORAL | Status: DC
  Administered 2021-02-09 – 2021-02-13 (×5): 20 mg via ORAL
  Filled 2021-02-09 (×5): qty 1

## 2021-02-09 MED ORDER — TRAZODONE HCL 100 MG PO TABS
100.0000 mg | ORAL_TABLET | Freq: Every evening | ORAL | Status: DC | PRN
  Administered 2021-02-11: 100 mg via ORAL
  Filled 2021-02-09: qty 1

## 2021-02-09 MED ORDER — ACETAMINOPHEN 160 MG/5ML PO SOLN: 650.0000 mg | ORAL | Status: DC | PRN

## 2021-02-09 NOTE — Evaluation (Signed)
Occupational Therapy Evaluation Patient Details Name: Lindsay Martinez MRN: [id number] DOB: 1931-01-21 Today's Date: 02/09/2021    History of Present Illness 87 year old female with past medical history of diabetes mellitus type 2, hypertension, dementia, hyperlipidemia, gastroesophageal reflux disease, chronic kidney disease stage IIIa, obesity who presents to [facility] due to concerns for confusion.  MRI: found to have multifocal watershed CVA bilateral cerebrum, L greater than R. No tPA given.   Clinical Impression   Patient admitted for the diagnosis above.  PTA she lived alone with check ins from her daughter.  Barriers are listed below.  Currently she is needing up to [place] for ADL completion from a sit/stand level and for basic transfers.  OT will follow in the acute setting to maximize her functional status.  SNF is recommended as patient will need initial 24 hour assist as needed prior to returning home.      Follow Up Recommendations  SNF    Equipment Recommendations  3 in 1 bedside commode    Recommendations for Other Services       Precautions / Restrictions Precautions Precautions: Fall Precaution Comments: Incontinent of urine Restrictions Weight Bearing Restrictions: No      Mobility Bed Mobility Overal bed mobility: Needs Assistance Bed Mobility: Sit to Supine       Sit to supine: Min assist   General bed mobility comments: bring R leg onto the bed Patient Response: Restless  Transfers Overall transfer level: Needs assistance Equipment used: Rolling walker (2 wheeled) Transfers: Sit to/from [facility] Sit to Stand: Min guard Stand pivot transfers: Min assist       General transfer comment: RW    Balance Overall balance assessment: Needs assistance Sitting-balance support: Feet supported Sitting balance-Leahy Scale: Fair Sitting balance - Comments: safety   Standing balance support: Bilateral upper extremity supported Standing  balance-Leahy Scale: Poor Standing balance comment: reliant on RW and external cues/assist                           ADL either performed or assessed with clinical judgement   ADL Overall ADL's : Needs assistance/impaired Eating/Feeding: Set up;Sitting Eating/Feeding Details (indicate cue type and reason): just completing a meal             Upper Body Dressing : Minimal assistance;Sitting   Lower Body Dressing: Minimal assistance;Sitting/lateral leans Lower Body Dressing Details (indicate cue type and reason): able to reach feet to remove socks, assist to place back on.             Functional mobility during ADLs: Minimal assistance;Rolling walker General ADL Comments: difficulty with comprehension, which impacts mobility     Vision Baseline Vision/History: Wears glasses Patient Visual Report: No change from baseline       Perception     Praxis      Pertinent Vitals/Pain Pain Assessment: No/denies pain     Hand Dominance Right   Extremity/Trunk Assessment Upper Extremity Assessment Upper Extremity Assessment: LUE deficits/detail;Generalized weakness LUE Deficits / Details: decreased end range L shoulder FF LUE Sensation: WNL LUE Coordination: WNL   Lower Extremity Assessment Lower Extremity Assessment: Defer to PT evaluation   Cervical / Trunk Assessment Cervical / Trunk Assessment: Kyphotic   Communication Communication Communication: Expressive difficulties   Cognition Arousal/Alertness: Awake/alert Behavior During Therapy: Restless Overall Cognitive Status: No family/caregiver present to determine baseline cognitive functioning Area of Impairment: Orientation;Memory;Following commands;Safety/judgement;Awareness;Problem solving  Orientation Level: Disoriented to;Place;Time;Situation   Memory: Decreased short-term memory Following Commands: Follows one step commands with increased time Safety/Judgement: Decreased  awareness of safety;Decreased awareness of deficits Awareness: Intellectual Problem Solving: Difficulty sequencing;Requires verbal cues;Requires tactile cues;Slow processing General Comments: Baseline cognitive impairment per chart.   General Comments       Exercises     Shoulder Instructions      Home Living Family/patient expects to be discharged to:: Private residence Living Arrangements: Alone Available Help at Discharge: Family;Available PRN/intermittently Type of Home: Other(Comment) (3rd floor condo) Home Access: Elevator     Home Layout: One level     Bathroom Shower/Tub: Tub/shower unit;Walk-in shower   Bathroom Toilet: Handicapped height     Home Equipment: Clinical cytogeneticist - 2 wheels;Grab bars - tub/shower   Additional Comments: will have to verify information      Prior Functioning/Environment Level of Independence: Independent        Comments: Per patient: Does own ADLs, daughter assists with community mobility, brings meals, and assists with bills.  Does minimal cooking.  Using RW in condo.        OT Problem List: Decreased activity tolerance;Impaired balance (sitting and/or standing);Decreased knowledge of use of DME or AE;Decreased safety awareness;Decreased cognition      OT Treatment/Interventions: Self-care/ADL training;DME and/or AE instruction;Balance training;Patient/family education;Cognitive remediation/compensation;Therapeutic activities    OT Goals(Current goals can be found in the care plan section) Acute Rehab OT Goals Patient Stated Goal: Patient unsure OT Goal Formulation: Patient unable to participate in goal setting Time For Goal Achievement: 02/23/21 Potential to Achieve Goals: Good  OT Frequency: Min 2X/week   Barriers to D/C: Decreased caregiver support          Co-evaluation              AM-PAC OT "6 Clicks" Daily Activity     Outcome Measure Help from another person eating meals?: None Help from another  person taking care of personal grooming?: None Help from another person toileting, which includes using toliet, bedpan, or urinal?: A Little Help from another person bathing (including washing, rinsing, drying)?: A Little Help from another person to put on and taking off regular upper body clothing?: A Little Help from another person to put on and taking off regular lower body clothing?: A Little 6 Click Score: 20   End of Session Equipment Utilized During Treatment: Rolling walker Nurse Communication: Mobility status  Activity Tolerance: Patient tolerated treatment well Patient left: in bed;with call bell/phone within reach;with bed alarm set  OT Visit Diagnosis: Unsteadiness on feet (R26.81);Other symptoms and signs involving cognitive function                Time: [id number] OT Time Calculation (min): 18 min Charges:  OT General Charges $OT Visit: 1 Visit OT Evaluation $OT Eval Moderate Complexity: 1 Mod  02/09/2021  Rich, OTR/L  Acute Rehabilitation Services  Office:  [place] 02/09/2021, 4:06 PM

## 2021-02-09 NOTE — Procedures (Signed)
Patient Name: CLOTILDA MACIOLEK  MRN: [id number]  Epilepsy Attending: Lora Havens  Referring Physician/Provider: Dr Lesleigh Noe Date: 02/09/2021 Duration: 21.49 mins  Patient history: 87 year old female with acute onset confusion.  EEG to evaluate seizures.  Level of alertness: Awake, asleep  AEDs during EEG study: None  Technical aspects: This EEG study was done with scalp electrodes positioned according to the 10-20 International system of electrode placement. Electrical activity was acquired at a sampling rate of '500Hz'$  and reviewed with a high frequency filter of '70Hz'$  and a low frequency filter of '1Hz'$ . EEG data were recorded continuously and digitally stored.   Description: The posterior dominant rhythm consists of 7.5 Hz activity of moderate voltage (25-35 uV) seen predominantly in posterior head regions, symmetric and reactive to eye opening and eye closing. Sleep was characterized by vertex waves, sleep spindles (12 to 14 Hz), maximal frontocentral region.  EEG showed continuous generalized 5 to 6 Hz theta slowing. Physiologic photic driving was not seen during photic stimulation.  Hyperventilation was not performed.     ABNORMALITY - Continuous slow, generalized  IMPRESSION: This study is suggestive of mild diffuse encephalopathy, nonspecific etiology. No seizures or epileptiform discharges were seen throughout the recording.  Artemisa Sladek Barbra Sarks

## 2021-02-09 NOTE — Progress Notes (Addendum)
STROKE TEAM PROGRESS NOTE   ATTENDING NOTE: I reviewed above note and agree with the assessment and plan. Pt was seen and examined.   87 year old female with history of diabetes, hypertension, hyperlipidemia, CKD, cognitive impairment admitted for confusion, altered mental status, waxing and waning speech difficulty.  CT concerning for left hemispheric infarct.  MRI showed right MCA/PCA punctate, bilateral MCA/ACA and ACA infarcts.  MRI showed bilateral A2 severe stenosis.  EF 70 to 75%.  LDL and A1c pending.  Orthostatic vital showed no orthostatic hypotension.  Creatinine 2.71->1.66, significant elevated from her baseline, concerning for dehydration.  On exam, patient awake alert, orientated to place, age, self, months but not orientated to date or year.  Follows simple commands, no aphasia, able to name and repeat.  No gaze palsy, facial symmetrical, visual field fall.  Bilateral upper extremity motor strengths equal symmetrical, sensory and finger-to-nose bilaterally intact.  Etiology for patient stroke likely due to intracranial stenosis (b/l A2) in the setting of dehydration and hypotension on presentation.  Currently symptoms most resolved after IV fluid.  Recommend aspirin 325 and Plavix 75 DAPT for 3 months and then aspirin alone given severe intracranial stenosis.  Continue statin.  Avoid low BP.  BP goal 130-150 given severe intracranial stenosis.  Recommend 30-day cardiac event monitoring given bilateral involvement.  PT/OT recommend SNF.  For detailed assessment and plan, please refer to above as I have made changes wherever appropriate.   Neurology will sign off. Please call with questions. Pt will follow up with stroke clinic NP at [facility] in about 4 weeks. Thanks for the consult.   Rosalin Hawking, MD PhD Stroke Neurology 02/09/2021 6:42 PM    SUBJECTIVE (INTERVAL HISTORY)  Overall her condition is unchanged. Patient was sitting in her chair on evaluation today, resting comfortably. She  was pleasant, able to follow commands, continues to exhibit difficulty with conversation (hx of memory impairment) and expressive aphasia. She continues to be confused about the month and year (thinks is April, unsure of year, however knows who the president is). Pt continues to answer mostly yes and no, speaks short sentences although drifts off mid conversation.   OBJECTIVE Temp:  [97.3 F (36.3 C)-97.8 F (36.6 C)] 97.8 F (36.6 C) (08/12 0730) Pulse Rate:  [51-102] 62 (08/12 0700) Cardiac Rhythm: Atrial fibrillation (08/12 0731) Resp:  [16-27] 18 (08/12 0700) BP: (98-141)/(56-93) 128/64 (08/12 0700) SpO2:  [93 %-100 %] 99 % (08/12 0700)  Recent Labs  Lab 02/09/21 0830  GLUCAP 81   Recent Labs  Lab 02/08/21 2013 02/09/21 0507  NA 135 137  K 4.5 4.2  CL 106 113*  CO2 20* 17*  GLUCOSE 91 89  BUN 49* 37*  CREATININE 2.71* 1.66*  CALCIUM 10.1 8.6*   Recent Labs  Lab 02/08/21 2013 02/09/21 0507  AST 49* 40  ALT 25 18  ALKPHOS 58 42  BILITOT 0.4 0.7  PROT 6.3* 4.6*  ALBUMIN 3.5 2.5*   Recent Labs  Lab 02/08/21 2013 02/09/21 0507  WBC 12.2* 7.2  NEUTROABS 9.1* 4.2  HGB 10.7* 9.0*  HCT 34.5* 30.6*  MCV 86.5 91.6  PLT 308 226   Recent Labs  Lab 02/08/21 2013 02/09/21 0507  CKTOTAL 1,211* 869*   No results for input(s): LABPROT, INR in the last 72 hours. Recent Labs    02/08/21 2246  COLORURINE YELLOW  LABSPEC 1.020  PHURINE 5.0  GLUCOSEU NEGATIVE  HGBUR NEGATIVE  BILIRUBINUR NEGATIVE  KETONESUR NEGATIVE  PROTEINUR NEGATIVE  NITRITE NEGATIVE  LEUKOCYTESUR MODERATE*       Component Value Date/Time   CHOL 108 02/09/2021 0507   TRIG 72 02/09/2021 0507   HDL 40 (L) 02/09/2021 0507   CHOLHDL 2.7 02/09/2021 0507   VLDL 14 02/09/2021 0507   LDLCALC NOT CALCULATED 02/09/2021 0507   Lab Results  Component Value Date   HGBA1C 6.7 (H) 10/23/2020   No results found for: LABOPIA, COCAINSCRNUR, LABBENZ, AMPHETMU, THCU, LABBARB  No results for  input(s): ETH in the last 168 hours.  I have personally reviewed the radiological images below and agree with the radiology interpretations.  DG Thoracic Spine 2 View  Result Date: 01/12/2021 CLINICAL DATA:  87 year old female status post fall with pain. EXAM: THORACIC SPINE 2 VIEWS COMPARISON:  Portable chest 10/22/2020. FINDINGS: Thoracic segmentation appears normal. Preserved thoracic kyphosis. Preserved vertebral body height. Multilevel midthoracic disc space loss. And bulky thoracic endplate spurring from T6 through T10. Cervicothoracic junction alignment is within normal limits. Grossly intact L1 level. No acute osseous abnormality identified. Partially visible cervical disc and endplate degeneration also. Osteopenia in the posterior ribs which appear grossly intact. Gastric band partially visible. Numerous right axillary/chest wall surgical clips. Visible bowel-gas pattern and visceral contours in the chest appear normal. IMPRESSION: 1. No acute osseous abnormality identified in the thoracic spine. 2. Multilevel thoracic disc and endplate degeneration. Electronically Signed   By: Genevie Ann M.D.   On: 01/12/2021 11:28   CT HEAD WO CONTRAST (5MM)  Result Date: 02/08/2021 CLINICAL DATA:  Mental status change confusion EXAM: CT HEAD WITHOUT CONTRAST TECHNIQUE: Contiguous axial images were obtained from the base of the skull through the vertex without intravenous contrast. COMPARISON:  CT brain 01/12/2021 FINDINGS: Brain: No hemorrhage or intracranial mass. New foci of hypodensity involving the cortical and subcortical left frontal lobe at the cranial vertex, series 3, image 6 and sagittal series 5, image 35. Mild atrophy. Mild white matter small vessel ischemic change. Stable ventricle size Vascular: No hyperdense vessels.  Carotid vascular calcification. Skull: Normal. Negative for fracture or focal lesion. Sinuses/Orbits: No acute finding. Other: None IMPRESSION: 1. Findings suspicious for small acute  to subacute cortical and subcortical infarcts at the left cranial vertex. MRI follow-up as indicated. No hemorrhage. Findings appear new since CT from July 2022. Electronically Signed   By: Donavan Foil M.D.   On: 02/08/2021 20:58   CT Head Wo Contrast  Result Date: 01/12/2021 CLINICAL DATA:  Fall, facial trauma, bruising and swelling above left eyebrow EXAM: CT HEAD WITHOUT CONTRAST CT MAXILLOFACIAL WITHOUT CONTRAST TECHNIQUE: Multidetector CT imaging of the head and maxillofacial structures were performed using the standard protocol without intravenous contrast. Multiplanar CT image reconstructions of the maxillofacial structures were also generated. COMPARISON:  10/22/2020 FINDINGS: CT HEAD FINDINGS Brain: No evidence of acute infarction, hemorrhage, hydrocephalus, extra-axial collection or mass lesion/mass effect. Mild periventricular and deep white matter hypodensity. Vascular: No hyperdense vessel or unexpected calcification. Skull: Normal. Negative for fracture or focal lesion. Other: Soft tissue hematoma of the left forehead (series 3, image 6) and right parietal scalp (series 3, image 12). CT MAXILLOFACIAL FINDINGS Osseous: Minimally angulated fractures of the nasal bones (series 4, image 33). No other displaced fracture or mandibular dislocation. No destructive process. Orbits: Negative. No traumatic or inflammatory finding. Sinuses: Clear. Soft tissues: Soft tissue contusion over the nose. IMPRESSION: 1. No acute intracranial pathology. Small-vessel white matter disease. 2. Minimally angulated fractures of the nasal bones, of uncertain acuity. Correlate for acute point tenderness. 3. No other displaced  fracture or dislocation of the facial bones. 4. Soft tissue hematoma of the left forehead and right parietal scalp. Electronically Signed   By: Eddie Candle M.D.   On: 01/12/2021 11:27   MR ANGIO HEAD WO CONTRAST  Result Date: 02/09/2021 CLINICAL DATA:  Initial evaluation for acute stroke. EXAM:  MRI HEAD WITHOUT CONTRAST MRA HEAD WITHOUT CONTRAST MRA NECK WITHOUT AND WITH CONTRAST TECHNIQUE: Multiplanar, multi-echo pulse sequences of the brain and surrounding structures were acquired without intravenous contrast. Angiographic images of the Circle of Willis were acquired using MRA technique without intravenous contrast. Angiographic images of the neck were acquired using MRA technique without and with intravenous contrast. Carotid stenosis measurements (when applicable) are obtained utilizing NASCET criteria, using the distal internal carotid diameter as the denominator. CONTRAST:  7.44m GADAVIST GADOBUTROL 1 MMOL/ML IV SOLN COMPARISON:  CT from earlier the same day. FINDINGS: MRI HEAD FINDINGS Brain: Cerebral volume within normal limits for age. Scattered patchy T2/FLAIR hyperintensity within the periventricular deep white matter both cerebral hemispheres most consistent with chronic small vessel ischemic disease, mild for age. Patchy areas of diffusion signal abnormality seen involving the cortical and subcortical aspect of the high left frontal and parietal lobes near the vertex, corresponding with abnormality on prior CT (series 5, image 89). Additional patchy cortical and subcortical diffusion abnormality seen involving the right parietal lobe (series 5, images 85, 82). Associated mild T2/FLAIR signal intensity with some residual ADC signal loss. Findings most consistent with acute to early subacute ischemic infarcts. No associated hemorrhage or mass effect. Otherwise, gray-white matter differentiation maintained with no other acute or subacute ischemia. No encephalomalacia to suggest chronic cortical infarction elsewhere within the brain. No other evidence for acute or chronic intracranial hemorrhage. No mass lesion or midline shift. No hydrocephalus or extra-axial fluid collection. Pituitary gland suprasellar region normal. Midline structures intact. Vascular: Major intracranial vascular flow voids  are maintained. Skull and upper cervical spine: Craniocervical junction within normal limits. Bone marrow signal intensity diffusely heterogeneous. No visible focal marrow replacing lesion. No scalp soft tissue abnormality. Sinuses/Orbits: Prior bilateral ocular lens replacement. Scattered mucosal thickening noted within the ethmoidal air cells. Paranasal sinuses are otherwise clear. No significant mastoid effusion. Inner ear structures grossly normal. Other: None. MRA HEAD FINDINGS Anterior circulation: Examination mildly degraded by motion. Visualized distal cervical segments of the internal carotid arteries are patent with antegrade flow. Petrous segments patent bilaterally. Scattered atheromatous irregularity throughout the carotid siphons without visible high-grade stenosis. There is question of a small 3 mm aneurysm projecting medially from the cavernous left ICA on corresponding MRA of the neck (series 17, image 13), not entirely certain given motion, and not definitely seen on the MRA head portion of this study. A1 segments patent bilaterally. Normal anterior communicating artery complex. Multifocal severe bilateral A2 stenoses are seen (series 5, images 115 and 142 on the right) (series 5, image 136 on the left). ACAs remain patent to their distal aspects. Atheromatous irregularity throughout the M1 segments without high-grade stenosis. Normal MCA bifurcations. Diffuse small vessel atheromatous change seen throughout the distal MCA branches bilaterally without proximal branch occlusion. Posterior circulation: Both vertebral arteries patent to the vertebrobasilar junction without stenosis. Right vertebral artery dominant. Both PICA origins patent and normal. Basilar mildly irregular but patent to its distal aspect without stenosis. Superior cerebral arteries patent bilaterally. Both PCAs primarily supplied via the basilar, although a small right posterior communicating artery is noted. Atheromatous  irregularity within the PCAs without high-grade stenosis. Anatomic variants: None significant.  MRA NECK FINDINGS Aortic arch: Examination degraded by motion. Visualized aortic arch normal caliber with normal branch pattern. No significant stenosis seen about the origin the great vessels. Visualized subclavian arteries patent. Right carotid system: Right CCA patent from its origin to the bifurcation without stenosis. No significant atheromatous stenosis about the right carotid bulb or proximal right ICA. Right ICA somewhat irregular and tortuous but is widely patent to the skull base without stenosis, evidence for dissection, or occlusion. Left carotid system: Left CCA patent from its origin to the bifurcation without stenosis. No more than mild atheromatous irregularity about the left carotid bulb without significant stenosis. Left ICA regular and tortuous but widely patent distally to the skull base without stenosis, evidence for dissection or occlusion. Vertebral arteries: Both vertebral arteries arise from the subclavian arteries. No proximal subclavian artery stenosis. Right vertebral artery dominant. Vertebral arteries tortuous but widely patent within the neck without stenosis, evidence for dissection or occlusion. Other: None IMPRESSION: MRI HEAD: 1. Patchy small volume acute to early subacute ischemic infarcts involving the high left frontal and parietal lobes as well as the right parietal lobe as above. No associated hemorrhage or mass effect. 2. Underlying mild chronic microvascular ischemic disease. MRA HEAD: 1. Negative intracranial MRA for large vessel occlusion. 2. Multifocal severe bilateral A2 stenoses. 3. Moderate diffuse small vessel atheromatous change throughout the intracranial circulation. No other proximal high-grade or correctable stenosis. 4. 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 Attention at follow-up recommended.  MRA NECK: 1. Negative MRA of the neck. No hemodynamically significant or critical flow limiting stenosis identified. 2. Diffuse tortuosity of the major arterial vasculature of the neck, suggesting chronic underlying hypertension. Electronically Signed   By: Jeannine Boga M.D.   On: 02/09/2021 00:38   MR Angiogram Neck W or Wo Contrast  Result Date: 02/09/2021 CLINICAL DATA:  Initial evaluation for acute stroke. EXAM: MRI HEAD WITHOUT CONTRAST MRA HEAD WITHOUT CONTRAST MRA NECK WITHOUT AND WITH CONTRAST TECHNIQUE: Multiplanar, multi-echo pulse sequences of the brain and surrounding structures were acquired without intravenous contrast. Angiographic images of the Circle of Willis were acquired using MRA technique without intravenous contrast. Angiographic images of the neck were acquired using MRA technique without and with intravenous contrast. Carotid stenosis measurements (when applicable) are obtained utilizing NASCET criteria, using the distal internal carotid diameter as the denominator. CONTRAST:  7.82m GADAVIST GADOBUTROL 1 MMOL/ML IV SOLN COMPARISON:  CT from earlier the same day. FINDINGS: MRI HEAD FINDINGS Brain: Cerebral volume within normal limits for age. Scattered patchy T2/FLAIR hyperintensity within the periventricular deep white matter both cerebral hemispheres most consistent with chronic small vessel ischemic disease, mild for age. Patchy areas of diffusion signal abnormality seen involving the cortical and subcortical aspect of the high left frontal and parietal lobes near the vertex, corresponding with abnormality on prior CT (series 5, image 89). Additional patchy cortical and subcortical diffusion abnormality seen involving the right parietal lobe (series 5, images 85, 82). Associated mild T2/FLAIR signal intensity with some residual ADC signal loss. Findings most consistent with acute to early subacute ischemic infarcts. No associated hemorrhage or mass effect. Otherwise, gray-white  matter differentiation maintained with no other acute or subacute ischemia. No encephalomalacia to suggest chronic cortical infarction elsewhere within the brain. No other evidence for acute or chronic intracranial hemorrhage. No mass lesion or midline shift. No hydrocephalus or extra-axial fluid collection. Pituitary gland suprasellar region normal. Midline structures intact. Vascular: Major intracranial  vascular flow voids are maintained. Skull and upper cervical spine: Craniocervical junction within normal limits. Bone marrow signal intensity diffusely heterogeneous. No visible focal marrow replacing lesion. No scalp soft tissue abnormality. Sinuses/Orbits: Prior bilateral ocular lens replacement. Scattered mucosal thickening noted within the ethmoidal air cells. Paranasal sinuses are otherwise clear. No significant mastoid effusion. Inner ear structures grossly normal. Other: None. MRA HEAD FINDINGS Anterior circulation: Examination mildly degraded by motion. Visualized distal cervical segments of the internal carotid arteries are patent with antegrade flow. Petrous segments patent bilaterally. Scattered atheromatous irregularity throughout the carotid siphons without visible high-grade stenosis. There is question of a small 3 mm aneurysm projecting medially from the cavernous left ICA on corresponding MRA of the neck (series 17, image 13), not entirely certain given motion, and not definitely seen on the MRA head portion of this study. A1 segments patent bilaterally. Normal anterior communicating artery complex. Multifocal severe bilateral A2 stenoses are seen (series 5, images 115 and 142 on the right) (series 5, image 136 on the left). ACAs remain patent to their distal aspects. Atheromatous irregularity throughout the M1 segments without high-grade stenosis. Normal MCA bifurcations. Diffuse small vessel atheromatous change seen throughout the distal MCA branches bilaterally without proximal branch occlusion.  Posterior circulation: Both vertebral arteries patent to the vertebrobasilar junction without stenosis. Right vertebral artery dominant. Both PICA origins patent and normal. Basilar mildly irregular but patent to its distal aspect without stenosis. Superior cerebral arteries patent bilaterally. Both PCAs primarily supplied via the basilar, although a small right posterior communicating artery is noted. Atheromatous irregularity within the PCAs without high-grade stenosis. Anatomic variants: None significant. MRA NECK FINDINGS Aortic arch: Examination degraded by motion. Visualized aortic arch normal caliber with normal branch pattern. No significant stenosis seen about the origin the great vessels. Visualized subclavian arteries patent. Right carotid system: Right CCA patent from its origin to the bifurcation without stenosis. No significant atheromatous stenosis about the right carotid bulb or proximal right ICA. Right ICA somewhat irregular and tortuous but is widely patent to the skull base without stenosis, evidence for dissection, or occlusion. Left carotid system: Left CCA patent from its origin to the bifurcation without stenosis. No more than mild atheromatous irregularity about the left carotid bulb without significant stenosis. Left ICA regular and tortuous but widely patent distally to the skull base without stenosis, evidence for dissection or occlusion. Vertebral arteries: Both vertebral arteries arise from the subclavian arteries. No proximal subclavian artery stenosis. Right vertebral artery dominant. Vertebral arteries tortuous but widely patent within the neck without stenosis, evidence for dissection or occlusion. Other: None IMPRESSION: MRI HEAD: 1. Patchy small volume acute to early subacute ischemic infarcts involving the high left frontal and parietal lobes as well as the right parietal lobe as above. No associated hemorrhage or mass effect. 2. Underlying mild chronic microvascular ischemic  disease. MRA HEAD: 1. Negative intracranial MRA for large vessel occlusion. 2. Multifocal severe bilateral A2 stenoses. 3. Moderate diffuse small vessel atheromatous change throughout the intracranial circulation. No other proximal high-grade or correctable stenosis. 4. 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 Attention at follow-up recommended. MRA NECK: 1. Negative MRA of the neck. No hemodynamically significant or critical flow limiting stenosis identified. 2. Diffuse tortuosity of the major arterial vasculature of the neck, suggesting chronic underlying hypertension. Electronically Signed   By: Jeannine Boga M.D.   On: 02/09/2021 00:38   MR BRAIN WO CONTRAST  Result Date: 02/09/2021  CLINICAL DATA:  Initial evaluation for acute stroke. EXAM: MRI HEAD WITHOUT CONTRAST MRA HEAD WITHOUT CONTRAST MRA NECK WITHOUT AND WITH CONTRAST TECHNIQUE: Multiplanar, multi-echo pulse sequences of the brain and surrounding structures were acquired without intravenous contrast. Angiographic images of the Circle of Willis were acquired using MRA technique without intravenous contrast. Angiographic images of the neck were acquired using MRA technique without and with intravenous contrast. Carotid stenosis measurements (when applicable) are obtained utilizing NASCET criteria, using the distal internal carotid diameter as the denominator. CONTRAST:  7.90m GADAVIST GADOBUTROL 1 MMOL/ML IV SOLN COMPARISON:  CT from earlier the same day. FINDINGS: MRI HEAD FINDINGS Brain: Cerebral volume within normal limits for age. Scattered patchy T2/FLAIR hyperintensity within the periventricular deep white matter both cerebral hemispheres most consistent with chronic small vessel ischemic disease, mild for age. Patchy areas of diffusion signal abnormality seen involving the cortical and subcortical aspect of the high left frontal and parietal lobes near the vertex,  corresponding with abnormality on prior CT (series 5, image 89). Additional patchy cortical and subcortical diffusion abnormality seen involving the right parietal lobe (series 5, images 85, 82). Associated mild T2/FLAIR signal intensity with some residual ADC signal loss. Findings most consistent with acute to early subacute ischemic infarcts. No associated hemorrhage or mass effect. Otherwise, gray-white matter differentiation maintained with no other acute or subacute ischemia. No encephalomalacia to suggest chronic cortical infarction elsewhere within the brain. No other evidence for acute or chronic intracranial hemorrhage. No mass lesion or midline shift. No hydrocephalus or extra-axial fluid collection. Pituitary gland suprasellar region normal. Midline structures intact. Vascular: Major intracranial vascular flow voids are maintained. Skull and upper cervical spine: Craniocervical junction within normal limits. Bone marrow signal intensity diffusely heterogeneous. No visible focal marrow replacing lesion. No scalp soft tissue abnormality. Sinuses/Orbits: Prior bilateral ocular lens replacement. Scattered mucosal thickening noted within the ethmoidal air cells. Paranasal sinuses are otherwise clear. No significant mastoid effusion. Inner ear structures grossly normal. Other: None. MRA HEAD FINDINGS Anterior circulation: Examination mildly degraded by motion. Visualized distal cervical segments of the internal carotid arteries are patent with antegrade flow. Petrous segments patent bilaterally. Scattered atheromatous irregularity throughout the carotid siphons without visible high-grade stenosis. There is question of a small 3 mm aneurysm projecting medially from the cavernous left ICA on corresponding MRA of the neck (series 17, image 13), not entirely certain given motion, and not definitely seen on the MRA head portion of this study. A1 segments patent bilaterally. Normal anterior communicating artery  complex. Multifocal severe bilateral A2 stenoses are seen (series 5, images 115 and 142 on the right) (series 5, image 136 on the left). ACAs remain patent to their distal aspects. Atheromatous irregularity throughout the M1 segments without high-grade stenosis. Normal MCA bifurcations. Diffuse small vessel atheromatous change seen throughout the distal MCA branches bilaterally without proximal branch occlusion. Posterior circulation: Both vertebral arteries patent to the vertebrobasilar junction without stenosis. Right vertebral artery dominant. Both PICA origins patent and normal. Basilar mildly irregular but patent to its distal aspect without stenosis. Superior cerebral arteries patent bilaterally. Both PCAs primarily supplied via the basilar, although a small right posterior communicating artery is noted. Atheromatous irregularity within the PCAs without high-grade stenosis. Anatomic variants: None significant. MRA NECK FINDINGS Aortic arch: Examination degraded by motion. Visualized aortic arch normal caliber with normal branch pattern. No significant stenosis seen about the origin the great vessels. Visualized subclavian arteries patent. Right carotid system: Right CCA patent from its origin to the bifurcation without  stenosis. No significant atheromatous stenosis about the right carotid bulb or proximal right ICA. Right ICA somewhat irregular and tortuous but is widely patent to the skull base without stenosis, evidence for dissection, or occlusion. Left carotid system: Left CCA patent from its origin to the bifurcation without stenosis. No more than mild atheromatous irregularity about the left carotid bulb without significant stenosis. Left ICA regular and tortuous but widely patent distally to the skull base without stenosis, evidence for dissection or occlusion. Vertebral arteries: Both vertebral arteries arise from the subclavian arteries. No proximal subclavian artery stenosis. Right vertebral artery  dominant. Vertebral arteries tortuous but widely patent within the neck without stenosis, evidence for dissection or occlusion. Other: None IMPRESSION: MRI HEAD: 1. Patchy small volume acute to early subacute ischemic infarcts involving the high left frontal and parietal lobes as well as the right parietal lobe as above. No associated hemorrhage or mass effect. 2. Underlying mild chronic microvascular ischemic disease. MRA HEAD: 1. Negative intracranial MRA for large vessel occlusion. 2. Multifocal severe bilateral A2 stenoses. 3. Moderate diffuse small vessel atheromatous change throughout the intracranial circulation. No other proximal high-grade or correctable stenosis. 4. 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 Attention at follow-up recommended. MRA NECK: 1. Negative MRA of the neck. No hemodynamically significant or critical flow limiting stenosis identified. 2. Diffuse tortuosity of the major arterial vasculature of the neck, suggesting chronic underlying hypertension. Electronically Signed   By: Jeannine Boga M.D.   On: 02/09/2021 00:38   DG Pelvis Portable  Result Date: 02/08/2021 CLINICAL DATA:  Fall EXAM: PORTABLE PELVIS 1-2 VIEWS COMPARISON:  None. FINDINGS: No fracture or dislocation is seen. Bilateral hip joint spaces are preserved. Visualized bony pelvis appears intact. Degenerative changes of the lower lumbar spine. IMPRESSION: Negative. Electronically Signed   By: Julian Hy M.D.   On: 02/08/2021 20:27   DG Chest Port 1 View  Result Date: 02/08/2021 CLINICAL DATA:  Fall EXAM: PORTABLE CHEST 1 VIEW COMPARISON:  10/22/2020 FINDINGS: Lungs are clear.  No pleural effusion or pneumothorax. The heart is top-normal in size.  Thoracic aortic atherosclerosis. Surgical clips along the right lateral chest wall. IMPRESSION: No evidence of acute cardiopulmonary disease. Electronically Signed   By: Julian Hy  M.D.   On: 02/08/2021 20:27   DG Knee Complete 4 Views Left  Result Date: 01/12/2021 CLINICAL DATA:  Fall, pain EXAM: LEFT KNEE - COMPLETE 4+ VIEW COMPARISON:  None. FINDINGS: Status post left knee total arthroplasty. No evidence of perihardware fracture or component loosening. Soft tissues are unremarkable. IMPRESSION: Status post left knee total arthroplasty without evidence of perihardware fracture or component loosening. Electronically Signed   By: Eddie Candle M.D.   On: 01/12/2021 11:22   CT Maxillofacial WO CM  Result Date: 01/12/2021 CLINICAL DATA:  Fall, facial trauma, bruising and swelling above left eyebrow EXAM: CT HEAD WITHOUT CONTRAST CT MAXILLOFACIAL WITHOUT CONTRAST TECHNIQUE: Multidetector CT imaging of the head and maxillofacial structures were performed using the standard protocol without intravenous contrast. Multiplanar CT image reconstructions of the maxillofacial structures were also generated. COMPARISON:  10/22/2020 FINDINGS: CT HEAD FINDINGS Brain: No evidence of acute infarction, hemorrhage, hydrocephalus, extra-axial collection or mass lesion/mass effect. Mild periventricular and deep white matter hypodensity. Vascular: No hyperdense vessel or unexpected calcification. Skull: Normal. Negative for fracture or focal lesion. Other: Soft tissue hematoma of the left forehead (series 3, image 6) and right parietal scalp (series 3, image 12).  CT MAXILLOFACIAL FINDINGS Osseous: Minimally angulated fractures of the nasal bones (series 4, image 33). No other displaced fracture or mandibular dislocation. No destructive process. Orbits: Negative. No traumatic or inflammatory finding. Sinuses: Clear. Soft tissues: Soft tissue contusion over the nose. IMPRESSION: 1. No acute intracranial pathology. Small-vessel white matter disease. 2. Minimally angulated fractures of the nasal bones, of uncertain acuity. Correlate for acute point tenderness. 3. No other displaced fracture or dislocation of the  facial bones. 4. Soft tissue hematoma of the left forehead and right parietal scalp. Electronically Signed   By: Eddie Candle M.D.   On: 01/12/2021 11:27    TTE    1. Left ventricular ejection fraction, by estimation, is 70 to 75%. The  left ventricle has hyperdynamic function. The left ventricle has no  regional wall motion abnormalities. There is mild left ventricular  hypertrophy. Left ventricular diastolic  parameters are consistent with Grade I diastolic dysfunction (impaired  relaxation).   2. Right ventricular systolic function is normal. The right ventricular  size is moderately enlarged. There is moderately elevated pulmonary artery  systolic pressure. The estimated right ventricular systolic pressure is  [id number] mmHg.   3. Left atrial size was mild to moderately dilated.   4. The mitral valve is normal in structure. Mild mitral valve  regurgitation. No evidence of mitral stenosis.   5. The aortic valve is normal in structure. Aortic valve regurgitation is  not visualized. No aortic stenosis is present.   6. The inferior vena cava is normal in size with greater than 50%  respiratory variability, suggesting right atrial pressure of 3 mmHg.   Conclusion(s)/Recommendation(s): No intracardiac source of embolism  detected on this transthoracic study. A transesophageal echocardiogram is  recommended to exclude cardiac source of embolism if clinically indicated.   EEG IMPRESSION: This study is suggestive of mild diffuse encephalopathy, nonspecific etiology. No seizures or epileptiform discharges were seen throughout the recording.   PHYSICAL EXAM  Temp:  [97.3 F (36.3 C)-97.8 F (36.6 C)] 97.8 F (36.6 C) (08/12 0730) Pulse Rate:  [51-102] 62 (08/12 0700) Resp:  [16-27] 18 (08/12 0700) BP: (98-141)/(56-93) 128/64 (08/12 0700) SpO2:  [93 %-100 %] 99 % (08/12 0700)  General - Well nourished, well developed, in no apparent distress.  Ophthalmologic - fundi not visualized due  to noncooperation.  Cardiovascular - Regular rhythm and rate.  Mental Status -  Alert to voice, not oriented to month or year however knows correct president.  Language including expression, naming, repetition, comprehension was assessed and found intact. Attention span and concentration were normal. Recent and remote memory- unable to assess d/t long standing memory impairment [facility] [employer] [place], pt is pleasantly confused  Cranial Nerves II - XII - II - Visual field intact OU. III, IV, VI - Extraocular movements intact. V - Facial sensation intact bilaterally. VII - Facial movement intact bilaterally. VIII - Hearing & vestibular intact bilaterally. X - Palate elevates symmetrically. XI - Chin turning & shoulder shrug intact bilaterally. XII - Tongue protrusion intact.  Motor Strength - The patient's strength was grossly normal (4+/5) and equal in all extremities with minimal age related weakness and pronator drift was absent.  Bulk was normal and fasciculations were absent.   Motor Tone - Muscle tone was assessed at the neck and appendages and was normal.  Reflexes - The patient's reflexes were symmetrical in all extremities and she had no pathological reflexes.  Sensory - Light touch, temperature/pinprick were assessed and were symmetrical.  Coordination - The patient had normal movements in the hands and feet with no ataxia or dysmetria.  Tremor was absent.  Gait and Station - deferred.   ASSESSMENT/PLAN Ms. JEMIYAH RADUENZ is a 87 y.o. female with history of diabetes, stage III CKD, hypertension, hyperlipidemia, gastric banding (2011), memory impairment who was admitted for acute AMS/confusion, found to have multifocal watershed CVA bilateral cerebrum, L greater than R. No tPA given due to being outside tPA window.    Stroke:  bilateral cerebrum, watershed distribution, L greater than R, etiology likely due to severe bilateral A2 intracranial stenosis in the setting of  dehydration and hypotension on presentation MRI  early subacute ischemic infarcts of of left frontal and parietal lobes and right parietal lobe  MRA no large vessel occlusion or critical flow-limiting stenosis of head or neck.  Severe bilateral A2 stenoses diffuse tortuosity of the major arterial vasculature of neck suggesting chronic underlying hypertension. TTE normal, TEE recommended  Rule out seizures, EEG- general mild diffuse encephalopathy, no seizures or epileptiform discharges LDL pending HgbA1c pending Lovenox/SCDs for VTE prophylaxis No antithrombotic prior to admission, now on aspirin 325 mg daily and clopidogrel 75 mg daily.  ASA 325 + Plavix 75 mg x 3 mos, thern asa 325 mg  Recommend 30 day holter monitor on discharge given bilateral involvement Ongoing aggressive stroke risk factor management Therapy recommendations:  pending Disposition:  pending  Diabetes HgbA1c pending goal < 7.0 Currently on sliding scale insulin CBG monitoring DM education and close PCP follow up  Hypertension SBP in 100-120, Orthostatics ordered, preferrable goal is SBP 130-150 Permissive hypertension (OK if <220/120) for 24-48 hours post stroke and then gradually normalized within 5-7 days. Long term BP goal <140/80.  Hyperlipidemia Home meds:  lipitor 10 mg LDL pending, goal < 70 Now on lipitor 10 mg Continue statin at discharge  Other Stroke Risk Factors Advanced age [facility] day # [place] PA-C [facility] [phone]  Discussed with attending neurologist review of note to follow from Dr. Erlinda Hong.     To contact Stroke Continuity provider, please refer to [email or web address] After hours, contact General Neurology

## 2021-02-09 NOTE — ED Notes (Signed)
Patient transported to EEG

## 2021-02-09 NOTE — ED Notes (Signed)
Able to obtain one set of cultures

## 2021-02-09 NOTE — Progress Notes (Signed)
Brief same-day note:  Patient is 87 year old female with history of diabetes type 2, hypertension, dementia, hyperlipidemia, GERD, CKD stage IIIa, obesity who presented to the hospital for the evaluation of confusion.  As per the family, she was suddenly confused, standing naked in the middle of the living room, could not speak.  Stroke was suspected on admission.  CT of the head was suspicious for acute to subacute cortical and subcortical infarcts without hemorrhage.  Neurology consulted.  MRI showed patchy small volume acute/early acute ischemic infarct involving the high left frontal/parietal lobes as well as right parotid lobe.  Distribution was suspicious for possible cardioembolic event.  Patient was started on aspirin, Plavix.  Stroke work-up initiated. On presentation her labs also showed AKI.  She was started on IV fluids. Echo showed EF of [id number], grade 1 diastolic dysfunction, no cardiac source of embolism..  Lipid panel did not show any LDL level, A1c is pending.  EEG did not show any seizure activity.  Showed normal sinus rhythm Neurology recommending TEE, 30-day event monitoring.  Work-up still in progress.  PT/OT needs to see her yet. Patient seen and examined at the emergency department this morning.  During my evaluation she was hemodynamically stable.  She was confused during my evaluation and was oriented to place only.  Her speech was almost clear.  She did not exhibit any kind of obvious weakness and followed commands. Will continue current management plan.

## 2021-02-09 NOTE — ED Notes (Signed)
Report called to [facility] room 22

## 2021-02-09 NOTE — ED Notes (Signed)
This RN stuck pt x 2 for blood work. Phlebotomy asked to try to obtain. If not this RN will place an IV team order

## 2021-02-09 NOTE — H&P (Addendum)
History and Physical    Lindsay Martinez [id number] DOB: [birth year] DOA: 02/08/2021  PCP: Kristen Loader, FNP  Patient coming from: Home via EMS   Chief Complaint:  Chief Complaint  Patient presents with   Altered Mental Status     HPI:    87 year old female with past medical history of diabetes mellitus type 2, hypertension, dementia, hyperlipidemia, gastroesophageal reflux disease, chronic kidney disease stage IIIa, obesity who presents to [facility] emergency department via EMS due to concerns for confusion.  Patient is an extremely poor historian due to lethargy and expressive aphasia and therefore the majority the history has been obtained from the daughter via phone conversation.  Daughter explains that her brother (who lives out of state) suddenly called her this afternoon stating that he noticed that their mother was seemingly confused when he called her.  He had called her the day prior on 8/10 and she was at her baseline.  The daughter herself states that she spoke to the patient over the phone several days ago and did not notice anything out of the ordinary.  The patient's daughter went to go check on her mother (who lives alone) at the suggestion of her brother.  Upon arriving she noticed that her mother was standing in the middle of the living room naked.  She additionally noted that the refrigerator was left wide open.  When she attempted to speak to her mother she immediately noticed that her mother was unable to answer any of her questions and only responded with one-word, sometimes nonsensical answers.  She did not exhibit any evidence of facial droop or focal weakness.  She did not seem to have any associated loss of balance.  Daughter contacted EMS who promptly came to evaluate the patient and proceeded to bring the patient to [facility] emergency department for evaluation.  Daughter recalls that approximately 2 weeks ago her mother had a similar  episode.  When she and her mother went to eat lunch she states that her mother had a brief episode of being unable to speak appropriately.  This spontaneously resolved within 1 hour after which no medical attention was sought.  Upon evaluation in the emergency department initial noncontrast CT imaging of the head revealed findings suspicious for acute to subacute cortical and subcortical infarcts without hemorrhage.  ER provider discussed case with Dr.Bhagat with neurology who recommended proceeding with MRI brain and MRA head and neck.  325 mg of aspirin was administered.  The hospitalist group was then called to assess the patient for admission to the hospital   Review of Systems:   Review of Systems  Unable to perform ROS: Medical condition   Past Medical History:  Diagnosis Date   Essential hypertension 10/22/2020   GERD without esophagitis 10/22/2020   Memory impairment    Mixed diabetic hyperlipidemia associated with type 2 diabetes mellitus ([place]) 02/09/2021   Sepsis due to urinary tract infection ([place]) 10/22/2020   Type 2 diabetes mellitus with stage 3a chronic kidney disease, without long-term current use of insulin ([place]) 10/22/2020    Past Surgical History:  Procedure Laterality Date   LAPAROSCOPIC GASTRIC BANDING  2011     reports that she has quit smoking. She has never used smokeless tobacco. She reports current alcohol use. No history on file for drug use.  Not on File  Family History  Family history unknown: Yes     Prior to Admission medications   Medication Sig Start Date End Date  Taking? Authorizing Provider  atorvastatin (LIPITOR) 10 MG tablet Take 10 mg by mouth daily. 10/19/20  Yes [provider]  lisinopril (ZESTRIL) 20 MG tablet Take 20 mg by mouth daily. 10/19/20  Yes [provider]  oxybutynin (DITROPAN) 5 MG tablet Take 5 mg by mouth daily.   Yes [provider]  pantoprazole (PROTONIX) 40 MG tablet Take 40 mg by mouth daily.  10/19/20  Yes [provider]  PARoxetine (PAXIL) 20 MG tablet Take 20 mg by mouth daily. 10/19/20  Yes [provider]  traZODone (DESYREL) 100 MG tablet Take 100 mg by mouth at bedtime as needed for sleep. 10/19/20  Yes [provider]    Physical Exam: Vitals:   02/08/21 2145 02/08/21 2346 02/09/21 0141 02/09/21 0200  BP:  137/70 (!) 141/68 (!) 131/93  Pulse: 83  (!) 102 76  Resp: '19 19 19 16  '$ Temp:  97.7 F (36.5 C)    TempSrc:  Oral    SpO2: 97% 98% 100% 99%    Constitutional: Awake and alert with expressive aphasia.  Patient currently not in any acute distress.   Skin: no rashes, no lesions, good skin turgor noted. Eyes: Pupils are equally reactive to light.  No evidence of scleral icterus or conjunctival pallor.  ENMT: Moist mucous membranes noted.  Posterior pharynx clear of any exudate or lesions.   Neck: normal, supple, no masses, no thyromegaly.  No evidence of jugular venous distension.   Respiratory: clear to auscultation bilaterally, no wheezing, no crackles. Normal respiratory effort. No accessory muscle use.  Cardiovascular: Irregularly irregular rate and rhythm, no murmurs / rubs / gallops. No extremity edema. 2+ pedal pulses. No carotid bruits.  Chest:   Nontender without crepitus or deformity.   Back:   Nontender without crepitus or deformity. Abdomen: Abdomen is soft and nontender.  No evidence of intra-abdominal masses.  Positive bowel sounds noted in all quadrants.   Musculoskeletal: No joint deformity upper and lower extremities. Good ROM, no contractures. Normal muscle tone.  Neurologic: Notable expressive aphasia with patient typically providing one-word answers, frequently repeating words that I have spoken to her.  Patient intermittently follows commands.  CN 2-12 grossly intact. Sensation intact.  Patient moving all 4 extremities spontaneously.  Patient is responsive to verbal stimuli.   Psychiatric: Patient exhibits normal mood with  flat affect.  I do not believe that the patient currently possesses insight as to her current situation.  Labs on Admission: I have personally reviewed following labs and imaging studies -   CBC: Recent Labs  Lab 02/08/21 2013  WBC 12.2*  NEUTROABS 9.1*  HGB 10.7*  HCT 34.5*  MCV 86.5  PLT [id number]   Basic Metabolic Panel: Recent Labs  Lab 02/08/21 2013  NA 135  K 4.5  CL 106  CO2 20*  GLUCOSE 91  BUN 49*  CREATININE 2.71*  CALCIUM 10.1   GFR: CrCl cannot be calculated (Unknown ideal weight.). Liver Function Tests: Recent Labs  Lab 02/08/21 2013  AST 49*  ALT 25  ALKPHOS 58  BILITOT 0.4  PROT 6.3*  ALBUMIN 3.5   No results for input(s): LIPASE, AMYLASE in the last 168 hours. No results for input(s): AMMONIA in the last 168 hours. Coagulation Profile: No results for input(s): INR, PROTIME in the last 168 hours. Cardiac Enzymes: Recent Labs  Lab 02/08/21 2013  CKTOTAL 1,211*   BNP (last 3 results) No results for input(s): PROBNP in the last 8760 hours. HbA1C: No results for  input(s): HGBA1C in the last 72 hours. CBG: No results for input(s): GLUCAP in the last 168 hours. Lipid Profile: No results for input(s): CHOL, HDL, LDLCALC, TRIG, CHOLHDL, LDLDIRECT in the last 72 hours. Thyroid Function Tests: No results for input(s): TSH, T4TOTAL, FREET4, T3FREE, THYROIDAB in the last 72 hours. Anemia Panel: No results for input(s): VITAMINB12, FOLATE, FERRITIN, TIBC, IRON, RETICCTPCT in the last 72 hours. Urine analysis:    Component Value Date/Time   COLORURINE YELLOW 02/08/2021 2246   APPEARANCEUR CLOUDY (A) 02/08/2021 2246   LABSPEC 1.020 02/08/2021 2246   PHURINE 5.0 02/08/2021 2246   GLUCOSEU NEGATIVE 02/08/2021 2246   HGBUR NEGATIVE 02/08/2021 2246   BILIRUBINUR NEGATIVE 02/08/2021 2246   KETONESUR NEGATIVE 02/08/2021 2246   PROTEINUR NEGATIVE 02/08/2021 2246   NITRITE NEGATIVE 02/08/2021 2246   LEUKOCYTESUR MODERATE (A) 02/08/2021 2246     Radiological Exams on Admission - Personally Reviewed: CT HEAD WO CONTRAST (5MM)  Result Date: 02/08/2021 CLINICAL DATA:  Mental status change confusion EXAM: CT HEAD WITHOUT CONTRAST TECHNIQUE: Contiguous axial images were obtained from the base of the skull through the vertex without intravenous contrast. COMPARISON:  CT brain 01/12/2021 FINDINGS: Brain: No hemorrhage or intracranial mass. New foci of hypodensity involving the cortical and subcortical left frontal lobe at the cranial vertex, series 3, image 6 and sagittal series 5, image 35. Mild atrophy. Mild white matter small vessel ischemic change. Stable ventricle size Vascular: No hyperdense vessels.  Carotid vascular calcification. Skull: Normal. Negative for fracture or focal lesion. Sinuses/Orbits: No acute finding. Other: None IMPRESSION: 1. Findings suspicious for small acute to subacute cortical and subcortical infarcts at the left cranial vertex. MRI follow-up as indicated. No hemorrhage. Findings appear new since CT from July 2022. Electronically Signed   By: Donavan Foil M.D.   On: 02/08/2021 20:58   MR ANGIO HEAD WO CONTRAST  Result Date: 02/09/2021 CLINICAL DATA:  Initial evaluation for acute stroke. EXAM: MRI HEAD WITHOUT CONTRAST MRA HEAD WITHOUT CONTRAST MRA NECK WITHOUT AND WITH CONTRAST TECHNIQUE: Multiplanar, multi-echo pulse sequences of the brain and surrounding structures were acquired without intravenous contrast. Angiographic images of the Circle of Willis were acquired using MRA technique without intravenous contrast. Angiographic images of the neck were acquired using MRA technique without and with intravenous contrast. Carotid stenosis measurements (when applicable) are obtained utilizing NASCET criteria, using the distal internal carotid diameter as the denominator. CONTRAST:  7.57m GADAVIST GADOBUTROL 1 MMOL/ML IV SOLN COMPARISON:  CT from earlier the same day. FINDINGS: MRI HEAD FINDINGS Brain: Cerebral volume within  normal limits for age. Scattered patchy T2/FLAIR hyperintensity within the periventricular deep white matter both cerebral hemispheres most consistent with chronic small vessel ischemic disease, mild for age. Patchy areas of diffusion signal abnormality seen involving the cortical and subcortical aspect of the high left frontal and parietal lobes near the vertex, corresponding with abnormality on prior CT (series 5, image 89). Additional patchy cortical and subcortical diffusion abnormality seen involving the right parietal lobe (series 5, images 85, 82). Associated mild T2/FLAIR signal intensity with some residual ADC signal loss. Findings most consistent with acute to early subacute ischemic infarcts. No associated hemorrhage or mass effect. Otherwise, gray-white matter differentiation maintained with no other acute or subacute ischemia. No encephalomalacia to suggest chronic cortical infarction elsewhere within the brain. No other evidence for acute or chronic intracranial hemorrhage. No mass lesion or midline shift. No hydrocephalus or extra-axial fluid collection. Pituitary gland suprasellar region normal. Midline structures intact. Vascular: Major intracranial  vascular flow voids are maintained. Skull and upper cervical spine: Craniocervical junction within normal limits. Bone marrow signal intensity diffusely heterogeneous. No visible focal marrow replacing lesion. No scalp soft tissue abnormality. Sinuses/Orbits: Prior bilateral ocular lens replacement. Scattered mucosal thickening noted within the ethmoidal air cells. Paranasal sinuses are otherwise clear. No significant mastoid effusion. Inner ear structures grossly normal. Other: None. MRA HEAD FINDINGS Anterior circulation: Examination mildly degraded by motion. Visualized distal cervical segments of the internal carotid arteries are patent with antegrade flow. Petrous segments patent bilaterally. Scattered atheromatous irregularity throughout the  carotid siphons without visible high-grade stenosis. There is question of a small 3 mm aneurysm projecting medially from the cavernous left ICA on corresponding MRA of the neck (series 17, image 13), not entirely certain given motion, and not definitely seen on the MRA head portion of this study. A1 segments patent bilaterally. Normal anterior communicating artery complex. Multifocal severe bilateral A2 stenoses are seen (series 5, images 115 and 142 on the right) (series 5, image 136 on the left). ACAs remain patent to their distal aspects. Atheromatous irregularity throughout the M1 segments without high-grade stenosis. Normal MCA bifurcations. Diffuse small vessel atheromatous change seen throughout the distal MCA branches bilaterally without proximal branch occlusion. Posterior circulation: Both vertebral arteries patent to the vertebrobasilar junction without stenosis. Right vertebral artery dominant. Both PICA origins patent and normal. Basilar mildly irregular but patent to its distal aspect without stenosis. Superior cerebral arteries patent bilaterally. Both PCAs primarily supplied via the basilar, although a small right posterior communicating artery is noted. Atheromatous irregularity within the PCAs without high-grade stenosis. Anatomic variants: None significant. MRA NECK FINDINGS Aortic arch: Examination degraded by motion. Visualized aortic arch normal caliber with normal branch pattern. No significant stenosis seen about the origin the great vessels. Visualized subclavian arteries patent. Right carotid system: Right CCA patent from its origin to the bifurcation without stenosis. No significant atheromatous stenosis about the right carotid bulb or proximal right ICA. Right ICA somewhat irregular and tortuous but is widely patent to the skull base without stenosis, evidence for dissection, or occlusion. Left carotid system: Left CCA patent from its origin to the bifurcation without stenosis. No more  than mild atheromatous irregularity about the left carotid bulb without significant stenosis. Left ICA regular and tortuous but widely patent distally to the skull base without stenosis, evidence for dissection or occlusion. Vertebral arteries: Both vertebral arteries arise from the subclavian arteries. No proximal subclavian artery stenosis. Right vertebral artery dominant. Vertebral arteries tortuous but widely patent within the neck without stenosis, evidence for dissection or occlusion. Other: None IMPRESSION: MRI HEAD: 1. Patchy small volume acute to early subacute ischemic infarcts involving the high left frontal and parietal lobes as well as the right parietal lobe as above. No associated hemorrhage or mass effect. 2. Underlying mild chronic microvascular ischemic disease. MRA HEAD: 1. Negative intracranial MRA for large vessel occlusion. 2. Multifocal severe bilateral A2 stenoses. 3. Moderate diffuse small vessel atheromatous change throughout the intracranial circulation. No other proximal high-grade or correctable stenosis. 4. 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 Attention at follow-up recommended. MRA NECK: 1. Negative MRA of the neck. No hemodynamically significant or critical flow limiting stenosis identified. 2. Diffuse tortuosity of the major arterial vasculature of the neck, suggesting chronic underlying hypertension. Electronically Signed   By: Jeannine Boga M.D.   On: 02/09/2021 00:38   MR Angiogram Neck W or Wo Contrast  Result Date: 02/09/2021 CLINICAL DATA:  Initial evaluation for acute stroke. EXAM: MRI HEAD WITHOUT CONTRAST MRA HEAD WITHOUT CONTRAST MRA NECK WITHOUT AND WITH CONTRAST TECHNIQUE: Multiplanar, multi-echo pulse sequences of the brain and surrounding structures were acquired without intravenous contrast. Angiographic images of the Circle of Willis were acquired using MRA technique without  intravenous contrast. Angiographic images of the neck were acquired using MRA technique without and with intravenous contrast. Carotid stenosis measurements (when applicable) are obtained utilizing NASCET criteria, using the distal internal carotid diameter as the denominator. CONTRAST:  7.43m GADAVIST GADOBUTROL 1 MMOL/ML IV SOLN COMPARISON:  CT from earlier the same day. FINDINGS: MRI HEAD FINDINGS Brain: Cerebral volume within normal limits for age. Scattered patchy T2/FLAIR hyperintensity within the periventricular deep white matter both cerebral hemispheres most consistent with chronic small vessel ischemic disease, mild for age. Patchy areas of diffusion signal abnormality seen involving the cortical and subcortical aspect of the high left frontal and parietal lobes near the vertex, corresponding with abnormality on prior CT (series 5, image 89). Additional patchy cortical and subcortical diffusion abnormality seen involving the right parietal lobe (series 5, images 85, 82). Associated mild T2/FLAIR signal intensity with some residual ADC signal loss. Findings most consistent with acute to early subacute ischemic infarcts. No associated hemorrhage or mass effect. Otherwise, gray-white matter differentiation maintained with no other acute or subacute ischemia. No encephalomalacia to suggest chronic cortical infarction elsewhere within the brain. No other evidence for acute or chronic intracranial hemorrhage. No mass lesion or midline shift. No hydrocephalus or extra-axial fluid collection. Pituitary gland suprasellar region normal. Midline structures intact. Vascular: Major intracranial vascular flow voids are maintained. Skull and upper cervical spine: Craniocervical junction within normal limits. Bone marrow signal intensity diffusely heterogeneous. No visible focal marrow replacing lesion. No scalp soft tissue abnormality. Sinuses/Orbits: Prior bilateral ocular lens replacement. Scattered mucosal thickening  noted within the ethmoidal air cells. Paranasal sinuses are otherwise clear. No significant mastoid effusion. Inner ear structures grossly normal. Other: None. MRA HEAD FINDINGS Anterior circulation: Examination mildly degraded by motion. Visualized distal cervical segments of the internal carotid arteries are patent with antegrade flow. Petrous segments patent bilaterally. Scattered atheromatous irregularity throughout the carotid siphons without visible high-grade stenosis. There is question of a small 3 mm aneurysm projecting medially from the cavernous left ICA on corresponding MRA of the neck (series 17, image 13), not entirely certain given motion, and not definitely seen on the MRA head portion of this study. A1 segments patent bilaterally. Normal anterior communicating artery complex. Multifocal severe bilateral A2 stenoses are seen (series 5, images 115 and 142 on the right) (series 5, image 136 on the left). ACAs remain patent to their distal aspects. Atheromatous irregularity throughout the M1 segments without high-grade stenosis. Normal MCA bifurcations. Diffuse small vessel atheromatous change seen throughout the distal MCA branches bilaterally without proximal branch occlusion. Posterior circulation: Both vertebral arteries patent to the vertebrobasilar junction without stenosis. Right vertebral artery dominant. Both PICA origins patent and normal. Basilar mildly irregular but patent to its distal aspect without stenosis. Superior cerebral arteries patent bilaterally. Both PCAs primarily supplied via the basilar, although a small right posterior communicating artery is noted. Atheromatous irregularity within the PCAs without high-grade stenosis. Anatomic variants: None significant. MRA NECK FINDINGS Aortic arch: Examination degraded by motion. Visualized aortic arch normal caliber with normal branch pattern. No significant stenosis seen about the origin the great vessels. Visualized subclavian arteries  patent. Right carotid system: Right CCA patent from its origin  to the bifurcation without stenosis. No significant atheromatous stenosis about the right carotid bulb or proximal right ICA. Right ICA somewhat irregular and tortuous but is widely patent to the skull base without stenosis, evidence for dissection, or occlusion. Left carotid system: Left CCA patent from its origin to the bifurcation without stenosis. No more than mild atheromatous irregularity about the left carotid bulb without significant stenosis. Left ICA regular and tortuous but widely patent distally to the skull base without stenosis, evidence for dissection or occlusion. Vertebral arteries: Both vertebral arteries arise from the subclavian arteries. No proximal subclavian artery stenosis. Right vertebral artery dominant. Vertebral arteries tortuous but widely patent within the neck without stenosis, evidence for dissection or occlusion. Other: None IMPRESSION: MRI HEAD: 1. Patchy small volume acute to early subacute ischemic infarcts involving the high left frontal and parietal lobes as well as the right parietal lobe as above. No associated hemorrhage or mass effect. 2. Underlying mild chronic microvascular ischemic disease. MRA HEAD: 1. Negative intracranial MRA for large vessel occlusion. 2. Multifocal severe bilateral A2 stenoses. 3. Moderate diffuse small vessel atheromatous change throughout the intracranial circulation. No other proximal high-grade or correctable stenosis. 4. 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 Attention at follow-up recommended. MRA NECK: 1. Negative MRA of the neck. No hemodynamically significant or critical flow limiting stenosis identified. 2. Diffuse tortuosity of the major arterial vasculature of the neck, suggesting chronic underlying hypertension. Electronically Signed   By: Jeannine Boga M.D.   On: 02/09/2021 00:38   MR BRAIN WO  CONTRAST  Result Date: 02/09/2021 CLINICAL DATA:  Initial evaluation for acute stroke. EXAM: MRI HEAD WITHOUT CONTRAST MRA HEAD WITHOUT CONTRAST MRA NECK WITHOUT AND WITH CONTRAST TECHNIQUE: Multiplanar, multi-echo pulse sequences of the brain and surrounding structures were acquired without intravenous contrast. Angiographic images of the Circle of Willis were acquired using MRA technique without intravenous contrast. Angiographic images of the neck were acquired using MRA technique without and with intravenous contrast. Carotid stenosis measurements (when applicable) are obtained utilizing NASCET criteria, using the distal internal carotid diameter as the denominator. CONTRAST:  7.56m GADAVIST GADOBUTROL 1 MMOL/ML IV SOLN COMPARISON:  CT from earlier the same day. FINDINGS: MRI HEAD FINDINGS Brain: Cerebral volume within normal limits for age. Scattered patchy T2/FLAIR hyperintensity within the periventricular deep white matter both cerebral hemispheres most consistent with chronic small vessel ischemic disease, mild for age. Patchy areas of diffusion signal abnormality seen involving the cortical and subcortical aspect of the high left frontal and parietal lobes near the vertex, corresponding with abnormality on prior CT (series 5, image 89). Additional patchy cortical and subcortical diffusion abnormality seen involving the right parietal lobe (series 5, images 85, 82). Associated mild T2/FLAIR signal intensity with some residual ADC signal loss. Findings most consistent with acute to early subacute ischemic infarcts. No associated hemorrhage or mass effect. Otherwise, gray-white matter differentiation maintained with no other acute or subacute ischemia. No encephalomalacia to suggest chronic cortical infarction elsewhere within the brain. No other evidence for acute or chronic intracranial hemorrhage. No mass lesion or midline shift. No hydrocephalus or extra-axial fluid collection. Pituitary gland  suprasellar region normal. Midline structures intact. Vascular: Major intracranial vascular flow voids are maintained. Skull and upper cervical spine: Craniocervical junction within normal limits. Bone marrow signal intensity diffusely heterogeneous. No visible focal marrow replacing lesion. No scalp soft tissue abnormality. Sinuses/Orbits: Prior bilateral ocular lens replacement. Scattered mucosal thickening noted within the  ethmoidal air cells. Paranasal sinuses are otherwise clear. No significant mastoid effusion. Inner ear structures grossly normal. Other: None. MRA HEAD FINDINGS Anterior circulation: Examination mildly degraded by motion. Visualized distal cervical segments of the internal carotid arteries are patent with antegrade flow. Petrous segments patent bilaterally. Scattered atheromatous irregularity throughout the carotid siphons without visible high-grade stenosis. There is question of a small 3 mm aneurysm projecting medially from the cavernous left ICA on corresponding MRA of the neck (series 17, image 13), not entirely certain given motion, and not definitely seen on the MRA head portion of this study. A1 segments patent bilaterally. Normal anterior communicating artery complex. Multifocal severe bilateral A2 stenoses are seen (series 5, images 115 and 142 on the right) (series 5, image 136 on the left). ACAs remain patent to their distal aspects. Atheromatous irregularity throughout the M1 segments without high-grade stenosis. Normal MCA bifurcations. Diffuse small vessel atheromatous change seen throughout the distal MCA branches bilaterally without proximal branch occlusion. Posterior circulation: Both vertebral arteries patent to the vertebrobasilar junction without stenosis. Right vertebral artery dominant. Both PICA origins patent and normal. Basilar mildly irregular but patent to its distal aspect without stenosis. Superior cerebral arteries patent bilaterally. Both PCAs primarily supplied  via the basilar, although a small right posterior communicating artery is noted. Atheromatous irregularity within the PCAs without high-grade stenosis. Anatomic variants: None significant. MRA NECK FINDINGS Aortic arch: Examination degraded by motion. Visualized aortic arch normal caliber with normal branch pattern. No significant stenosis seen about the origin the great vessels. Visualized subclavian arteries patent. Right carotid system: Right CCA patent from its origin to the bifurcation without stenosis. No significant atheromatous stenosis about the right carotid bulb or proximal right ICA. Right ICA somewhat irregular and tortuous but is widely patent to the skull base without stenosis, evidence for dissection, or occlusion. Left carotid system: Left CCA patent from its origin to the bifurcation without stenosis. No more than mild atheromatous irregularity about the left carotid bulb without significant stenosis. Left ICA regular and tortuous but widely patent distally to the skull base without stenosis, evidence for dissection or occlusion. Vertebral arteries: Both vertebral arteries arise from the subclavian arteries. No proximal subclavian artery stenosis. Right vertebral artery dominant. Vertebral arteries tortuous but widely patent within the neck without stenosis, evidence for dissection or occlusion. Other: None IMPRESSION: MRI HEAD: 1. Patchy small volume acute to early subacute ischemic infarcts involving the high left frontal and parietal lobes as well as the right parietal lobe as above. No associated hemorrhage or mass effect. 2. Underlying mild chronic microvascular ischemic disease. MRA HEAD: 1. Negative intracranial MRA for large vessel occlusion. 2. Multifocal severe bilateral A2 stenoses. 3. Moderate diffuse small vessel atheromatous change throughout the intracranial circulation. No other proximal high-grade or correctable stenosis. 4. Question small 3 mm aneurysm projecting medially from the  cavernous left ICA, not entirely certain given motion. Attention at follow-up recommended. MRA NECK: 1. Negative MRA of the neck. No hemodynamically significant or critical flow limiting stenosis identified. 2. Diffuse tortuosity of the major arterial vasculature of the neck, suggesting chronic underlying hypertension. Electronically Signed   By: Jeannine Boga M.D.   On: 02/09/2021 00:38   DG Pelvis Portable  Result Date: 02/08/2021 CLINICAL DATA:  Fall EXAM: PORTABLE PELVIS 1-2 VIEWS COMPARISON:  None. FINDINGS: No fracture or dislocation is seen. Bilateral hip joint spaces are preserved. Visualized bony pelvis appears intact. Degenerative changes of the lower lumbar spine. IMPRESSION: Negative. Electronically Signed   By: Bertis Ruddy  [place] Pink M.D.   On: 02/08/2021 20:27   DG Chest Port 1 View  Result Date: 02/08/2021 CLINICAL DATA:  Fall EXAM: PORTABLE CHEST 1 VIEW COMPARISON:  10/22/2020 FINDINGS: Lungs are clear.  No pleural effusion or pneumothorax. The heart is top-normal in size.  Thoracic aortic atherosclerosis. Surgical clips along the right lateral chest wall. IMPRESSION: No evidence of acute cardiopulmonary disease. Electronically Signed   By: Julian Hy M.D.   On: 02/08/2021 20:27    EKG: Personally reviewed.  Rhythm is normal sinus rhythm with heart rate of 93 bpm.  Notable PACs.  No dynamic ST segment changes appreciated.  Assessment/Plan Principal Problem:   Acute stroke due to ischemia [facility])  Patient presenting with what seems to be an expressive aphasia with last known well the afternoon of 8/10. CT imaging and MRI imaging consistent with acute ischemic stroke with MRI in particular revealing patchy small volume acute early subacute ischemic infarct involving the high left frontal and parietal lobes as well as the right parietal lobe. Diffuse distribution of strokes concerning for possible cardioembolic source. Patient is already been given 325 mg of aspirin by the  emergency department staff, we will follow this up with 81 mg daily starting 8/12. We will additionally initiate Plavix, patient will likely require dual antiplatelet therapy for  21 days Will continue home regimen of statin therapy with dosing to be adjusted based on results of lipid panel Serial neurologic checks Permissive hypertension Monitoring patient on telemetry PT, OT, SLP evaluation Echocardiogram in the morning Dr. Curly Shores with neurology has been contacted by the emergency department staff who is planning to consult, her input is appreciated.  Active Problems:   Acute renal failure superimposed on stage 3a chronic kidney disease (HCC)  Notable acute kidney injury superimposed on chronic kidney disease with creatinine currently at 2.7 up from a baseline of approximately 1.05 Etiology is not clear, possibly prerenal due to poor oral intake over the past 24 hours Hydrating patient gently with intravenous isotonic fluids Strict input and output monitoring Empirically holding home regimen of lisinopril Monitoring renal function and electrolytes with serial chemistries  Asymptomatic bacteriuria  While patient is presenting with an expressive aphasia this is likely not attributable to patient's slightly abnormal urinalysis Urinalysis is not particularly compelling for an infection despite very mild leukocytosis Urine culture obtained If patient begins to clinically worsen or exhibits fevers with worsening leukocytosis will reconsider initiation of antibiotics.    Essential hypertension  Permissive hypertension As needed intravenous antihypertensives for markedly elevated blood pressure of greater than [id number] systolic    GERD without esophagitis  Protonix daily    Type 2 diabetes mellitus with stage 3a chronic kidney disease, without long-term current use of insulin (HCC)  Accu-Cheks before every meal and nightly with sliding scale insulin Hemoglobin A1c pending    Elevated  creatine kinase  Slight elevated creatine kinase of 1200 While patient is additionally suffering from mild acute kidney injury considering how low the creatine kinase level is I do not necessarily think patient is currently suffering from rhabdomyolysis at this point Regardless, hydrating patient gently with intravenous isotonic fluids Will obtain serial creatine kinase level in the morning    Elevated troponin level not due myocardial infarction  Patient is currently chest pain-free Troponins are 25 and 29 with flat trajectory of elevation making plaque rupture unlikely Monitoring patient on telemetry EKG reveals no evidence of dynamic ST segment changes    Mixed diabetic hyperlipidemia associated with type 2 diabetes mellitus (  [place])  Continue home regimen of statin therapy as mentioned above, will titrate dose if LDL is above 70   Code Status:  Full code -while patient does have a documented DNR in the past, patient is unable to answer whether she wishes to be DNR on this presentation.  Furthermore this was unclear per my discussion with the daughter. Family Communication: Plan of care discussed at length with the daughter via phone conversation  Status is: Inpatient  Remains inpatient appropriate because:Ongoing diagnostic testing needed not appropriate for outpatient work up, IV treatments appropriate due to intensity of illness or inability to take PO, and Inpatient level of care appropriate due to severity of illness  Dispo: The patient is from: Home              Anticipated d/c is to: SNF              Patient currently is not medically stable to d/c.   Difficult to place patient No        Vernelle Emerald MD [facility] Pager [phone]  If 7PM-7AM, please contact night-coverage [email or web address] Use universal [place] password for that web site. If you do not have the password, please call the hospital operator.  02/09/2021, 4:01 AM

## 2021-02-09 NOTE — Consult Note (Signed)
Neurology Consultation Reason for Consult: Concern for acute stroke Requesting Physician: Shirlyn Goltz  CC: Confusion  History is obtained from: Patient, chart review and ED provider  HPI: Lindsay Martinez is a 87 y.o. female with a past medical history significant for diabetes, stage III CKD, hypertension, hyperlipidemia, gastric banding (2011), memory impairment.  Patient lives independently but family checks on her frequently, with a son calling the patient daily at 3 PM.  She was last known well at 3 PM on 8/10, and when her son called on 8/11 she sounded confused.  Therefore her daughter who lives nearby came to check on the patient after she was done with her work and found that the patient was very altered.  Her speech has waxed and waned dramatically during her time in the ED, at times she answers only yes/no while at other times her speech is quite fluent and nearly normal.  She apparently had a similar episode 2 weeks ago which resolved spontaneously and therefore they did not seek medical attention at the time.  She was most recently admitted on 4/24-28/2022 for altered mental status and found to have an E. coli UTI as well as possible right middle lobe pneumonia. She was treated with antibiotics and improved to baseline prior to discharge.  She also presented on 7/15 with mechanical fall and head CT was negative at that time  LKW: 3 PM on 8/10 tPA given?: No, due to out of the window and hypodensity is already seen on head CT Premorbid modified rankin scale: 0-1     0 - No symptoms.     1 - No significant disability. Able to carry out all usual activities, despite some symptoms.     2 - Slight disability. Able to look after own affairs without assistance, but unable to carry out all previous activities.     3 - Moderate disability. Requires some help, but able to walk unassisted.     4 - Moderately severe disability. Unable to attend to own bodily needs without assistance, and unable to  walk unassisted.     5 - Severe disability. Requires constant nursing care and attention, bedridden, incontinent.     6 - Dead.   ROS: Unable to obtain reliably due to altered mental status.   Past Medical History:  Diagnosis Date   Essential hypertension 10/22/2020   GERD without esophagitis 10/22/2020   Memory impairment    Mixed diabetic hyperlipidemia associated with type 2 diabetes mellitus ([place]) 02/09/2021   Sepsis due to urinary tract infection ([place]) 10/22/2020   Type 2 diabetes mellitus with stage 3a chronic kidney disease, without long-term current use of insulin ([place]) 10/22/2020   Past Surgical History:  Procedure Laterality Date   LAPAROSCOPIC GASTRIC BANDING  2011    Current Facility-Administered Medications:     stroke: mapping our early stages of recovery book, , Does not apply, Once, Shalhoub, Sherryll Burger, MD   acetaminophen (TYLENOL) tablet 650 mg, 650 mg, Oral, Q4H PRN **OR** acetaminophen (TYLENOL) 160 MG/5ML solution 650 mg, 650 mg, Per Tube, Q4H PRN **OR** acetaminophen (TYLENOL) suppository 650 mg, 650 mg, Rectal, Q4H PRN, Shalhoub, Sherryll Burger, MD   aspirin EC tablet 81 mg, 81 mg, Oral, Daily, Shalhoub, Sherryll Burger, MD   atorvastatin (LIPITOR) tablet 10 mg, 10 mg, Oral, Daily, Shalhoub, Sherryll Burger, MD   clopidogrel (PLAVIX) tablet 75 mg, 75 mg, Oral, Daily, Shalhoub, Sherryll Burger, MD   enoxaparin (LOVENOX) injection 30 mg, 30 mg, Subcutaneous, Q24H, Shalhoub, Iona Beard  J, MD   insulin aspart (novoLOG) injection 0-15 Units, 0-15 Units, Subcutaneous, TID AC & HS, Shalhoub, Sherryll Burger, MD   lactated ringers infusion, , Intravenous, Continuous, Shalhoub, Sherryll Burger, MD, Last Rate: 100 mL/hr at 02/09/21 0354, New Bag at 02/09/21 0354   ondansetron (ZOFRAN) injection 4 mg, 4 mg, Intravenous, Q6H PRN, Shalhoub, Sherryll Burger, MD   oxybutynin (DITROPAN) tablet 5 mg, 5 mg, Oral, Daily, Shalhoub, Sherryll Burger, MD   pantoprazole (PROTONIX) EC tablet 40 mg, 40 mg, Oral, Daily, Shalhoub, Sherryll Burger, MD    PARoxetine (PAXIL) tablet 20 mg, 20 mg, Oral, Daily, Shalhoub, Sherryll Burger, MD   polyethylene glycol (MIRALAX / GLYCOLAX) packet 17 g, 17 g, Oral, Daily PRN, Shalhoub, Sherryll Burger, MD   traZODone (DESYREL) tablet 100 mg, 100 mg, Oral, QHS PRN, Shalhoub, Sherryll Burger, MD  Current Outpatient Medications:    atorvastatin (LIPITOR) 10 MG tablet, Take 10 mg by mouth daily., Disp: , Rfl:    lisinopril (ZESTRIL) 20 MG tablet, Take 20 mg by mouth daily., Disp: , Rfl:    oxybutynin (DITROPAN) 5 MG tablet, Take 5 mg by mouth daily., Disp: , Rfl:    pantoprazole (PROTONIX) 40 MG tablet, Take 40 mg by mouth daily., Disp: , Rfl:    PARoxetine (PAXIL) 20 MG tablet, Take 20 mg by mouth daily., Disp: , Rfl:    traZODone (DESYREL) 100 MG tablet, Take 100 mg by mouth at bedtime as needed for sleep., Disp: , Rfl:    Family History  Family history unknown: Yes     Social History:  reports that she has quit smoking. She has never used smokeless tobacco. She reports current alcohol use. No history on file for drug use.  Exam: Current vital signs: BP 127/65   Pulse 72   Temp 97.7 F (36.5 C) (Oral)   Resp 19   SpO2 93%  Vital signs in last 24 hours: Temp:  [97.3 F (36.3 C)-97.7 F (36.5 C)] 97.7 F (36.5 C) (08/11 2346) Pulse Rate:  [51-102] 72 (08/12 0400) Resp:  [16-27] 19 (08/12 0400) BP: (98-141)/(57-93) 127/65 (08/12 0400) SpO2:  [93 %-100 %] 93 % (08/12 0400)   Physical Exam  Constitutional: Appears well-developed and well-nourished.  Psych: Affect inappropriate with frequent laughter Eyes: No scleral injection HENT: No oropharyngeal obstruction.  MSK: no joint deformities.  Cardiovascular: Normal rate and regular rhythm.  Respiratory: Effort normal, non-labored breathing GI: Soft.  No distension. There is no tenderness.  Skin: Warm dry and intact visible skin  Neuro: Mental Status: Patient is awake, alert, oriented to person, place, month, and year intermittently but cannot give much  details about situation or history Intermittently has severe aphasia and possible mild right-sided neglect Cranial Nerves: II: Visual Fields are full. Pupils are mildly anisocoric but both round and reactive to light, left pupil is slightly larger than the right pupil by about 1 mm.   III,IV, VI: EOMI without ptosis or diploplia.  V: Facial sensation is symmetric to temperature VII: Facial movement is symmetric.  VIII: hearing is intact to voice X: Uvula elevates symmetrically XI: Shoulder shrug is symmetric. XII: tongue is midline without atrophy or fasciculations.  Motor: Tone is normal. Bulk is normal. 5/5 strength was present in the left upper and lower extremities.  On the right upper extremity, she has diffuse mild weakness, 4/5 in the deltoids, triceps and biceps as well as grip strength.  On the right lower extremity she has mild hip flexion weakness (4), knee extension (  4+), knee flexion (4) Sensory: Sensation is symmetric to light touch and temperature in the arms and legs. Deep Tendon Reflexes: 2+ and symmetric in the biceps and patellae.  Plantars: Toes are downgoing bilaterally.  Cerebellar: FNF and HKS are intact bilaterally  NIHSS total 2 Score breakdown: One-point for intermittently getting her age wrong, one-point for mild to moderate aphasia    I have reviewed labs in epic and the results pertinent to this consultation are: Significant AKI with creatinine from baseline of 1.05 now 2.71 on admission today, GFR baseline of 51 but 16 today CK elevation at 1211 Mild leukocytosis at 12.2 from baseline of 9.2 and stable mild anemia 10.7 (normocytic with elevated RDW) UA positive for leukocytes with many bacteria, potentially concerning for UTI  Lab Results  Component Value Date   HGBA1C 6.7 (H) 10/23/2020   No results found for: CHOL, HDL, LDLCALC, LDLDIRECT, TRIG, CHOLHDL  I have personally reviewed the images obtained:  HCT with hypodensities in the L MCA  territory concerning for acute to subacute stroke  MRI HEAD: Agree with radiology read as below, notes that the patchy cortical involvement in multiple vascular territories is highly concerning for central embolic source 1. Patchy small volume acute to early subacute ischemic infarcts involving the high left frontal and parietal lobes as well as the right parietal lobe as above. No associated hemorrhage or mass effect. 2. Underlying mild chronic microvascular ischemic disease.   MRA HEAD: 1. Negative intracranial MRA for large vessel occlusion. 2. Multifocal severe bilateral A2 stenoses. 3. Moderate diffuse small vessel atheromatous change throughout the intracranial circulation. No other proximal high-grade or correctable stenosis. 4. 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 Attention at follow-up recommended.   MRA NECK: 1. Negative MRA of the neck. No hemodynamically significant or critical flow limiting stenosis identified. 2. Diffuse tortuosity of the major arterial vasculature of the neck, suggesting chronic underlying hypertension.    Impression: This is an 87 year old woman with past medical history as detailed above presenting with acute onset confusion found to have waxing and waning symptoms.  Examination is notable for intermittent language difficulties and some mild right-sided weakness in the upper and lower extremity.  Interestingly she had some similar symptoms last week concerning for TIA at that time; given that she still have ADC changes, I do not think her stroke was so long ago.  Also the patchy cortical involvement and from her multiple vascular territories is highly concerning for central embolic source though hypotensive etiology could also be considered.  Given the considerable waxing and waning she has had in the ED I am concerned about the potential of seizures especially given the very cortical nature  of her strokes. Alternatively this may be secondary to her dementia with delirium overlying especially in the setting of her metabolic derangements such as AKI, will obtain an EEG to clarify.  Recommendations: #Multifocal watershed distribution strokes left cerebrum greater than right - Stroke labs TSH, ESR, HgbA1c, fasting lipid panel, UDS - Frequent neuro checks - Echocardiogram, may need to consider TEE EEG if negative - Prophylactic therapy-Antiplatelet med: Aspirin - dose 357m PO or 3043mPR, followed by 81 mg daily - Plavix 300 mg load with 75 mg daily for 21 day course  - Risk factor modification - Telemetry monitoring; 30 day event monitor on discharge if no arrythmias captured  - Blood pressure goal  - Permissive hypertension to 220/120  - PT consult,  OT consult, Speech consult - Routine EEG - Stroke team to follow  [place] [phone] Available 7 PM to 7 AM, outside of these hours please call Neurologist on call as listed on Amion.

## 2021-02-09 NOTE — Progress Notes (Signed)
Echocardiogram 2D Echocardiogram has been performed.  One[place] Deputy Delorus Langwell RDCS 02/09/2021, 9:33 AM

## 2021-02-09 NOTE — ED Notes (Signed)
Pt sitting in recliner chair °

## 2021-02-09 NOTE — Progress Notes (Signed)
EEG complete - results pending 

## 2021-02-10 LAB — HEMOGLOBIN A1C
Hgb A1c MFr Bld: 6.1 % — ABNORMAL HIGH (ref 4.8–5.6)
Mean Plasma Glucose: 128.37 mg/dL

## 2021-02-10 LAB — BASIC METABOLIC PANEL
Anion gap: 6 (ref 5–15)
BUN: 29 mg/dL — ABNORMAL HIGH (ref 8–23)
CO2: 21 mmol/L — ABNORMAL LOW (ref 22–32)
Calcium: 9.5 mg/dL (ref 8.9–10.3)
Chloride: 110 mmol/L (ref 98–111)
Creatinine, Ser: 1.62 mg/dL — ABNORMAL HIGH (ref 0.44–1.00)
GFR, Estimated: 31 mL/min — ABNORMAL LOW (ref >60)
Glucose, Bld: 117 mg/dL — ABNORMAL HIGH (ref 70–99)
Potassium: 4.6 mmol/L (ref 3.5–5.1)
Sodium: 137 mmol/L (ref 135–145)

## 2021-02-10 LAB — LDL CHOLESTEROL, DIRECT: Direct LDL: 64.4 mg/dL (ref 0–99)

## 2021-02-10 LAB — GLUCOSE, CAPILLARY
Glucose-Capillary: 99 mg/dL (ref 70–99)
Glucose-Capillary: 101 mg/dL — ABNORMAL HIGH (ref 70–99)
Glucose-Capillary: 97 mg/dL (ref 70–99)
Glucose-Capillary: 114 mg/dL — ABNORMAL HIGH (ref 70–99)

## 2021-02-10 MED ORDER — SODIUM CHLORIDE 0.9 % IV SOLN: INTRAVENOUS | Status: DC

## 2021-02-10 NOTE — Evaluation (Signed)
Physical Therapy Evaluation Patient Details Name: Lindsay Martinez MRN: [id number] DOB: [birth year] Today's Date: 02/10/2021   History of Present Illness  87 year old female who presents with confusion. 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 No tPA given.  PMH: diabetes mellitus type 2, hypertension, dementia, hyperlipidemia, gastroesophageal reflux disease, chronic kidney disease stage IIIa, obesity.  Clinical Impression  Pt admitted with above diagnosis. Pt lived alone in [place] with daughter checking on her regularly PTA. Currently pt not oriented to place and with decreased awareness of deficits and safety. Pt mobilizing at min A level, ambulating with RW. Pt agreeable to SNF for rehab before returning home.  Pt currently with functional limitations due to the deficits listed below (see PT Problem List). Pt will benefit from skilled PT to increase their independence and safety with mobility to allow discharge to the venue listed below.       Follow Up Recommendations SNF;Supervision/Assistance - 24 hour    Equipment Recommendations  None recommended by PT    Recommendations for Other Services       Precautions / Restrictions Precautions Precautions: Fall Precaution Comments: Incontinent of urine Restrictions Weight Bearing Restrictions: No      Mobility  Bed Mobility Overal bed mobility: Needs Assistance Bed Mobility: Sit to Supine;Supine to Sit     Supine to sit: Min assist Sit to supine: Min assist   General bed mobility comments: needed min A to LE's to get fully to EOB. Had some difficulty scooting back into bed to lie back down and needed min A to LE's to lift against gravity    Transfers Overall transfer level: Needs assistance Equipment used: Rolling walker (2 wheeled) Transfers: Sit to/from Stand Sit to Stand: Min guard         General transfer comment: min guard A for safety  Ambulation/Gait Ambulation/Gait assistance: Min  assist Gait Distance (Feet): 170 Feet Assistive device: Rolling walker (2 wheeled) Gait Pattern/deviations: Step-through pattern;Decreased stride length Gait velocity: decreased Gait velocity interpretation: <1.8 ft/sec, indicate of risk for recurrent falls General Gait Details: pt needed directional cues. No LOB with RW  Stairs            Wheelchair Mobility    Modified Rankin (Stroke Patients Only) Modified Rankin (Stroke Patients Only) Pre-Morbid Rankin Score: No symptoms Modified Rankin: Moderately severe disability     Balance Overall balance assessment: Needs assistance Sitting-balance support: Feet supported Sitting balance-Leahy Scale: Fair     Standing balance support: Bilateral upper extremity supported Standing balance-Leahy Scale: Poor Standing balance comment: reliant on RW and external cues/assist                             Pertinent Vitals/Pain Pain Assessment: No/denies pain    Home Living Family/patient expects to be discharged to:: Private residence Living Arrangements: Alone Available Help at Discharge: Family;Available PRN/intermittently Type of Home: Apartment Home Access: Elevator     Home Layout: One level Home Equipment: Clinical cytogeneticist - 2 wheels;Grab bars - tub/shower Additional Comments: 3rd floor condo with elevator    Prior Function Level of Independence: Independent         Comments: Per patient: Does own ADLs, daughter assists with community mobility, brings meals, and assists with bills.  Does minimal cooking.  Using RW in condo.     Hand Dominance   Dominant Hand: Right    Extremity/Trunk Assessment   Upper Extremity Assessment Upper Extremity Assessment: Defer to OT  evaluation    Lower Extremity Assessment Lower Extremity Assessment: Generalized weakness    Cervical / Trunk Assessment Cervical / Trunk Assessment: Kyphotic  Communication   Communication: Expressive difficulties  Cognition  Arousal/Alertness: Awake/alert Behavior During Therapy: WFL for tasks assessed/performed Overall Cognitive Status: No family/caregiver present to determine baseline cognitive functioning Area of Impairment: Orientation;Memory;Following commands;Safety/judgement;Awareness;Problem solving                 Orientation Level: Disoriented to;Place;Time;Situation   Memory: Decreased short-term memory Following Commands: Follows one step commands with increased time Safety/Judgement: Decreased awareness of safety;Decreased awareness of deficits Awareness: Intellectual Problem Solving: Difficulty sequencing;Requires verbal cues;Requires tactile cues;Slow processing General Comments: pt knows that she is in the hospital but cannot remember which one despite RN reviewing it with her this AM. Followed basic commands. Word finding difficulties notable      General Comments General comments (skin integrity, edema, etc.): VSS    Exercises     Assessment/Plan    PT Assessment Patient needs continued PT services  PT Problem List Decreased strength;Decreased activity tolerance;Decreased balance;Decreased mobility;Decreased coordination;Decreased cognition;Decreased knowledge of use of DME;Decreased knowledge of precautions;Decreased safety awareness       PT Treatment Interventions DME instruction;Gait training;Functional mobility training;Therapeutic activities;Therapeutic exercise;Balance training;Patient/family education;Cognitive remediation    PT Goals (Current goals can be found in the Care Plan section)  Acute Rehab PT Goals Patient Stated Goal: Patient unsure PT Goal Formulation: With patient Time For Goal Achievement: 02/24/21 Potential to Achieve Goals: Good    Frequency Min 3X/week   Barriers to discharge Decreased caregiver support lives alone    Co-evaluation               AM-PAC PT "6 Clicks" Mobility  Outcome Measure Help needed turning from your back to your  side while in a flat bed without using bedrails?: A Little Help needed moving from lying on your back to sitting on the side of a flat bed without using bedrails?: A Little Help needed moving to and from a bed to a chair (including a wheelchair)?: A Little Help needed standing up from a chair using your arms (e.g., wheelchair or bedside chair)?: A Little Help needed to walk in hospital room?: A Little Help needed climbing 3-5 steps with a railing? : A Lot 6 Click Score: 17    End of Session Equipment Utilized During Treatment: Gait belt Activity Tolerance: Patient tolerated treatment well Patient left: in bed;with call bell/phone within reach;with bed alarm set Nurse Communication: Mobility status PT Visit Diagnosis: Unsteadiness on feet (R26.81);Muscle weakness (generalized) (M62.81);Difficulty in walking, not elsewhere classified (R26.2)    Time: [id number] PT Time Calculation (min) (ACUTE ONLY): 25 min   Charges:   PT Evaluation $PT Eval Moderate Complexity: 1 Mod PT Treatments $Gait Training: 8-22 mins        Leighton Roach, PT  Acute Rehab Services  Pager [phone] Office [place] 02/10/2021, 2:09 PM

## 2021-02-10 NOTE — Progress Notes (Signed)
PROGRESS NOTE    Lindsay Martinez  [id number] DOB: [birth year] DOA: 02/08/2021 PCP: Kristen Loader, FNP   Chief Complain:confusion  Brief Narrative: Patient is 87 year old female with history of diabetes type 2, hypertension, dementia, hyperlipidemia, GERD, CKD stage IIIa, obesity who presented to the hospital for the evaluation of confusion.  As per the family, she was suddenly confused, standing naked in the middle of the living room, could not speak.  Stroke was suspected on admission.  CT of the head was suspicious for acute to subacute cortical and subcortical infarcts without hemorrhage.  Neurology consulted.  MRI showed patchy small volume acute/early acute ischemic infarct involving the high left frontal/parietal lobes as well as right parietal lobe.  Distribution was suspicious for possible cardioembolic event.  Patient was started on aspirin, Plavix.  Stroke work-up initiated. On presentation her labs also showed AKI.  She was started on IV fluids. Echo showed EF of [id number], grade 1 diastolic dysfunction, no cardiac source of embolism..  Lipid panel showed LDL of 64, A1c of 6.1.  EEG did not show any seizure activity.  EKG normal sinus rhythm.  Stroke work-up completed.  Neurology recommended aspirin 325 mg and Plavix for 3 months followed by Plavix alone given severe intracranial stenosis.  Also recommended 30-day event monitoring.  PT/OT recommended skilled nursing facility.  Patient is medically stable for discharge as soon as bed is available.  Assessment & Plan:   Principal Problem:   Acute stroke due to ischemia [facility]) Active Problems:   Essential hypertension   GERD without esophagitis   Type 2 diabetes mellitus with stage 3a chronic kidney disease, without long-term current use of insulin (HCC)   Acute renal failure superimposed on stage 3a chronic kidney disease (HCC)   Elevated creatine kinase   Elevated troponin level not due myocardial infarction   Mixed diabetic  hyperlipidemia associated with type 2 diabetes mellitus ([place])   Acute ischemic stroke: Presented with confusion. Neurology consulted.  MRI showed patchy small volume acute/early acute ischemic infarct involving the high left frontal/parietal lobes as well as right parietal lobe.  MRA showed no large vessel occlusion or critical flow-limiting stenosis of head or neck.  Showed severe bilateral A2 stenoses diffuse tortuosity of the major arterial vasculature of neck suggesting chronic underlying hypertension Suspicious for possible cardioembolic event.Echo showed EF of A999333, grade 1 diastolic dysfunction, no cardiac source of embolism..  Lipid panel showed LDL of 64, A1c of 6.1.  EEG did not show any seizure activity.  EKG normal sinus rhythm.  Stroke work-up completed.  Neurology recommended aspirin 325 mg and Plavix for 3 months followed by Plavix alone given severe intracranial stenosis.  Started on Lipitor 10 mg daily.  Also recommended 30-day event monitoring( we will send message to cardiology before DC).  PT/OT recommended skilled nursing facility.  AKI: Creatinine in the range of 2 on presentation.  Baseline creatinine normal.  Improved with IV fluids.  Patient found to be dehydrated on presentation.  CK was also elevated on presentation  Diabetes type 2: Monitor blood sugars.  Continue sliding scale insulin for now.A1c of 6.1  GERD: Continue Protonix.  Hypertension: Allow permissive hypertension.  Continue as needed medications for severe hypertension.  Monitor blood pressure.  History of dementia: High risk for delirium.  Continue delirium precautions.She is not oriented at baseline  Moderate obesity: BMI 36  Debility/deconditioning: PT/OT recommended skilled nursing facilty  on discharge.         DVT prophylaxis:Lovenox Code Status: Full  Family Communication: Called daughter on phone, call not received Status is: Inpatient  Remains inpatient appropriate because:Inpatient  level of care appropriate due to severity of illness  Dispo: The patient is from: SNF              Anticipated d/c is to: SNF              Patient currently is medically stable to d/c.   Difficult to place patient No     Consultants: Neurology  Procedures:None  Antimicrobials:  Anti-infectives (From admission, onward)    Start     Dose/Rate Route Frequency Ordered Stop   02/08/21 2315  cefTRIAXone (ROCEPHIN) 1 g in sodium chloride 0.9 % 100 mL IVPB        1 g 200 mL/hr over 30 Minutes Intravenous  Once 02/08/21 2307 02/09/21 0026       Subjective: Patient seen and examined at bedside this morning.  Hemodynamically stable.  She is much more alert and awake today.  Communicates well.  Not oriented to time but oriented to place.  Denies any complaints.  No obvious focal neurological deficits.  Pleasant  Objective: Vitals:   02/09/21 2045 02/09/21 2048 02/09/21 2254 02/10/21 0316  BP:  (!) 158/80 (!) 176/60 (!) 147/52  Pulse: (!) 54 74 70 66  Resp: '19 16  16  '$ Temp:   97.8 F (36.6 C) 97.8 F (36.6 C)  TempSrc:   Oral Oral  SpO2: 99% 100% 100% 100%  Weight:   78.7 kg     Intake/Output Summary (Last 24 hours) at 02/10/2021 0740 Last data filed at 02/10/2021 0500 Gross per 24 hour  Intake 120 ml  Output 200 ml  Net -80 ml   Filed Weights   02/09/21 2254  Weight: 78.7 kg    Examination:  General exam: Overall comfortable, not in distress,obese, pleasantly confused HEENT: PERRL Respiratory system:  no wheezes or crackles  Cardiovascular system: S1 & S2 heard, RRR.  Gastrointestinal system: Abdomen is nondistended, soft and nontender. Central nervous system: Alert, awake, oriented to place only, pleasantly confused, follows commands, has generalized weakness but no focal neurological deficits Extremities: No edema, no clubbing ,no cyanosis Skin: No rashes, no ulcers,no icterus       Data Reviewed: I have personally reviewed following labs and imaging  studies  CBC: Recent Labs  Lab 02/08/21 2013 02/09/21 0507  WBC 12.2* 7.2  NEUTROABS 9.1* 4.2  HGB 10.7* 9.0*  HCT 34.5* 30.6*  MCV 86.5 91.6  PLT 308 A999333   Basic Metabolic Panel: Recent Labs  Lab 02/08/21 2013 02/09/21 0507 02/10/21 0203  NA 135 137 137  K 4.5 4.2 4.6  CL 106 113* 110  CO2 20* 17* 21*  GLUCOSE 91 89 117*  BUN 49* 37* 29*  CREATININE 2.71* 1.66* 1.62*  CALCIUM 10.1 8.6* 9.5   GFR: Estimated Creatinine Clearance: 21.6 mL/min (A) (by C-G formula based on SCr of 1.62 mg/dL (H)). Liver Function Tests: Recent Labs  Lab 02/08/21 2013 02/09/21 0507  AST 49* 40  ALT 25 18  ALKPHOS 58 42  BILITOT 0.4 0.7  PROT 6.3* 4.6*  ALBUMIN 3.5 2.5*   No results for input(s): LIPASE, AMYLASE in the last 168 hours. No results for input(s): AMMONIA in the last 168 hours. Coagulation Profile: No results for input(s): INR, PROTIME in the last 168 hours. Cardiac Enzymes: Recent Labs  Lab 02/08/21 2013 02/09/21 0507  CKTOTAL 1,211* 869*   BNP (last 3 results) No results  for input(s): PROBNP in the last 8760 hours. HbA1C: Recent Labs    02/10/21 0203  HGBA1C 6.1*   CBG: Recent Labs  Lab 02/09/21 0830 02/09/21 1230 02/09/21 2007 02/09/21 2247 02/10/21 0739  GLUCAP 81 76 93 96 99   Lipid Profile: Recent Labs    02/09/21 0507 02/10/21 0203  CHOL 108  --   HDL 40*  --   LDLCALC NOT CALCULATED  --   TRIG 72  --   CHOLHDL 2.7  --   LDLDIRECT  --  64.4   Thyroid Function Tests: Recent Labs    02/09/21 0507  TSH 0.435   Anemia Panel: No results for input(s): VITAMINB12, FOLATE, FERRITIN, TIBC, IRON, RETICCTPCT in the last 72 hours. Sepsis Labs: Recent Labs  Lab 02/09/21 0507  PROCALCITON <0.10    Recent Results (from the past 240 hour(s))  Resp Panel by RT-PCR (Flu A&B, Covid) Nasopharyngeal Swab     Status: None   Collection Time: 02/08/21  7:46 PM   Specimen: Nasopharyngeal Swab; Nasopharyngeal(NP) swabs in vial transport medium   Result Value Ref Range Status   SARS Coronavirus 2 by RT PCR NEGATIVE NEGATIVE Final    Comment: (NOTE) SARS-CoV-2 target nucleic acids are NOT DETECTED.  The SARS-CoV-2 RNA is generally detectable in upper respiratory specimens during the acute phase of infection. The lowest concentration of SARS-CoV-2 viral copies this assay can detect is 138 copies/mL. A negative result does not preclude SARS-Cov-2 infection and should not be used as the sole basis for treatment or other patient management decisions. A negative result may occur with  improper specimen collection/handling, submission of specimen other than nasopharyngeal swab, presence of viral mutation(s) within the areas targeted by this assay, and inadequate number of viral copies(<138 copies/mL). A negative result must be combined with clinical observations, patient history, and epidemiological information. The expected result is Negative.  Fact Sheet for Patients:  EntrepreneurPulse.com.au  Fact Sheet for Healthcare Providers:  IncredibleEmployment.be  This test is no t yet approved or cleared by the [place] FDA and  has been authorized for detection and/or diagnosis of SARS-CoV-2 by FDA under an Emergency Use Authorization (EUA). This EUA will remain  in effect (meaning this test can be used) for the duration of the COVID-19 declaration under Section 564(b)(1) of the Act, 21 U.S.C.section 360bbb-3(b)(1), unless the authorization is terminated  or revoked sooner.       Influenza A by PCR NEGATIVE NEGATIVE Final   Influenza B by PCR NEGATIVE NEGATIVE Final    Comment: (NOTE) The Xpert Xpress SARS-CoV-2/FLU/RSV plus assay is intended as an aid in the diagnosis of influenza from Nasopharyngeal swab specimens and should not be used as a sole basis for treatment. Nasal washings and aspirates are unacceptable for Xpert Xpress SARS-CoV-2/FLU/RSV testing.  Fact Sheet for  Patients: EntrepreneurPulse.com.au  Fact Sheet for Healthcare Providers: IncredibleEmployment.be  This test is not yet approved or cleared by the [place] FDA and has been authorized for detection and/or diagnosis of SARS-CoV-2 by FDA under an Emergency Use Authorization (EUA). This EUA will remain in effect (meaning this test can be used) for the duration of the COVID-19 declaration under Section 564(b)(1) of the Act, 21 U.S.C. section 360bbb-3(b)(1), unless the authorization is terminated or revoked.  Performed at [facility], [place] [address] [place], [place] [address]   Culture, blood (routine x 2)     Status: None (Preliminary result)   Collection Time: 02/09/21  5:02 AM   Specimen:  BLOOD RIGHT FOREARM  Result Value Ref Range Status   Specimen Description BLOOD RIGHT FOREARM  Final   Special Requests   Final    BOTTLES DRAWN AEROBIC AND ANAEROBIC Blood Culture results may not be optimal due to an inadequate volume of blood received in culture bottles   Culture   Final    NO GROWTH < 12 HOURS Performed at [facility] [address] [place], [place] [address]    Report Status PENDING  Incomplete         Radiology Studies: CT HEAD WO CONTRAST (5MM)  Result Date: 02/08/2021 CLINICAL DATA:  Mental status change confusion EXAM: CT HEAD WITHOUT CONTRAST TECHNIQUE: Contiguous axial images were obtained from the base of the skull through the vertex without intravenous contrast. COMPARISON:  CT brain 01/12/2021 FINDINGS: Brain: No hemorrhage or intracranial mass. New foci of hypodensity involving the cortical and subcortical left frontal lobe at the cranial vertex, series 3, image 6 and sagittal series 5, image 35. Mild atrophy. Mild white matter small vessel ischemic change. Stable ventricle size Vascular: No hyperdense vessels.  Carotid vascular calcification. Skull: Normal. Negative for fracture or focal lesion.  Sinuses/Orbits: No acute finding. Other: None IMPRESSION: 1. Findings suspicious for small acute to subacute cortical and subcortical infarcts at the left cranial vertex. MRI follow-up as indicated. No hemorrhage. Findings appear new since CT from July 2022. Electronically Signed   By: Donavan Foil M.D.   On: 02/08/2021 20:58   MR ANGIO HEAD WO CONTRAST  Result Date: 02/09/2021 CLINICAL DATA:  Initial evaluation for acute stroke. EXAM: MRI HEAD WITHOUT CONTRAST MRA HEAD WITHOUT CONTRAST MRA NECK WITHOUT AND WITH CONTRAST TECHNIQUE: Multiplanar, multi-echo pulse sequences of the brain and surrounding structures were acquired without intravenous contrast. Angiographic images of the Circle of Willis were acquired using MRA technique without intravenous contrast. Angiographic images of the neck were acquired using MRA technique without and with intravenous contrast. Carotid stenosis measurements (when applicable) are obtained utilizing NASCET criteria, using the distal internal carotid diameter as the denominator. CONTRAST:  7.53m GADAVIST GADOBUTROL 1 MMOL/ML IV SOLN COMPARISON:  CT from earlier the same day. FINDINGS: MRI HEAD FINDINGS Brain: Cerebral volume within normal limits for age. Scattered patchy T2/FLAIR hyperintensity within the periventricular deep white matter both cerebral hemispheres most consistent with chronic small vessel ischemic disease, mild for age. Patchy areas of diffusion signal abnormality seen involving the cortical and subcortical aspect of the high left frontal and parietal lobes near the vertex, corresponding with abnormality on prior CT (series 5, image 89). Additional patchy cortical and subcortical diffusion abnormality seen involving the right parietal lobe (series 5, images 85, 82). Associated mild T2/FLAIR signal intensity with some residual ADC signal loss. Findings most consistent with acute to early subacute ischemic infarcts. No associated hemorrhage or mass effect.  Otherwise, gray-white matter differentiation maintained with no other acute or subacute ischemia. No encephalomalacia to suggest chronic cortical infarction elsewhere within the brain. No other evidence for acute or chronic intracranial hemorrhage. No mass lesion or midline shift. No hydrocephalus or extra-axial fluid collection. Pituitary gland suprasellar region normal. Midline structures intact. Vascular: Major intracranial vascular flow voids are maintained. Skull and upper cervical spine: Craniocervical junction within normal limits. Bone marrow signal intensity diffusely heterogeneous. No visible focal marrow replacing lesion. No scalp soft tissue abnormality. Sinuses/Orbits: Prior bilateral ocular lens replacement. Scattered mucosal thickening noted within the ethmoidal air cells. Paranasal sinuses are otherwise clear. No significant mastoid effusion. Inner ear structures  grossly normal. Other: None. MRA HEAD FINDINGS Anterior circulation: Examination mildly degraded by motion. Visualized distal cervical segments of the internal carotid arteries are patent with antegrade flow. Petrous segments patent bilaterally. Scattered atheromatous irregularity throughout the carotid siphons without visible high-grade stenosis. There is question of a small 3 mm aneurysm projecting medially from the cavernous left ICA on corresponding MRA of the neck (series 17, image 13), not entirely certain given motion, and not definitely seen on the MRA head portion of this study. A1 segments patent bilaterally. Normal anterior communicating artery complex. Multifocal severe bilateral A2 stenoses are seen (series 5, images 115 and 142 on the right) (series 5, image 136 on the left). ACAs remain patent to their distal aspects. Atheromatous irregularity throughout the M1 segments without high-grade stenosis. Normal MCA bifurcations. Diffuse small vessel atheromatous change seen throughout the distal MCA branches bilaterally without  proximal branch occlusion. Posterior circulation: Both vertebral arteries patent to the vertebrobasilar junction without stenosis. Right vertebral artery dominant. Both PICA origins patent and normal. Basilar mildly irregular but patent to its distal aspect without stenosis. Superior cerebral arteries patent bilaterally. Both PCAs primarily supplied via the basilar, although a small right posterior communicating artery is noted. Atheromatous irregularity within the PCAs without high-grade stenosis. Anatomic variants: None significant. MRA NECK FINDINGS Aortic arch: Examination degraded by motion. Visualized aortic arch normal caliber with normal branch pattern. No significant stenosis seen about the origin the great vessels. Visualized subclavian arteries patent. Right carotid system: Right CCA patent from its origin to the bifurcation without stenosis. No significant atheromatous stenosis about the right carotid bulb or proximal right ICA. Right ICA somewhat irregular and tortuous but is widely patent to the skull base without stenosis, evidence for dissection, or occlusion. Left carotid system: Left CCA patent from its origin to the bifurcation without stenosis. No more than mild atheromatous irregularity about the left carotid bulb without significant stenosis. Left ICA regular and tortuous but widely patent distally to the skull base without stenosis, evidence for dissection or occlusion. Vertebral arteries: Both vertebral arteries arise from the subclavian arteries. No proximal subclavian artery stenosis. Right vertebral artery dominant. Vertebral arteries tortuous but widely patent within the neck without stenosis, evidence for dissection or occlusion. Other: None IMPRESSION: MRI HEAD: 1. Patchy small volume acute to early subacute ischemic infarcts involving the high left frontal and parietal lobes as well as the right parietal lobe as above. No associated hemorrhage or mass effect. 2. Underlying mild chronic  microvascular ischemic disease. MRA HEAD: 1. Negative intracranial MRA for large vessel occlusion. 2. Multifocal severe bilateral A2 stenoses. 3. Moderate diffuse small vessel atheromatous change throughout the intracranial circulation. No other proximal high-grade or correctable stenosis. 4. 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 Attention at follow-up recommended. MRA NECK: 1. Negative MRA of the neck. No hemodynamically significant or critical flow limiting stenosis identified. 2. Diffuse tortuosity of the major arterial vasculature of the neck, suggesting chronic underlying hypertension. Electronically Signed   By: Jeannine Boga M.D.   On: 02/09/2021 00:38   MR Angiogram Neck W or Wo Contrast  Result Date: 02/09/2021 CLINICAL DATA:  Initial evaluation for acute stroke. EXAM: MRI HEAD WITHOUT CONTRAST MRA HEAD WITHOUT CONTRAST MRA NECK WITHOUT AND WITH CONTRAST TECHNIQUE: Multiplanar, multi-echo pulse sequences of the brain and surrounding structures were acquired without intravenous contrast. Angiographic images of the Circle of Willis were acquired using MRA technique without intravenous contrast. Angiographic images  of the neck were acquired using MRA technique without and with intravenous contrast. Carotid stenosis measurements (when applicable) are obtained utilizing NASCET criteria, using the distal internal carotid diameter as the denominator. CONTRAST:  7.35m GADAVIST GADOBUTROL 1 MMOL/ML IV SOLN COMPARISON:  CT from earlier the same day. FINDINGS: MRI HEAD FINDINGS Brain: Cerebral volume within normal limits for age. Scattered patchy T2/FLAIR hyperintensity within the periventricular deep white matter both cerebral hemispheres most consistent with chronic small vessel ischemic disease, mild for age. Patchy areas of diffusion signal abnormality seen involving the cortical and subcortical aspect of the high left frontal  and parietal lobes near the vertex, corresponding with abnormality on prior CT (series 5, image 89). Additional patchy cortical and subcortical diffusion abnormality seen involving the right parietal lobe (series 5, images 85, 82). Associated mild T2/FLAIR signal intensity with some residual ADC signal loss. Findings most consistent with acute to early subacute ischemic infarcts. No associated hemorrhage or mass effect. Otherwise, gray-white matter differentiation maintained with no other acute or subacute ischemia. No encephalomalacia to suggest chronic cortical infarction elsewhere within the brain. No other evidence for acute or chronic intracranial hemorrhage. No mass lesion or midline shift. No hydrocephalus or extra-axial fluid collection. Pituitary gland suprasellar region normal. Midline structures intact. Vascular: Major intracranial vascular flow voids are maintained. Skull and upper cervical spine: Craniocervical junction within normal limits. Bone marrow signal intensity diffusely heterogeneous. No visible focal marrow replacing lesion. No scalp soft tissue abnormality. Sinuses/Orbits: Prior bilateral ocular lens replacement. Scattered mucosal thickening noted within the ethmoidal air cells. Paranasal sinuses are otherwise clear. No significant mastoid effusion. Inner ear structures grossly normal. Other: None. MRA HEAD FINDINGS Anterior circulation: Examination mildly degraded by motion. Visualized distal cervical segments of the internal carotid arteries are patent with antegrade flow. Petrous segments patent bilaterally. Scattered atheromatous irregularity throughout the carotid siphons without visible high-grade stenosis. There is question of a small 3 mm aneurysm projecting medially from the cavernous left ICA on corresponding MRA of the neck (series 17, image 13), not entirely certain given motion, and not definitely seen on the MRA head portion of this study. A1 segments patent bilaterally. Normal  anterior communicating artery complex. Multifocal severe bilateral A2 stenoses are seen (series 5, images 115 and 142 on the right) (series 5, image 136 on the left). ACAs remain patent to their distal aspects. Atheromatous irregularity throughout the M1 segments without high-grade stenosis. Normal MCA bifurcations. Diffuse small vessel atheromatous change seen throughout the distal MCA branches bilaterally without proximal branch occlusion. Posterior circulation: Both vertebral arteries patent to the vertebrobasilar junction without stenosis. Right vertebral artery dominant. Both PICA origins patent and normal. Basilar mildly irregular but patent to its distal aspect without stenosis. Superior cerebral arteries patent bilaterally. Both PCAs primarily supplied via the basilar, although a small right posterior communicating artery is noted. Atheromatous irregularity within the PCAs without high-grade stenosis. Anatomic variants: None significant. MRA NECK FINDINGS Aortic arch: Examination degraded by motion. Visualized aortic arch normal caliber with normal branch pattern. No significant stenosis seen about the origin the great vessels. Visualized subclavian arteries patent. Right carotid system: Right CCA patent from its origin to the bifurcation without stenosis. No significant atheromatous stenosis about the right carotid bulb or proximal right ICA. Right ICA somewhat irregular and tortuous but is widely patent to the skull base without stenosis, evidence for dissection, or occlusion. Left carotid system: Left CCA patent from its origin to the bifurcation without stenosis. No more than mild atheromatous irregularity about  the left carotid bulb without significant stenosis. Left ICA regular and tortuous but widely patent distally to the skull base without stenosis, evidence for dissection or occlusion. Vertebral arteries: Both vertebral arteries arise from the subclavian arteries. No proximal subclavian artery  stenosis. Right vertebral artery dominant. Vertebral arteries tortuous but widely patent within the neck without stenosis, evidence for dissection or occlusion. Other: None IMPRESSION: MRI HEAD: 1. Patchy small volume acute to early subacute ischemic infarcts involving the high left frontal and parietal lobes as well as the right parietal lobe as above. No associated hemorrhage or mass effect. 2. Underlying mild chronic microvascular ischemic disease. MRA HEAD: 1. Negative intracranial MRA for large vessel occlusion. 2. Multifocal severe bilateral A2 stenoses. 3. Moderate diffuse small vessel atheromatous change throughout the intracranial circulation. No other proximal high-grade or correctable stenosis. 4. 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 Attention at follow-up recommended. MRA NECK: 1. Negative MRA of the neck. No hemodynamically significant or critical flow limiting stenosis identified. 2. Diffuse tortuosity of the major arterial vasculature of the neck, suggesting chronic underlying hypertension. Electronically Signed   By: Jeannine Boga M.D.   On: 02/09/2021 00:38   MR BRAIN WO CONTRAST  Result Date: 02/09/2021 CLINICAL DATA:  Initial evaluation for acute stroke. EXAM: MRI HEAD WITHOUT CONTRAST MRA HEAD WITHOUT CONTRAST MRA NECK WITHOUT AND WITH CONTRAST TECHNIQUE: Multiplanar, multi-echo pulse sequences of the brain and surrounding structures were acquired without intravenous contrast. Angiographic images of the Circle of Willis were acquired using MRA technique without intravenous contrast. Angiographic images of the neck were acquired using MRA technique without and with intravenous contrast. Carotid stenosis measurements (when applicable) are obtained utilizing NASCET criteria, using the distal internal carotid diameter as the denominator. CONTRAST:  7.48m GADAVIST GADOBUTROL 1 MMOL/ML IV SOLN COMPARISON:  CT from  earlier the same day. FINDINGS: MRI HEAD FINDINGS Brain: Cerebral volume within normal limits for age. Scattered patchy T2/FLAIR hyperintensity within the periventricular deep white matter both cerebral hemispheres most consistent with chronic small vessel ischemic disease, mild for age. Patchy areas of diffusion signal abnormality seen involving the cortical and subcortical aspect of the high left frontal and parietal lobes near the vertex, corresponding with abnormality on prior CT (series 5, image 89). Additional patchy cortical and subcortical diffusion abnormality seen involving the right parietal lobe (series 5, images 85, 82). Associated mild T2/FLAIR signal intensity with some residual ADC signal loss. Findings most consistent with acute to early subacute ischemic infarcts. No associated hemorrhage or mass effect. Otherwise, gray-white matter differentiation maintained with no other acute or subacute ischemia. No encephalomalacia to suggest chronic cortical infarction elsewhere within the brain. No other evidence for acute or chronic intracranial hemorrhage. No mass lesion or midline shift. No hydrocephalus or extra-axial fluid collection. Pituitary gland suprasellar region normal. Midline structures intact. Vascular: Major intracranial vascular flow voids are maintained. Skull and upper cervical spine: Craniocervical junction within normal limits. Bone marrow signal intensity diffusely heterogeneous. No visible focal marrow replacing lesion. No scalp soft tissue abnormality. Sinuses/Orbits: Prior bilateral ocular lens replacement. Scattered mucosal thickening noted within the ethmoidal air cells. Paranasal sinuses are otherwise clear. No significant mastoid effusion. Inner ear structures grossly normal. Other: None. MRA HEAD FINDINGS Anterior circulation: Examination mildly degraded by motion. Visualized distal cervical segments of the internal carotid arteries are patent with antegrade flow. Petrous  segments patent bilaterally. Scattered atheromatous irregularity throughout the carotid siphons without visible high-grade stenosis. There  is question of a small 3 mm aneurysm projecting medially from the cavernous left ICA on corresponding MRA of the neck (series 17, image 13), not entirely certain given motion, and not definitely seen on the MRA head portion of this study. A1 segments patent bilaterally. Normal anterior communicating artery complex. Multifocal severe bilateral A2 stenoses are seen (series 5, images 115 and 142 on the right) (series 5, image 136 on the left). ACAs remain patent to their distal aspects. Atheromatous irregularity throughout the M1 segments without high-grade stenosis. Normal MCA bifurcations. Diffuse small vessel atheromatous change seen throughout the distal MCA branches bilaterally without proximal branch occlusion. Posterior circulation: Both vertebral arteries patent to the vertebrobasilar junction without stenosis. Right vertebral artery dominant. Both PICA origins patent and normal. Basilar mildly irregular but patent to its distal aspect without stenosis. Superior cerebral arteries patent bilaterally. Both PCAs primarily supplied via the basilar, although a small right posterior communicating artery is noted. Atheromatous irregularity within the PCAs without high-grade stenosis. Anatomic variants: None significant. MRA NECK FINDINGS Aortic arch: Examination degraded by motion. Visualized aortic arch normal caliber with normal branch pattern. No significant stenosis seen about the origin the great vessels. Visualized subclavian arteries patent. Right carotid system: Right CCA patent from its origin to the bifurcation without stenosis. No significant atheromatous stenosis about the right carotid bulb or proximal right ICA. Right ICA somewhat irregular and tortuous but is widely patent to the skull base without stenosis, evidence for dissection, or occlusion. Left carotid system:  Left CCA patent from its origin to the bifurcation without stenosis. No more than mild atheromatous irregularity about the left carotid bulb without significant stenosis. Left ICA regular and tortuous but widely patent distally to the skull base without stenosis, evidence for dissection or occlusion. Vertebral arteries: Both vertebral arteries arise from the subclavian arteries. No proximal subclavian artery stenosis. Right vertebral artery dominant. Vertebral arteries tortuous but widely patent within the neck without stenosis, evidence for dissection or occlusion. Other: None IMPRESSION: MRI HEAD: 1. Patchy small volume acute to early subacute ischemic infarcts involving the high left frontal and parietal lobes as well as the right parietal lobe as above. No associated hemorrhage or mass effect. 2. Underlying mild chronic microvascular ischemic disease. MRA HEAD: 1. Negative intracranial MRA for large vessel occlusion. 2. Multifocal severe bilateral A2 stenoses. 3. Moderate diffuse small vessel atheromatous change throughout the intracranial circulation. No other proximal high-grade or correctable stenosis. 4. 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 Attention at follow-up recommended. MRA NECK: 1. Negative MRA of the neck. No hemodynamically significant or critical flow limiting stenosis identified. 2. Diffuse tortuosity of the major arterial vasculature of the neck, suggesting chronic underlying hypertension. Electronically Signed   By: Jeannine Boga M.D.   On: 02/09/2021 00:38   DG Pelvis Portable  Result Date: 02/08/2021 CLINICAL DATA:  Fall EXAM: PORTABLE PELVIS 1-2 VIEWS COMPARISON:  None. FINDINGS: No fracture or dislocation is seen. Bilateral hip joint spaces are preserved. Visualized bony pelvis appears intact. Degenerative changes of the lower lumbar spine. IMPRESSION: Negative. Electronically Signed   By: Julian Hy M.D.   On: 02/08/2021 20:27   DG Chest Port 1 View  Result Date: 02/08/2021 CLINICAL DATA:  Fall EXAM: PORTABLE CHEST 1 VIEW COMPARISON:  10/22/2020 FINDINGS: Lungs are clear.  No pleural effusion or pneumothorax. The heart is top-normal in size.  Thoracic aortic atherosclerosis. Surgical clips along the right lateral chest wall.  IMPRESSION: No evidence of acute cardiopulmonary disease. Electronically Signed   By: Julian Hy M.D.   On: 02/08/2021 20:27   EEG adult  Result Date: 02/09/2021 Lora Havens, MD     02/09/2021 10:42 AM Patient Name: KENNADI RECLA MRN: [id number] Epilepsy Attending: Lora Havens Referring Physician/Provider: Dr Lesleigh Noe Date: 02/09/2021 Duration: 21.49 mins Patient history: 87 year old female with acute onset confusion.  EEG to evaluate seizures. Level of alertness: Awake, asleep AEDs during EEG study: None Technical aspects: This EEG study was done with scalp electrodes positioned according to the 10-20 International system of electrode placement. Electrical activity was acquired at a sampling rate of '500Hz'$  and reviewed with a high frequency filter of '70Hz'$  and a low frequency filter of '1Hz'$ . EEG data were recorded continuously and digitally stored. Description: The posterior dominant rhythm consists of 7.5 Hz activity of moderate voltage (25-35 uV) seen predominantly in posterior head regions, symmetric and reactive to eye opening and eye closing. Sleep was characterized by vertex waves, sleep spindles (12 to 14 Hz), maximal frontocentral region.  EEG showed continuous generalized 5 to 6 Hz theta slowing. Physiologic photic driving was not seen during photic stimulation.  Hyperventilation was not performed.   ABNORMALITY - Continuous slow, generalized IMPRESSION: This study is suggestive of mild diffuse encephalopathy, nonspecific etiology. No seizures or epileptiform discharges were seen throughout the recording. Lora Havens   ECHOCARDIOGRAM  COMPLETE  Result Date: 02/09/2021    ECHOCARDIOGRAM REPORT   Patient Name:   MACAYLEE BIRELEY Date of Exam: 02/09/2021 Medical Rec #:  [id number]       Height:       58.0 in Accession #:    [id number]      Weight:       170.0 lb Date of Birth:  08-Apr-1931       BSA:          1.700 m Patient Age:    42 years        BP:           140/55 mmHg Patient Gender: F               HR:           69 bpm. Exam Location:  Inpatient Procedure: 2D Echo, Color Doppler and Cardiac Doppler Indications:    Stroke i63.9  History:        Patient has no prior history of Echocardiogram examinations.                 Risk Factors:Hypertension, Diabetes and Dyslipidemia.  Sonographer:    Raquel Sarna Senior RDCS Referring Phys: [id number] [place]  1. Left ventricular ejection fraction, by estimation, is 70 to 75%. The left ventricle has hyperdynamic function. The left ventricle has no regional wall motion abnormalities. There is mild left ventricular hypertrophy. Left ventricular diastolic parameters are consistent with Grade I diastolic dysfunction (impaired relaxation).  2. Right ventricular systolic function is normal. The right ventricular size is moderately enlarged. There is moderately elevated pulmonary artery systolic pressure. The estimated right ventricular systolic pressure is Q000111Q mmHg.  3. Left atrial size was mild to moderately dilated.  4. The mitral valve is normal in structure. Mild mitral valve regurgitation. No evidence of mitral stenosis.  5. The aortic valve is normal in structure. Aortic valve regurgitation is not visualized. No aortic stenosis is present.  6. The inferior vena cava is normal in size with greater than 50% respiratory variability,  suggesting right atrial pressure of 3 mmHg. Conclusion(s)/Recommendation(s): No intracardiac source of embolism detected on this transthoracic study. A transesophageal echocardiogram is recommended to exclude cardiac source of embolism if clinically indicated. FINDINGS   Left Ventricle: Left ventricular ejection fraction, by estimation, is 70 to 75%. The left ventricle has hyperdynamic function. The left ventricle has no regional wall motion abnormalities. The left ventricular internal cavity size was normal in size. There is mild left ventricular hypertrophy. Left ventricular diastolic parameters are consistent with Grade I diastolic dysfunction (impaired relaxation). Right Ventricle: The right ventricular size is moderately enlarged. No increase in right ventricular wall thickness. Right ventricular systolic function is normal. There is moderately elevated pulmonary artery systolic pressure. The tricuspid regurgitant  velocity is 3.41 m/s, and with an assumed right atrial pressure of 3 mmHg, the estimated right ventricular systolic pressure is Q000111Q mmHg. Left Atrium: Left atrial size was mild to moderately dilated. Right Atrium: Right atrial size was normal in size. Pericardium: There is no evidence of pericardial effusion. Mitral Valve: The mitral valve is normal in structure. Mild mitral valve regurgitation. No evidence of mitral valve stenosis. Tricuspid Valve: The tricuspid valve is normal in structure. Tricuspid valve regurgitation is mild . No evidence of tricuspid stenosis. Aortic Valve: The aortic valve is normal in structure. Aortic valve regurgitation is not visualized. No aortic stenosis is present. Pulmonic Valve: The pulmonic valve was normal in structure. Pulmonic valve regurgitation is not visualized. No evidence of pulmonic stenosis. Aorta: The aortic root is normal in size and structure. Venous: The inferior vena cava is normal in size with greater than 50% respiratory variability, suggesting right atrial pressure of 3 mmHg. IAS/Shunts: No atrial level shunt detected by color flow Doppler.  LEFT VENTRICLE PLAX 2D LVIDd:         3.70 cm LVIDs:         2.30 cm LV PW:         1.20 cm LV IVS:        1.00 cm LVOT diam:     1.90 cm LV SV:         71 LV SV Index:   42  LVOT Area:     2.84 cm  RIGHT VENTRICLE RV Basal diam:  4.60 cm RV S prime:     11.40 cm/s TAPSE (M-mode): 2.0 cm LEFT ATRIUM              Index       RIGHT ATRIUM           Index LA diam:        4.30 cm  2.53 cm/m  RA Area:     18.80 cm LA Vol (A2C):   44.4 ml  26.12 ml/m RA Volume:   50.50 ml  29.71 ml/m LA Vol (A4C):   104.0 ml 61.19 ml/m LA Biplane Vol: 73.1 ml  43.01 ml/m  AORTIC VALVE LVOT Vmax:   118.00 cm/s LVOT Vmean:  71.700 cm/s LVOT VTI:    0.249 m  AORTA Ao Root diam: 2.50 cm Ao Asc diam:  2.20 cm TRICUSPID VALVE TR Peak grad:   46.5 mmHg TR Vmax:        341.00 cm/s  SHUNTS Systemic VTI:  0.25 m Systemic Diam: 1.90 cm Candee Furbish MD Electronically signed by Candee Furbish MD Signature Date/Time: 02/09/2021/11:56:18 AM    Final         Scheduled Meds:   stroke: mapping our early stages of recovery book  Does not apply Once   aspirin EC  325 mg Oral Daily   atorvastatin  10 mg Oral Daily   clopidogrel  75 mg Oral Daily   enoxaparin (LOVENOX) injection  30 mg Subcutaneous Q24H   insulin aspart  0-15 Units Subcutaneous TID AC & HS   oxybutynin  5 mg Oral Daily   pantoprazole  40 mg Oral Daily   PARoxetine  20 mg Oral Daily   Continuous Infusions:   LOS: 2 days    Time spent: 25 mins.More than 50% of that time was spent in counseling and/or coordination of care.      Shelly Coss, MD [facility] P8/13/2022, 7:40 AM

## 2021-02-11 LAB — URINE CULTURE: Culture: 80000 — AB

## 2021-02-11 LAB — BASIC METABOLIC PANEL
Anion gap: 6 (ref 5–15)
BUN: 23 mg/dL (ref 8–23)
CO2: 19 mmol/L — ABNORMAL LOW (ref 22–32)
Calcium: 9.3 mg/dL (ref 8.9–10.3)
Chloride: 111 mmol/L (ref 98–111)
Creatinine, Ser: 1.3 mg/dL — ABNORMAL HIGH (ref 0.44–1.00)
GFR, Estimated: 40 mL/min — ABNORMAL LOW (ref >60)
Glucose, Bld: 110 mg/dL — ABNORMAL HIGH (ref 70–99)
Potassium: 5.4 mmol/L — ABNORMAL HIGH (ref 3.5–5.1)
Sodium: 136 mmol/L (ref 135–145)

## 2021-02-11 LAB — GLUCOSE, CAPILLARY
Glucose-Capillary: 156 mg/dL — ABNORMAL HIGH (ref 70–99)
Glucose-Capillary: 99 mg/dL (ref 70–99)
Glucose-Capillary: 115 mg/dL — ABNORMAL HIGH (ref 70–99)
Glucose-Capillary: 119 mg/dL — ABNORMAL HIGH (ref 70–99)

## 2021-02-11 MED ORDER — POLYVINYL ALCOHOL 1.4 % OP SOLN
1.0000 [drp] | OPHTHALMIC | Status: DC | PRN
  Administered 2021-02-11 (×2): 1 [drp] via OPHTHALMIC
  Filled 2021-02-11: qty 15

## 2021-02-11 MED ORDER — SODIUM ZIRCONIUM CYCLOSILICATE 10 G PO PACK
10.0000 g | PACK | Freq: Once | ORAL | Status: AC
  Administered 2021-02-11: 10 g via ORAL
  Filled 2021-02-11: qty 1

## 2021-02-11 MED ORDER — AMLODIPINE BESYLATE 5 MG PO TABS
5.0000 mg | ORAL_TABLET | Freq: Every day | ORAL | Status: DC
  Administered 2021-02-11: 5 mg via ORAL
  Filled 2021-02-11: qty 1

## 2021-02-11 NOTE — NC FL2 (Signed)
[place] LEVEL OF CARE SCREENING TOOL     IDENTIFICATION  Patient Name: Lindsay Martinez Birthdate: [birth year] Sex: female Admission Date (Current Location): 02/08/2021  [facility] and [place] Number:  Herbalist and Address:  [facility], [place] [address], [place], [place] [address]      Provider Number: [id number]  Attending Physician Name and Address:  Shelly Coss, MD  Relative Name and Phone Number:  [name] [phone]    Current Level of Care: Hospital Recommended Level of Care: [place] Prior Approval Number:    Date Approved/Denied:   PASRR Number: [id number]  Discharge Plan: SNF    Current Diagnoses: Patient Active Problem List   Diagnosis Date Noted   Acute renal failure superimposed on stage 3a chronic kidney disease ([place]) 02/09/2021   Elevated creatine kinase 02/09/2021   Elevated troponin level not due myocardial infarction 02/09/2021   Mixed diabetic hyperlipidemia associated with type 2 diabetes mellitus ([place]) 02/09/2021   Acute stroke due to ischemia ([place]) 02/08/2021   Overactive bladder 10/22/2020   Essential hypertension 10/22/2020   HLD (hyperlipidemia) 10/22/2020   Depression 10/22/2020   GERD without esophagitis 10/22/2020   Type 2 diabetes mellitus with stage 3a chronic kidney disease, without long-term current use of insulin ([place]) 10/22/2020    Orientation RESPIRATION BLADDER Height & Weight     Self, Time, Situation, Place  Normal Incontinent, External catheter Weight: 173 lb 8 oz (78.7 kg) Height:  '4\' 10"'$  (147.3 cm)  BEHAVIORAL SYMPTOMS/MOOD NEUROLOGICAL BOWEL NUTRITION STATUS      Continent Diet (See DC summary)  AMBULATORY STATUS COMMUNICATION OF NEEDS Skin   Limited Assist Verbally Normal                       Personal Care Assistance Level of Assistance  Bathing, Feeding, Dressing Bathing Assistance: Limited assistance Feeding assistance:  Independent Dressing Assistance: Limited assistance     Functional Limitations Info  Sight, Hearing, Speech Sight Info: Adequate Hearing Info: Adequate Speech Info: Adequate    SPECIAL CARE FACTORS FREQUENCY  PT (By licensed PT), OT (By licensed OT)     PT Frequency: 5x week OT Frequency: 5x week            Contractures Contractures Info: Not present    Additional Factors Info  Code Status, Psychotropic, Allergies, Insulin Sliding Scale Code Status Info: Full Allergies Info: NOF Psychotropic Info: Paroxetine Insulin Sliding Scale Info: Insulin Aspart (Novolog) 0-15 U 3x daily w/ meals       Current Medications (02/11/2021):  This is the current hospital active medication list Current Facility-Administered Medications  Medication Dose Route Frequency Provider Last Rate Last Admin    stroke: mapping our early stages of recovery book   Does not apply Once Shalhoub, Sherryll Burger, MD       acetaminophen (TYLENOL) tablet 650 mg  650 mg Oral Q4H PRN Shalhoub, Sherryll Burger, MD       Or   acetaminophen (TYLENOL) 160 MG/5ML solution 650 mg  650 mg Per Tube Q4H PRN Shalhoub, Sherryll Burger, MD       Or   acetaminophen (TYLENOL) suppository 650 mg  650 mg Rectal Q4H PRN Shalhoub, Sherryll Burger, MD       amLODipine (NORVASC) tablet 5 mg  5 mg Oral Daily Shelly Coss, MD       aspirin EC tablet 325 mg  325 mg Oral Daily Rosalin Hawking, MD  325 mg at 02/10/21 0824   atorvastatin (LIPITOR) tablet 10 mg  10 mg Oral Daily Shalhoub, Sherryll Burger, MD   10 mg at 02/10/21 0824   clopidogrel (PLAVIX) tablet 75 mg  75 mg Oral Daily Bhagat, Srishti L, MD   75 mg at 02/10/21 0824   enoxaparin (LOVENOX) injection 30 mg  30 mg Subcutaneous Q24H Vernelle Emerald, MD   30 mg at 02/10/21 [id number]   hydrALAZINE (APRESOLINE) injection 10 mg  10 mg Intravenous Q6H PRN Shalhoub, Sherryll Burger, MD       insulin aspart (novoLOG) injection 0-15 Units  0-15 Units Subcutaneous TID AC & HS Shalhoub, Sherryll Burger, MD       ondansetron [facility])  injection 4 mg  4 mg Intravenous Q6H PRN Shalhoub, Sherryll Burger, MD       oxybutynin (DITROPAN) tablet 5 mg  5 mg Oral Daily Shalhoub, Sherryll Burger, MD   5 mg at 02/10/21 0824   pantoprazole (PROTONIX) EC tablet 40 mg  40 mg Oral Daily Vernelle Emerald, MD   40 mg at 02/10/21 [id number]   PARoxetine (PAXIL) tablet 20 mg  20 mg Oral Daily Shalhoub, Sherryll Burger, MD   20 mg at 02/10/21 0829   polyethylene glycol (MIRALAX / GLYCOLAX) packet 17 g  17 g Oral Daily PRN Vernelle Emerald, MD       traZODone (DESYREL) tablet 100 mg  100 mg Oral QHS PRN Shalhoub, Sherryll Burger, MD         Discharge Medications: Please see discharge summary for a list of discharge medications.  Relevant Imaging Results:  Relevant Lab Results:   Additional Information SS# [place], [place]

## 2021-02-11 NOTE — Progress Notes (Signed)
PROGRESS NOTE    Lindsay Martinez  [id number] DOB: 11/05/1930 DOA: 02/08/2021 PCP: Kristen Loader, FNP   Chief Complain:confusion  Brief Narrative: Patient is 87 year old female with history of diabetes type 2, hypertension, dementia, hyperlipidemia, GERD, CKD stage IIIa, obesity who presented to the hospital for the evaluation of confusion.  As per the family, she was suddenly confused, standing naked in the middle of the living room, could not speak.  Stroke was suspected on admission.  CT of the head was suspicious for acute to subacute cortical and subcortical infarcts without hemorrhage.  Neurology consulted.  MRI showed patchy small volume acute/early acute ischemic infarct involving the high left frontal/parietal lobes as well as right parietal lobe.  Distribution was suspicious for possible cardioembolic event.  Patient was started on aspirin, Plavix.  Stroke work-up initiated. On presentation her labs also showed AKI.  She was started on IV fluids. Echo showed EF of A999333, grade 1 diastolic dysfunction, no cardiac source of embolism..  Lipid panel showed LDL of 64, A1c of 6.1.  EEG did not show any seizure activity.  EKG normal sinus rhythm.  Stroke work-up completed.  Neurology recommended aspirin 325 mg and Plavix for 3 months followed by Plavix alone given severe intracranial stenosis.  Also recommended 30-day event monitoring.  PT/OT recommended skilled nursing facility.  Patient is medically stable for discharge as soon as bed is available.  Assessment & Plan:   Principal Problem:   Acute stroke due to ischemia [facility]) Active Problems:   Essential hypertension   GERD without esophagitis   Type 2 diabetes mellitus with stage 3a chronic kidney disease, without long-term current use of insulin (HCC)   Acute renal failure superimposed on stage 3a chronic kidney disease (HCC)   Elevated creatine kinase   Elevated troponin level not due myocardial infarction   Mixed diabetic  hyperlipidemia associated with type 2 diabetes mellitus ([place])   Acute ischemic stroke: Presented with confusion. Neurology consulted.  MRI showed patchy small volume acute/early acute ischemic infarct involving the high left frontal/parietal lobes as well as right parietal lobe.  MRA showed no large vessel occlusion or critical flow-limiting stenosis of head or neck.  Showed severe bilateral A2 stenoses diffuse tortuosity of the major arterial vasculature of neck suggesting chronic underlying hypertension Suspicious for possible cardioembolic event.Echo showed EF of A999333, grade 1 diastolic dysfunction, no cardiac source of embolism..  Lipid panel showed LDL of 64, A1c of 6.1.  EEG did not show any seizure activity.  EKG normal sinus rhythm.  Stroke work-up completed.  Neurology recommended aspirin 325 mg and Plavix for 3 months followed by Plavix alone given severe intracranial stenosis.  Started on Lipitor 10 mg daily.  Also recommended 30-day event monitoring( we will send message to cardiology before DC).  PT/OT recommended skilled nursing facility.  AKI: Creatinine in the range of 2 on presentation.  Baseline creatinine normal.  Improved with IV fluids.  Patient found to be dehydrated on presentation.  CK was also elevated on presentation  Diabetes type 2: Monitor blood sugars.  Continue sliding scale insulin for now.A1c of 6.1  GERD: Continue Protonix.  Hypertension: Takes lisinopril at home which has been stopped due to AKI.  Started on amlodipine 5 mg daily.  Monitor blood pressure  History of dementia: High risk for delirium.  Continue delirium precautions.She is not oriented at baseline  Moderate obesity: BMI 36  Debility/deconditioning: PT/OT recommended skilled nursing facilty  on discharge.  DVT prophylaxis:Lovenox Code Status: Full Family Communication: Called both son/ daughter on phone, call snot received Status is: Inpatient  Remains inpatient appropriate  because:Inpatient level of care appropriate due to severity of illness  Dispo: The patient is from: SNF              Anticipated d/c is to: SNF              Patient currently is medically stable to d/c.   Difficult to place patient No     Consultants: Neurology  Procedures:None  Antimicrobials:  Anti-infectives (From admission, onward)    Start     Dose/Rate Route Frequency Ordered Stop   02/08/21 2315  cefTRIAXone (ROCEPHIN) 1 g in sodium chloride 0.9 % 100 mL IVPB        1 g 200 mL/hr over 30 Minutes Intravenous  Once 02/08/21 2307 02/09/21 0026       Subjective: Patient seen and examined  at the bedside this morning.  She is alert, awake and mostly oriented.  Denies any complaints.  No weakness or slurred speech.  Objective: Vitals:   02/11/21 0010 02/11/21 0013 02/11/21 0404 02/11/21 0410  BP: (!) 200/78 (!) 184/98  (!) 119/91  Pulse: 79 71 65 76  Resp: '16 15 16 20  '$ Temp: 97.7 F (36.5 C)   98.4 F (36.9 C)  TempSrc: Oral     SpO2: 99% 97% 96% 97%  Weight:      Height:        Intake/Output Summary (Last 24 hours) at 02/11/2021 0705 Last data filed at 02/11/2021 [id number] Gross per 24 hour  Intake 2145.99 ml  Output 1300 ml  Net 845.99 ml   Filed Weights   02/09/21 2254  Weight: 78.7 kg    Examination:  General exam: Overall comfortable, not in distress,pleasant elderly female HEENT: PERRL Respiratory system:  no wheezes or crackles  Cardiovascular system: S1 & S2 heard, RRR.  Gastrointestinal system: Abdomen is nondistended, soft and nontender. Central nervous system: Alert and oriented Extremities: No edema, no clubbing ,no cyanosis Skin: No rashes, no ulcers,no icterus      Data Reviewed: I have personally reviewed following labs and imaging studies  CBC: Recent Labs  Lab 02/08/21 2013 02/09/21 0507  WBC 12.2* 7.2  NEUTROABS 9.1* 4.2  HGB 10.7* 9.0*  HCT 34.5* 30.6*  MCV 86.5 91.6  PLT 308 A999333   Basic Metabolic Panel: Recent Labs  Lab  02/08/21 2013 02/09/21 0507 02/10/21 0203 02/11/21 0245  NA 135 137 137 136  K 4.5 4.2 4.6 5.4*  CL 106 113* 110 111  CO2 20* 17* 21* 19*  GLUCOSE 91 89 117* 110*  BUN 49* 37* 29* 23  CREATININE 2.71* 1.66* 1.62* 1.30*  CALCIUM 10.1 8.6* 9.5 9.3   GFR: Estimated Creatinine Clearance: 27 mL/min (A) (by C-G formula based on SCr of 1.3 mg/dL (H)). Liver Function Tests: Recent Labs  Lab 02/08/21 2013 02/09/21 0507  AST 49* 40  ALT 25 18  ALKPHOS 58 42  BILITOT 0.4 0.7  PROT 6.3* 4.6*  ALBUMIN 3.5 2.5*   No results for input(s): LIPASE, AMYLASE in the last 168 hours. No results for input(s): AMMONIA in the last 168 hours. Coagulation Profile: No results for input(s): INR, PROTIME in the last 168 hours. Cardiac Enzymes: Recent Labs  Lab 02/08/21 2013 02/09/21 0507  CKTOTAL 1,211* 869*   BNP (last 3 results) No results for input(s): PROBNP in the last 8760 hours. HbA1C: Recent Labs  02/10/21 0203  HGBA1C 6.1*   CBG: Recent Labs  Lab 02/09/21 2247 02/10/21 0739 02/10/21 1126 02/10/21 1529 02/10/21 2147  GLUCAP 96 99 101* 97 114*   Lipid Profile: Recent Labs    02/09/21 0507 02/10/21 0203  CHOL 108  --   HDL 40*  --   LDLCALC NOT CALCULATED  --   TRIG 72  --   CHOLHDL 2.7  --   LDLDIRECT  --  64.4   Thyroid Function Tests: Recent Labs    02/09/21 0507  TSH 0.435   Anemia Panel: No results for input(s): VITAMINB12, FOLATE, FERRITIN, TIBC, IRON, RETICCTPCT in the last 72 hours. Sepsis Labs: Recent Labs  Lab 02/09/21 0507  PROCALCITON <0.10    Recent Results (from the past 240 hour(s))  Urine Culture     Status: Abnormal (Preliminary result)   Collection Time: 02/08/21  7:44 PM   Specimen: Urine, Clean Catch  Result Value Ref Range Status   Specimen Description URINE, CLEAN CATCH  Final   Special Requests   Final    NONE Performed at [facility], [place] [address] [place], [place] [address]    Culture 80,000 COLONIES/mL GRAM  NEGATIVE RODS (A)  Final   Report Status PENDING  Incomplete  Resp Panel by RT-PCR (Flu A&B, Covid) Nasopharyngeal Swab     Status: None   Collection Time: 02/08/21  7:46 PM   Specimen: Nasopharyngeal Swab; Nasopharyngeal(NP) swabs in vial transport medium  Result Value Ref Range Status   SARS Coronavirus 2 by RT PCR NEGATIVE NEGATIVE Final    Comment: (NOTE) SARS-CoV-2 target nucleic acids are NOT DETECTED.  The SARS-CoV-2 RNA is generally detectable in upper respiratory specimens during the acute phase of infection. The lowest concentration of SARS-CoV-2 viral copies this assay can detect is 138 copies/mL. A negative result does not preclude SARS-Cov-2 infection and should not be used as the sole basis for treatment or other patient management decisions. A negative result may occur with  improper specimen collection/handling, submission of specimen other than nasopharyngeal swab, presence of viral mutation(s) within the areas targeted by this assay, and inadequate number of viral copies(<138 copies/mL). A negative result must be combined with clinical observations, patient history, and epidemiological information. The expected result is Negative.  Fact Sheet for Patients:  EntrepreneurPulse.com.au  Fact Sheet for Healthcare Providers:  IncredibleEmployment.be  This test is no t yet approved or cleared by the [place] FDA and  has been authorized for detection and/or diagnosis of SARS-CoV-2 by FDA under an Emergency Use Authorization (EUA). This EUA will remain  in effect (meaning this test can be used) for the duration of the COVID-19 declaration under Section 564(b)(1) of the Act, 21 U.S.C.section 360bbb-3(b)(1), unless the authorization is terminated  or revoked sooner.       Influenza A by PCR NEGATIVE NEGATIVE Final   Influenza B by PCR NEGATIVE NEGATIVE Final    Comment: (NOTE) The Xpert Xpress SARS-CoV-2/FLU/RSV plus assay is  intended as an aid in the diagnosis of influenza from Nasopharyngeal swab specimens and should not be used as a sole basis for treatment. Nasal washings and aspirates are unacceptable for Xpert Xpress SARS-CoV-2/FLU/RSV testing.  Fact Sheet for Patients: EntrepreneurPulse.com.au  Fact Sheet for Healthcare Providers: IncredibleEmployment.be  This test is not yet approved or cleared by the [place] FDA and has been authorized for detection and/or diagnosis of SARS-CoV-2 by FDA under an Emergency Use Authorization (EUA). This EUA will remain in effect (meaning  this test can be used) for the duration of the COVID-19 declaration under Section 564(b)(1) of the Act, 21 U.S.C. section 360bbb-3(b)(1), unless the authorization is terminated or revoked.  Performed at [facility], [place] [address], [place], [place] [address]   Culture, blood (routine x 2)     Status: None (Preliminary result)   Collection Time: Mar 05, 2021  5:02 AM   Specimen: BLOOD RIGHT FOREARM  Result Value Ref Range Status   Specimen Description BLOOD RIGHT FOREARM  Final   Special Requests   Final    BOTTLES DRAWN AEROBIC AND ANAEROBIC Blood Culture results may not be optimal due to an inadequate volume of blood received in culture bottles   Culture   Final    NO GROWTH 1 DAY Performed at [facility], [place] [address], [place], [place] [address]    Report Status PENDING  Incomplete         Radiology Studies: EEG adult  Result Date: 05-Mar-2021 Lora Havens, MD     03/05/2021 10:42 AM Patient Name: Lindsay Martinez MRN: [id number] Epilepsy Attending: Lora Havens Referring Physician/Provider: Dr Lesleigh Noe Date: 2021-03-05 Duration: 21.49 mins Patient history: 87 year old female with acute onset confusion.  EEG to evaluate seizures. Level of alertness: Awake, asleep AEDs during EEG study: None Technical aspects: This EEG study was done with scalp electrodes  positioned according to the 10-20 International system of electrode placement. Electrical activity was acquired at a sampling rate of '500Hz'$  and reviewed with a high frequency filter of '70Hz'$  and a low frequency filter of '1Hz'$ . EEG data were recorded continuously and digitally stored. Description: The posterior dominant rhythm consists of 7.5 Hz activity of moderate voltage (25-35 uV) seen predominantly in posterior head regions, symmetric and reactive to eye opening and eye closing. Sleep was characterized by vertex waves, sleep spindles (12 to 14 Hz), maximal frontocentral region.  EEG showed continuous generalized 5 to 6 Hz theta slowing. Physiologic photic driving was not seen during photic stimulation.  Hyperventilation was not performed.   ABNORMALITY - Continuous slow, generalized IMPRESSION: This study is suggestive of mild diffuse encephalopathy, nonspecific etiology. No seizures or epileptiform discharges were seen throughout the recording. Lora Havens   ECHOCARDIOGRAM COMPLETE  Result Date: 03/05/2021    ECHOCARDIOGRAM REPORT   Patient Name:   Lindsay Martinez Date of Exam: 2021-03-05 Medical Rec #:  [id number]       Height:       58.0 in Accession #:    [id number]      Weight:       170.0 lb Date of Birth:  07/25/1930       BSA:          1.700 m Patient Age:    67 years        BP:           140/55 mmHg Patient Gender: F               HR:           69 bpm. Exam Location:  Inpatient Procedure: 2D Echo, Color Doppler and Cardiac Doppler Indications:    Stroke i63.9  History:        Patient has no prior history of Echocardiogram examinations.                 Risk Factors:Hypertension, Diabetes and Dyslipidemia.  Sonographer:    Raquel Sarna Senior RDCS Referring Phys: [id number] [place]  1. Left ventricular ejection fraction, by estimation, is 70 to 75%. The left ventricle has hyperdynamic function. The left ventricle has no regional wall motion abnormalities. There is mild left ventricular  hypertrophy. Left ventricular diastolic parameters are consistent with Grade I diastolic dysfunction (impaired relaxation).  2. Right ventricular systolic function is normal. The right ventricular size is moderately enlarged. There is moderately elevated pulmonary artery systolic pressure. The estimated right ventricular systolic pressure is Q000111Q mmHg.  3. Left atrial size was mild to moderately dilated.  4. The mitral valve is normal in structure. Mild mitral valve regurgitation. No evidence of mitral stenosis.  5. The aortic valve is normal in structure. Aortic valve regurgitation is not visualized. No aortic stenosis is present.  6. The inferior vena cava is normal in size with greater than 50% respiratory variability, suggesting right atrial pressure of 3 mmHg. Conclusion(s)/Recommendation(s): No intracardiac source of embolism detected on this transthoracic study. A transesophageal echocardiogram is recommended to exclude cardiac source of embolism if clinically indicated. FINDINGS  Left Ventricle: Left ventricular ejection fraction, by estimation, is 70 to 75%. The left ventricle has hyperdynamic function. The left ventricle has no regional wall motion abnormalities. The left ventricular internal cavity size was normal in size. There is mild left ventricular hypertrophy. Left ventricular diastolic parameters are consistent with Grade I diastolic dysfunction (impaired relaxation). Right Ventricle: The right ventricular size is moderately enlarged. No increase in right ventricular wall thickness. Right ventricular systolic function is normal. There is moderately elevated pulmonary artery systolic pressure. The tricuspid regurgitant  velocity is 3.41 m/s, and with an assumed right atrial pressure of 3 mmHg, the estimated right ventricular systolic pressure is Q000111Q mmHg. Left Atrium: Left atrial size was mild to moderately dilated. Right Atrium: Right atrial size was normal in size. Pericardium: There is no  evidence of pericardial effusion. Mitral Valve: The mitral valve is normal in structure. Mild mitral valve regurgitation. No evidence of mitral valve stenosis. Tricuspid Valve: The tricuspid valve is normal in structure. Tricuspid valve regurgitation is mild . No evidence of tricuspid stenosis. Aortic Valve: The aortic valve is normal in structure. Aortic valve regurgitation is not visualized. No aortic stenosis is present. Pulmonic Valve: The pulmonic valve was normal in structure. Pulmonic valve regurgitation is not visualized. No evidence of pulmonic stenosis. Aorta: The aortic root is normal in size and structure. Venous: The inferior vena cava is normal in size with greater than 50% respiratory variability, suggesting right atrial pressure of 3 mmHg. IAS/Shunts: No atrial level shunt detected by color flow Doppler.  LEFT VENTRICLE PLAX 2D LVIDd:         3.70 cm LVIDs:         2.30 cm LV PW:         1.20 cm LV IVS:        1.00 cm LVOT diam:     1.90 cm LV SV:         71 LV SV Index:   42 LVOT Area:     2.84 cm  RIGHT VENTRICLE RV Basal diam:  4.60 cm RV S prime:     11.40 cm/s TAPSE (M-mode): 2.0 cm LEFT ATRIUM              Index       RIGHT ATRIUM           Index LA diam:        4.30 cm  2.53 cm/m  RA Area:     18.80  cm LA Vol (A2C):   44.4 ml  26.12 ml/m RA Volume:   50.50 ml  29.71 ml/m LA Vol (A4C):   104.0 ml 61.19 ml/m LA Biplane Vol: 73.1 ml  43.01 ml/m  AORTIC VALVE LVOT Vmax:   118.00 cm/s LVOT Vmean:  71.700 cm/s LVOT VTI:    0.249 m  AORTA Ao Root diam: 2.50 cm Ao Asc diam:  2.20 cm TRICUSPID VALVE TR Peak grad:   46.5 mmHg TR Vmax:        341.00 cm/s  SHUNTS Systemic VTI:  0.25 m Systemic Diam: 1.90 cm Candee Furbish MD Electronically signed by Candee Furbish MD Signature Date/Time: 02/09/2021/11:56:18 AM    Final         Scheduled Meds:   stroke: mapping our early stages of recovery book   Does not apply Once   aspirin EC  325 mg Oral Daily   atorvastatin  10 mg Oral Daily   clopidogrel   75 mg Oral Daily   enoxaparin (LOVENOX) injection  30 mg Subcutaneous Q24H   insulin aspart  0-15 Units Subcutaneous TID AC & HS   oxybutynin  5 mg Oral Daily   pantoprazole  40 mg Oral Daily   PARoxetine  20 mg Oral Daily   sodium zirconium cyclosilicate  10 g Oral Once   Continuous Infusions:   LOS: 3 days    Time spent: 25 mins.More than 50% of that time was spent in counseling and/or coordination of care.      Shelly Coss, MD [facility] P8/14/2022, 7:05 AM

## 2021-02-11 NOTE — TOC Initial Note (Addendum)
Transition of Care [facility]) - Initial/Assessment Note    Patient Details  Name: Lindsay Martinez MRN: [id number] Date of Birth: [birth year]  [facility]) CM/SW Contact:    Coralee Pesa, [place] Phone Number: 02/11/2021, 11:14 AM  Clinical Narrative:                 CSW noted that pt struggles with memory loss and early dementia, CSW reached out to her daughter for preliminary discussion on transitioning to the next level of care. CSW discussed options with dtr Lindsay Martinez. CSW cited pt's ability to walk 170 ft and be min assist, as concerns for insurance denying SNF. CSW asked about home w/ HH, dtr would like to try for SNF first. Lindsay Martinez advised CSW that pt has had many falls in the last few months and was losing some of her abilities to care for herself. Her hope is that pt can go to SNF short term to get stronger and then be well enough to go to an independent living or assisted living.  She noted that if insurance won't authorize SNF, then she will make plans to care for her mother, many options were discussed.   Lindsay Martinez noted no preference for SNF, except for it to be in the [place] area. Pt has had 2 moderna's and one booster, dtr would like for pt to get another here at the hospital, CSW will alert nurse. CSW and daughter will meet at pt's bedside this afternoon to have a full conversation to get pt's input. TOC will continue to follow.  3:30 CSW and dtr Lindsay Martinez spoke with pt at bedside who is also agreeable to SNF stating she wants to get stronger. Dtr notes that pt is currently not at her baseline for mentation and is unable to carry on conversations. She would like Speech therapy to follow her at rehab. Dtr asked if we could get another PT eval before sending the request for insurance, because walking that far is definitely not something she can generally do.  Dtr would like [facility], [facility] or [facility]. TOC will continue to follow for DC needs.  Expected Discharge Plan:  Skilled Nursing Facility Barriers to Discharge: Insurance Authorization, SNF Pending bed offer   Patient Goals and CMS Choice   CMS Medicare.gov Compare Post Acute Care list provided to:: Patient Represenative (must comment) (Daughter) Choice offered to / list presented to : Adult Children, Patient  Expected Discharge Plan and Services Expected Discharge Plan: [name] Choice: [place] arrangements for the past 2 months: Apartment                                      Prior Living Arrangements/Services Living arrangements for the past 2 months: Apartment Lives with:: Self Patient language and need for interpreter reviewed:: Yes Do you feel safe going back to the place where you live?: Yes      Need for Family Participation in Patient Care: Yes (Comment) Care giver support system in place?: Yes (comment)   Criminal Activity/Legal Involvement Pertinent to Current Situation/Hospitalization: No - Comment as needed  Activities of Daily Living   ADL Screening (condition at time of admission) Patient's cognitive ability adequate to safely complete daily activities?: No Is the patient deaf or have difficulty hearing?: No Does the patient have difficulty seeing, even when wearing glasses/contacts?: No Does the patient  have difficulty concentrating, remembering, or making decisions?: Yes Patient able to express need for assistance with ADLs?: Yes Does the patient have difficulty dressing or bathing?: Yes Independently performs ADLs?: No Does the patient have difficulty walking or climbing stairs?: Yes Weakness of Legs: Both Weakness of Arms/Hands: Both  Permission Sought/Granted Permission sought to share information with : Family Supports Permission granted to share information with : Yes, Verbal Permission Granted  Share Information with NAME: Lindsay Martinez     Permission granted to share info w  Relationship: Daughter  Permission granted to share info w Contact Information: [id number]  Emotional Assessment Appearance:: Appears stated age Attitude/Demeanor/Rapport: Lethargic Affect (typically observed): Appropriate Orientation: : Oriented to Self, Oriented to Place, Oriented to  Time, Oriented to Situation Alcohol / Substance Use: Not Applicable Psych Involvement: No (comment)  Admission diagnosis:  Confusion [R41.0] AKI (acute kidney injury) ([place]) [N17.9] Acute stroke due to ischemia [facility]) [I63.9] Patient Active Problem List   Diagnosis Date Noted   Acute renal failure superimposed on stage 3a chronic kidney disease ([place]) 02/09/2021   Elevated creatine kinase 02/09/2021   Elevated troponin level not due myocardial infarction 02/09/2021   Mixed diabetic hyperlipidemia associated with type 2 diabetes mellitus ([place]) 02/09/2021   Acute stroke due to ischemia ([place]) 02/08/2021   Overactive bladder 10/22/2020   Essential hypertension 10/22/2020   HLD (hyperlipidemia) 10/22/2020   Depression 10/22/2020   GERD without esophagitis 10/22/2020   Type 2 diabetes mellitus with stage 3a chronic kidney disease, without long-term current use of insulin ([place]) 10/22/2020   PCP:  Kristen Loader, FNP Pharmacy:   [facility], [place] [place] [place] [address] Phone: [phone] Fax: [phone]     Social Determinants of Health (SDOH) Interventions    Readmission Risk Interventions Readmission Risk Prevention Plan 10/25/2020  Post Dischage Appt Complete  Medication Screening Complete  Transportation Screening Complete

## 2021-02-12 LAB — BLOOD CULTURE ID PANEL (REFLEXED) - BCID2

## 2021-02-12 LAB — BASIC METABOLIC PANEL
Anion gap: 7 (ref 5–15)
BUN: 20 mg/dL (ref 8–23)
CO2: 23 mmol/L (ref 22–32)
Calcium: 9.7 mg/dL (ref 8.9–10.3)
Chloride: 109 mmol/L (ref 98–111)
Creatinine, Ser: 1.26 mg/dL — ABNORMAL HIGH (ref 0.44–1.00)
GFR, Estimated: 41 mL/min — ABNORMAL LOW (ref >60)
Glucose, Bld: 109 mg/dL — ABNORMAL HIGH (ref 70–99)
Potassium: 3.9 mmol/L (ref 3.5–5.1)
Sodium: 139 mmol/L (ref 135–145)

## 2021-02-12 LAB — GLUCOSE, CAPILLARY
Glucose-Capillary: 104 mg/dL — ABNORMAL HIGH (ref 70–99)
Glucose-Capillary: 99 mg/dL (ref 70–99)
Glucose-Capillary: 110 mg/dL — ABNORMAL HIGH (ref 70–99)
Glucose-Capillary: 118 mg/dL — ABNORMAL HIGH (ref 70–99)

## 2021-02-12 MED ORDER — AMLODIPINE BESYLATE 5 MG PO TABS
5.0000 mg | ORAL_TABLET | ORAL | Status: AC
  Administered 2021-02-12: 5 mg via ORAL
  Filled 2021-02-12: qty 1

## 2021-02-12 MED ORDER — ASPIRIN 325 MG PO TBEC: 325.0000 mg | DELAYED_RELEASE_TABLET | Freq: Every day | ORAL | 0 refills | Status: DC

## 2021-02-12 MED ORDER — ATORVASTATIN CALCIUM 40 MG PO TABS: 40.0000 mg | ORAL_TABLET | Freq: Every day | ORAL | Status: AC

## 2021-02-12 MED ORDER — POLYVINYL ALCOHOL 1.4 % OP SOLN: 1.0000 [drp] | OPHTHALMIC | 0 refills | Status: AC | PRN

## 2021-02-12 MED ORDER — AMLODIPINE BESYLATE 10 MG PO TABS
10.0000 mg | ORAL_TABLET | Freq: Every day | ORAL | Status: DC
  Administered 2021-02-13: 10 mg via ORAL
  Filled 2021-02-12: qty 1

## 2021-02-12 MED ORDER — HYDRALAZINE HCL 25 MG PO TABS: 25.0000 mg | ORAL_TABLET | Freq: Three times a day (TID) | ORAL | Status: AC

## 2021-02-12 MED ORDER — HYDRALAZINE HCL 20 MG/ML IJ SOLN
5.0000 mg | Freq: Four times a day (QID) | INTRAMUSCULAR | Status: DC | PRN
  Administered 2021-02-12: 5 mg via INTRAVENOUS
  Filled 2021-02-12: qty 1

## 2021-02-12 MED ORDER — HYDRALAZINE HCL 25 MG PO TABS
25.0000 mg | ORAL_TABLET | Freq: Three times a day (TID) | ORAL | Status: DC
  Administered 2021-02-12 – 2021-02-13 (×5): 25 mg via ORAL
  Filled 2021-02-12 (×5): qty 1

## 2021-02-12 MED ORDER — POLYETHYLENE GLYCOL 3350 17 G PO PACK: 17.0000 g | PACK | Freq: Every day | ORAL | 0 refills | Status: AC | PRN

## 2021-02-12 MED ORDER — AMLODIPINE BESYLATE 10 MG PO TABS: 10.0000 mg | ORAL_TABLET | Freq: Every day | ORAL | Status: AC

## 2021-02-12 MED ORDER — CLOPIDOGREL BISULFATE 75 MG PO TABS: 75.0000 mg | ORAL_TABLET | Freq: Every day | ORAL | Status: AC

## 2021-02-12 NOTE — Discharge Summary (Addendum)
Physician Discharge Summary  Lindsay Martinez [id number] DOB: February 10, 1931 DOA: 02/08/2021  PCP: Kristen Loader, FNP  Admit date: 02/08/2021 Discharge date: 02/13/21  Admitted From: Home Disposition:  SNF  Discharge Condition:Stable CODE STATUS:FULL Diet recommendation: Heart Healthy   Brief/Interim Summary:  Patient is 87 year old female with history of diabetes type 2, hypertension, dementia, hyperlipidemia, GERD, CKD stage IIIa, obesity who presented to the hospital for the evaluation of confusion.  As per the family, she was suddenly confused, standing naked in the middle of the living room, could not speak.  Stroke was suspected on admission.  CT of the head was suspicious for acute to subacute cortical and subcortical infarcts without hemorrhage.  Neurology consulted.  MRI showed patchy small volume acute/early acute ischemic infarct involving the high left frontal/parietal lobes as well as right parietal lobe.  Distribution was suspicious for possible cardioembolic event.  Patient was started on aspirin, Plavix.  Stroke work-up initiated. On presentation her labs also showed AKI.  She was started on IV fluids. Echo showed EF of [id number], grade 1 diastolic dysfunction, no cardiac source of embolism..  Lipid panel showed LDL of 64, A1c of 6.1.  EEG did not show any seizure activity.  EKG normal sinus rhythm.  Stroke work-up completed.  Neurology recommended aspirin 325 mg and Plavix for 3 months followed by Plavix alone given severe intracranial stenosis.  Also recommended 30-day event monitoring.  PT/OT recommended skilled nursing facility.  Patient is medically stable for discharge as soon as bed is available.  Following problems were addressed during her hospitalization:  Acute ischemic stroke: Presented with confusion. Neurology consulted.  MRI showed patchy small volume acute/early acute ischemic infarct involving the high left frontal/parietal lobes as well as right parietal lobe.  MRA  showed no large vessel occlusion or critical flow-limiting stenosis of head or neck.  Showed severe bilateral A2 stenoses diffuse tortuosity of the major arterial vasculature of neck suggesting chronic underlying hypertension Suspicious for possible cardioembolic event.Echo showed EF of [id number], grade 1 diastolic dysfunction, no cardiac source of embolism..  Lipid panel showed LDL of 64, A1c of 6.1.  EEG did not show any seizure activity.  EKG normal sinus rhythm.  Stroke work-up completed.  Neurology recommended aspirin 325 mg and Plavix for 3 months followed by Plavix alone given severe intracranial stenosis.  Started on Lipitor 10 mg daily.  Also recommended 30-day event monitoring( sent message to cardiology before DC).  PT/OT recommended skilled nursing facility.   AKI: Creatinine in the range of 2 on presentation.  Baseline creatinine normal.  Improved with IV fluids.  Patient found to be dehydrated on presentation.  CK was also elevated on presentation   Diabetes type 2: Continue monitoring blood sugar.  Currently diet controlled   GERD: Continue Protonix.   Hypertension: Continue current medications  History of dementia: Currently alert and awake, obeys commands.  Pleasantly confused   Moderate obesity: BMI 36   Debility/deconditioning: PT/OT recommended skilled nursing facilty  on discharge.       Discharge Diagnoses:  Principal Problem:   Acute stroke due to ischemia [facility]) Active Problems:   Essential hypertension   GERD without esophagitis   Type 2 diabetes mellitus with stage 3a chronic kidney disease, without long-term current use of insulin (HCC)   Acute renal failure superimposed on stage 3a chronic kidney disease (HCC)   Elevated creatine kinase   Elevated troponin level not due myocardial infarction   Mixed diabetic hyperlipidemia associated with type 2 diabetes mellitus (  [facility])    Discharge Instructions  Discharge Instructions     Ambulatory referral to Neurology    Complete by: As directed    Follow up with stroke clinic NP (Jessica Vanschaick or Cecille Rubin, if both not available, consider Zachery Dauer, or Ahern) at [facility] in about 4 weeks. Thanks.   Diet - low sodium heart healthy   Complete by: As directed    Discharge instructions   Complete by: As directed    1)Take prescribed medications as instructed 2)Follow up with [facility] neurology in 4 weeks.  Name and number of the provider group has been attached.   Increase activity slowly   Complete by: As directed       Allergies as of 02/13/2021   Not on File      Medication List     STOP taking these medications    lisinopril 20 MG tablet Commonly known as: ZESTRIL       TAKE these medications    amLODipine 10 MG tablet Commonly known as: NORVASC Take 1 tablet (10 mg total) by mouth daily.   aspirin 325 MG EC tablet Take 1 tablet (325 mg total) by mouth daily. Stop after 90 days   atorvastatin 40 MG tablet Commonly known as: LIPITOR Take 1 tablet (40 mg total) by mouth daily. What changed:  medication strength how much to take   clopidogrel 75 MG tablet Commonly known as: PLAVIX Take 1 tablet (75 mg total) by mouth daily. Continue indefinately   hydrALAZINE 25 MG tablet Commonly known as: APRESOLINE Take 1 tablet (25 mg total) by mouth every 8 (eight) hours.   oxybutynin 5 MG tablet Commonly known as: DITROPAN Take 5 mg by mouth daily.   pantoprazole 40 MG tablet Commonly known as: PROTONIX Take 40 mg by mouth daily.   PARoxetine 20 MG tablet Commonly known as: PAXIL Take 20 mg by mouth daily.   polyethylene glycol 17 g packet Commonly known as: MIRALAX / GLYCOLAX Take 17 g by mouth daily as needed for mild constipation.   polyvinyl alcohol 1.4 % ophthalmic solution Commonly known as: LIQUIFILM TEARS Place 1 drop into both eyes as needed for dry eyes.   traZODone 100 MG tablet Commonly known as: DESYREL Take 100 mg by mouth at bedtime as needed  for sleep.        Follow-up Information     [facility]. Schedule an appointment as soon as possible for a visit in 1 month(s).   Specialty: Neurology Why: stroke clinic Contact information: [address] [place] [phone]               Not on File  Consultations: Neurology   Procedures/Studies: CT HEAD WO CONTRAST (5MM)  Result Date: 02/08/2021 CLINICAL DATA:  Mental status change confusion EXAM: CT HEAD WITHOUT CONTRAST TECHNIQUE: Contiguous axial images were obtained from the base of the skull through the vertex without intravenous contrast. COMPARISON:  CT brain 01/12/2021 FINDINGS: Brain: No hemorrhage or intracranial mass. New foci of hypodensity involving the cortical and subcortical left frontal lobe at the cranial vertex, series 3, image 6 and sagittal series 5, image 35. Mild atrophy. Mild white matter small vessel ischemic change. Stable ventricle size Vascular: No hyperdense vessels.  Carotid vascular calcification. Skull: Normal. Negative for fracture or focal lesion. Sinuses/Orbits: No acute finding. Other: None IMPRESSION: 1. Findings suspicious for small acute to subacute cortical and subcortical infarcts at the left cranial vertex. MRI follow-up as indicated.  No hemorrhage. Findings appear new since CT from July 2022. Electronically Signed   By: Donavan Foil M.D.   On: 02/08/2021 20:58   MR ANGIO HEAD WO CONTRAST  Result Date: 02/09/2021 CLINICAL DATA:  Initial evaluation for acute stroke. EXAM: MRI HEAD WITHOUT CONTRAST MRA HEAD WITHOUT CONTRAST MRA NECK WITHOUT AND WITH CONTRAST TECHNIQUE: Multiplanar, multi-echo pulse sequences of the brain and surrounding structures were acquired without intravenous contrast. Angiographic images of the Circle of Willis were acquired using MRA technique without intravenous contrast. Angiographic images of the neck were acquired using MRA technique without and with  intravenous contrast. Carotid stenosis measurements (when applicable) are obtained utilizing NASCET criteria, using the distal internal carotid diameter as the denominator. CONTRAST:  7.66m GADAVIST GADOBUTROL 1 MMOL/ML IV SOLN COMPARISON:  CT from earlier the same day. FINDINGS: MRI HEAD FINDINGS Brain: Cerebral volume within normal limits for age. Scattered patchy T2/FLAIR hyperintensity within the periventricular deep white matter both cerebral hemispheres most consistent with chronic small vessel ischemic disease, mild for age. Patchy areas of diffusion signal abnormality seen involving the cortical and subcortical aspect of the high left frontal and parietal lobes near the vertex, corresponding with abnormality on prior CT (series 5, image 89). Additional patchy cortical and subcortical diffusion abnormality seen involving the right parietal lobe (series 5, images 85, 82). Associated mild T2/FLAIR signal intensity with some residual ADC signal loss. Findings most consistent with acute to early subacute ischemic infarcts. No associated hemorrhage or mass effect. Otherwise, gray-white matter differentiation maintained with no other acute or subacute ischemia. No encephalomalacia to suggest chronic cortical infarction elsewhere within the brain. No other evidence for acute or chronic intracranial hemorrhage. No mass lesion or midline shift. No hydrocephalus or extra-axial fluid collection. Pituitary gland suprasellar region normal. Midline structures intact. Vascular: Major intracranial vascular flow voids are maintained. Skull and upper cervical spine: Craniocervical junction within normal limits. Bone marrow signal intensity diffusely heterogeneous. No visible focal marrow replacing lesion. No scalp soft tissue abnormality. Sinuses/Orbits: Prior bilateral ocular lens replacement. Scattered mucosal thickening noted within the ethmoidal air cells. Paranasal sinuses are otherwise clear. No significant mastoid  effusion. Inner ear structures grossly normal. Other: None. MRA HEAD FINDINGS Anterior circulation: Examination mildly degraded by motion. Visualized distal cervical segments of the internal carotid arteries are patent with antegrade flow. Petrous segments patent bilaterally. Scattered atheromatous irregularity throughout the carotid siphons without visible high-grade stenosis. There is question of a small 3 mm aneurysm projecting medially from the cavernous left ICA on corresponding MRA of the neck (series 17, image 13), not entirely certain given motion, and not definitely seen on the MRA head portion of this study. A1 segments patent bilaterally. Normal anterior communicating artery complex. Multifocal severe bilateral A2 stenoses are seen (series 5, images 115 and 142 on the right) (series 5, image 136 on the left). ACAs remain patent to their distal aspects. Atheromatous irregularity throughout the M1 segments without high-grade stenosis. Normal MCA bifurcations. Diffuse small vessel atheromatous change seen throughout the distal MCA branches bilaterally without proximal branch occlusion. Posterior circulation: Both vertebral arteries patent to the vertebrobasilar junction without stenosis. Right vertebral artery dominant. Both PICA origins patent and normal. Basilar mildly irregular but patent to its distal aspect without stenosis. Superior cerebral arteries patent bilaterally. Both PCAs primarily supplied via the basilar, although a small right posterior communicating artery is noted. Atheromatous irregularity within the PCAs without high-grade stenosis. Anatomic variants: None significant. MRA NECK FINDINGS Aortic arch: Examination degraded by motion. Visualized  aortic arch normal caliber with normal branch pattern. No significant stenosis seen about the origin the great vessels. Visualized subclavian arteries patent. Right carotid system: Right CCA patent from its origin to the bifurcation without stenosis.  No significant atheromatous stenosis about the right carotid bulb or proximal right ICA. Right ICA somewhat irregular and tortuous but is widely patent to the skull base without stenosis, evidence for dissection, or occlusion. Left carotid system: Left CCA patent from its origin to the bifurcation without stenosis. No more than mild atheromatous irregularity about the left carotid bulb without significant stenosis. Left ICA regular and tortuous but widely patent distally to the skull base without stenosis, evidence for dissection or occlusion. Vertebral arteries: Both vertebral arteries arise from the subclavian arteries. No proximal subclavian artery stenosis. Right vertebral artery dominant. Vertebral arteries tortuous but widely patent within the neck without stenosis, evidence for dissection or occlusion. Other: None IMPRESSION: MRI HEAD: 1. Patchy small volume acute to early subacute ischemic infarcts involving the high left frontal and parietal lobes as well as the right parietal lobe as above. No associated hemorrhage or mass effect. 2. Underlying mild chronic microvascular ischemic disease. MRA HEAD: 1. Negative intracranial MRA for large vessel occlusion. 2. Multifocal severe bilateral A2 stenoses. 3. Moderate diffuse small vessel atheromatous change throughout the intracranial circulation. No other proximal high-grade or correctable stenosis. 4. 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 Attention at follow-up recommended. MRA NECK: 1. Negative MRA of the neck. No hemodynamically significant or critical flow limiting stenosis identified. 2. Diffuse tortuosity of the major arterial vasculature of the neck, suggesting chronic underlying hypertension. Electronically Signed   By: Jeannine Boga M.D.   On: 02/09/2021 00:38   MR Angiogram Neck W or Wo Contrast  Result Date: 02/09/2021 CLINICAL DATA:  Initial evaluation for acute  stroke. EXAM: MRI HEAD WITHOUT CONTRAST MRA HEAD WITHOUT CONTRAST MRA NECK WITHOUT AND WITH CONTRAST TECHNIQUE: Multiplanar, multi-echo pulse sequences of the brain and surrounding structures were acquired without intravenous contrast. Angiographic images of the Circle of Willis were acquired using MRA technique without intravenous contrast. Angiographic images of the neck were acquired using MRA technique without and with intravenous contrast. Carotid stenosis measurements (when applicable) are obtained utilizing NASCET criteria, using the distal internal carotid diameter as the denominator. CONTRAST:  7.74m GADAVIST GADOBUTROL 1 MMOL/ML IV SOLN COMPARISON:  CT from earlier the same day. FINDINGS: MRI HEAD FINDINGS Brain: Cerebral volume within normal limits for age. Scattered patchy T2/FLAIR hyperintensity within the periventricular deep white matter both cerebral hemispheres most consistent with chronic small vessel ischemic disease, mild for age. Patchy areas of diffusion signal abnormality seen involving the cortical and subcortical aspect of the high left frontal and parietal lobes near the vertex, corresponding with abnormality on prior CT (series 5, image 89). Additional patchy cortical and subcortical diffusion abnormality seen involving the right parietal lobe (series 5, images 85, 82). Associated mild T2/FLAIR signal intensity with some residual ADC signal loss. Findings most consistent with acute to early subacute ischemic infarcts. No associated hemorrhage or mass effect. Otherwise, gray-white matter differentiation maintained with no other acute or subacute ischemia. No encephalomalacia to suggest chronic cortical infarction elsewhere within the brain. No other evidence for acute or chronic intracranial hemorrhage. No mass lesion or midline shift. No hydrocephalus or extra-axial fluid collection. Pituitary gland suprasellar region normal. Midline structures intact. Vascular: Major intracranial vascular  flow voids are maintained. Skull and upper cervical  spine: Craniocervical junction within normal limits. Bone marrow signal intensity diffusely heterogeneous. No visible focal marrow replacing lesion. No scalp soft tissue abnormality. Sinuses/Orbits: Prior bilateral ocular lens replacement. Scattered mucosal thickening noted within the ethmoidal air cells. Paranasal sinuses are otherwise clear. No significant mastoid effusion. Inner ear structures grossly normal. Other: None. MRA HEAD FINDINGS Anterior circulation: Examination mildly degraded by motion. Visualized distal cervical segments of the internal carotid arteries are patent with antegrade flow. Petrous segments patent bilaterally. Scattered atheromatous irregularity throughout the carotid siphons without visible high-grade stenosis. There is question of a small 3 mm aneurysm projecting medially from the cavernous left ICA on corresponding MRA of the neck (series 17, image 13), not entirely certain given motion, and not definitely seen on the MRA head portion of this study. A1 segments patent bilaterally. Normal anterior communicating artery complex. Multifocal severe bilateral A2 stenoses are seen (series 5, images 115 and 142 on the right) (series 5, image 136 on the left). ACAs remain patent to their distal aspects. Atheromatous irregularity throughout the M1 segments without high-grade stenosis. Normal MCA bifurcations. Diffuse small vessel atheromatous change seen throughout the distal MCA branches bilaterally without proximal branch occlusion. Posterior circulation: Both vertebral arteries patent to the vertebrobasilar junction without stenosis. Right vertebral artery dominant. Both PICA origins patent and normal. Basilar mildly irregular but patent to its distal aspect without stenosis. Superior cerebral arteries patent bilaterally. Both PCAs primarily supplied via the basilar, although a small right posterior communicating artery is noted.  Atheromatous irregularity within the PCAs without high-grade stenosis. Anatomic variants: None significant. MRA NECK FINDINGS Aortic arch: Examination degraded by motion. Visualized aortic arch normal caliber with normal branch pattern. No significant stenosis seen about the origin the great vessels. Visualized subclavian arteries patent. Right carotid system: Right CCA patent from its origin to the bifurcation without stenosis. No significant atheromatous stenosis about the right carotid bulb or proximal right ICA. Right ICA somewhat irregular and tortuous but is widely patent to the skull base without stenosis, evidence for dissection, or occlusion. Left carotid system: Left CCA patent from its origin to the bifurcation without stenosis. No more than mild atheromatous irregularity about the left carotid bulb without significant stenosis. Left ICA regular and tortuous but widely patent distally to the skull base without stenosis, evidence for dissection or occlusion. Vertebral arteries: Both vertebral arteries arise from the subclavian arteries. No proximal subclavian artery stenosis. Right vertebral artery dominant. Vertebral arteries tortuous but widely patent within the neck without stenosis, evidence for dissection or occlusion. Other: None IMPRESSION: MRI HEAD: 1. Patchy small volume acute to early subacute ischemic infarcts involving the high left frontal and parietal lobes as well as the right parietal lobe as above. No associated hemorrhage or mass effect. 2. Underlying mild chronic microvascular ischemic disease. MRA HEAD: 1. Negative intracranial MRA for large vessel occlusion. 2. Multifocal severe bilateral A2 stenoses. 3. Moderate diffuse small vessel atheromatous change throughout the intracranial circulation. No other proximal high-grade or correctable stenosis. 4. 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 Attention at follow-up  recommended. MRA NECK: 1. Negative MRA of the neck. No hemodynamically significant or critical flow limiting stenosis identified. 2. Diffuse tortuosity of the major arterial vasculature of the neck, suggesting chronic underlying hypertension. Electronically Signed   By: Jeannine Boga M.D.   On: 02/09/2021 00:38   MR BRAIN WO CONTRAST  Result Date: 02/09/2021 CLINICAL DATA:  Initial evaluation for acute stroke. EXAM:  MRI HEAD WITHOUT CONTRAST MRA HEAD WITHOUT CONTRAST MRA NECK WITHOUT AND WITH CONTRAST TECHNIQUE: Multiplanar, multi-echo pulse sequences of the brain and surrounding structures were acquired without intravenous contrast. Angiographic images of the Circle of Willis were acquired using MRA technique without intravenous contrast. Angiographic images of the neck were acquired using MRA technique without and with intravenous contrast. Carotid stenosis measurements (when applicable) are obtained utilizing NASCET criteria, using the distal internal carotid diameter as the denominator. CONTRAST:  7.32m GADAVIST GADOBUTROL 1 MMOL/ML IV SOLN COMPARISON:  CT from earlier the same day. FINDINGS: MRI HEAD FINDINGS Brain: Cerebral volume within normal limits for age. Scattered patchy T2/FLAIR hyperintensity within the periventricular deep white matter both cerebral hemispheres most consistent with chronic small vessel ischemic disease, mild for age. Patchy areas of diffusion signal abnormality seen involving the cortical and subcortical aspect of the high left frontal and parietal lobes near the vertex, corresponding with abnormality on prior CT (series 5, image 89). Additional patchy cortical and subcortical diffusion abnormality seen involving the right parietal lobe (series 5, images 85, 82). Associated mild T2/FLAIR signal intensity with some residual ADC signal loss. Findings most consistent with acute to early subacute ischemic infarcts. No associated hemorrhage or mass effect. Otherwise, gray-white  matter differentiation maintained with no other acute or subacute ischemia. No encephalomalacia to suggest chronic cortical infarction elsewhere within the brain. No other evidence for acute or chronic intracranial hemorrhage. No mass lesion or midline shift. No hydrocephalus or extra-axial fluid collection. Pituitary gland suprasellar region normal. Midline structures intact. Vascular: Major intracranial vascular flow voids are maintained. Skull and upper cervical spine: Craniocervical junction within normal limits. Bone marrow signal intensity diffusely heterogeneous. No visible focal marrow replacing lesion. No scalp soft tissue abnormality. Sinuses/Orbits: Prior bilateral ocular lens replacement. Scattered mucosal thickening noted within the ethmoidal air cells. Paranasal sinuses are otherwise clear. No significant mastoid effusion. Inner ear structures grossly normal. Other: None. MRA HEAD FINDINGS Anterior circulation: Examination mildly degraded by motion. Visualized distal cervical segments of the internal carotid arteries are patent with antegrade flow. Petrous segments patent bilaterally. Scattered atheromatous irregularity throughout the carotid siphons without visible high-grade stenosis. There is question of a small 3 mm aneurysm projecting medially from the cavernous left ICA on corresponding MRA of the neck (series 17, image 13), not entirely certain given motion, and not definitely seen on the MRA head portion of this study. A1 segments patent bilaterally. Normal anterior communicating artery complex. Multifocal severe bilateral A2 stenoses are seen (series 5, images 115 and 142 on the right) (series 5, image 136 on the left). ACAs remain patent to their distal aspects. Atheromatous irregularity throughout the M1 segments without high-grade stenosis. Normal MCA bifurcations. Diffuse small vessel atheromatous change seen throughout the distal MCA branches bilaterally without proximal branch occlusion.  Posterior circulation: Both vertebral arteries patent to the vertebrobasilar junction without stenosis. Right vertebral artery dominant. Both PICA origins patent and normal. Basilar mildly irregular but patent to its distal aspect without stenosis. Superior cerebral arteries patent bilaterally. Both PCAs primarily supplied via the basilar, although a small right posterior communicating artery is noted. Atheromatous irregularity within the PCAs without high-grade stenosis. Anatomic variants: None significant. MRA NECK FINDINGS Aortic arch: Examination degraded by motion. Visualized aortic arch normal caliber with normal branch pattern. No significant stenosis seen about the origin the great vessels. Visualized subclavian arteries patent. Right carotid system: Right CCA patent from its origin to the bifurcation without stenosis. No significant atheromatous stenosis about the right carotid  bulb or proximal right ICA. Right ICA somewhat irregular and tortuous but is widely patent to the skull base without stenosis, evidence for dissection, or occlusion. Left carotid system: Left CCA patent from its origin to the bifurcation without stenosis. No more than mild atheromatous irregularity about the left carotid bulb without significant stenosis. Left ICA regular and tortuous but widely patent distally to the skull base without stenosis, evidence for dissection or occlusion. Vertebral arteries: Both vertebral arteries arise from the subclavian arteries. No proximal subclavian artery stenosis. Right vertebral artery dominant. Vertebral arteries tortuous but widely patent within the neck without stenosis, evidence for dissection or occlusion. Other: None IMPRESSION: MRI HEAD: 1. Patchy small volume acute to early subacute ischemic infarcts involving the high left frontal and parietal lobes as well as the right parietal lobe as above. No associated hemorrhage or mass effect. 2. Underlying mild chronic microvascular ischemic  disease. MRA HEAD: 1. Negative intracranial MRA for large vessel occlusion. 2. Multifocal severe bilateral A2 stenoses. 3. Moderate diffuse small vessel atheromatous change throughout the intracranial circulation. No other proximal high-grade or correctable stenosis. 4. 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 Attention at follow-up recommended. MRA NECK: 1. Negative MRA of the neck. No hemodynamically significant or critical flow limiting stenosis identified. 2. Diffuse tortuosity of the major arterial vasculature of the neck, suggesting chronic underlying hypertension. Electronically Signed   By: Jeannine Boga M.D.   On: 02/09/2021 00:38   DG Pelvis Portable  Result Date: 02/08/2021 CLINICAL DATA:  Fall EXAM: PORTABLE PELVIS 1-2 VIEWS COMPARISON:  None. FINDINGS: No fracture or dislocation is seen. Bilateral hip joint spaces are preserved. Visualized bony pelvis appears intact. Degenerative changes of the lower lumbar spine. IMPRESSION: Negative. Electronically Signed   By: Julian Hy M.D.   On: 02/08/2021 20:27   DG Chest Port 1 View  Result Date: 02/08/2021 CLINICAL DATA:  Fall EXAM: PORTABLE CHEST 1 VIEW COMPARISON:  10/22/2020 FINDINGS: Lungs are clear.  No pleural effusion or pneumothorax. The heart is top-normal in size.  Thoracic aortic atherosclerosis. Surgical clips along the right lateral chest wall. IMPRESSION: No evidence of acute cardiopulmonary disease. Electronically Signed   By: Julian Hy M.D.   On: 02/08/2021 20:27   EEG adult  Result Date: 02/09/2021 Lora Havens, MD     02/09/2021 10:42 AM Patient Name: Lindsay Martinez MRN: [id number] Epilepsy Attending: Lora Havens Referring Physician/Provider: Dr Lesleigh Noe Date: 02/09/2021 Duration: 21.49 mins Patient history: 87 year old female with acute onset confusion.  EEG to evaluate seizures. Level of alertness: Awake, asleep AEDs during  EEG study: None Technical aspects: This EEG study was done with scalp electrodes positioned according to the 10-20 International system of electrode placement. Electrical activity was acquired at a sampling rate of '500Hz'$  and reviewed with a high frequency filter of '70Hz'$  and a low frequency filter of '1Hz'$ . EEG data were recorded continuously and digitally stored. Description: The posterior dominant rhythm consists of 7.5 Hz activity of moderate voltage (25-35 uV) seen predominantly in posterior head regions, symmetric and reactive to eye opening and eye closing. Sleep was characterized by vertex waves, sleep spindles (12 to 14 Hz), maximal frontocentral region.  EEG showed continuous generalized 5 to 6 Hz theta slowing. Physiologic photic driving was not seen during photic stimulation.  Hyperventilation was not performed.   ABNORMALITY - Continuous slow, generalized IMPRESSION: This study is suggestive of mild diffuse encephalopathy, nonspecific etiology. No seizures  or epileptiform discharges were seen throughout the recording. Lora Havens   ECHOCARDIOGRAM COMPLETE  Result Date: 02/09/2021    ECHOCARDIOGRAM REPORT   Patient Name:   Lindsay Martinez Date of Exam: 02/09/2021 Medical Rec #:  [id number]       Height:       58.0 in Accession #:    [id number]      Weight:       170.0 lb Date of Birth:  Oct 08, 1930       BSA:          1.700 m Patient Age:    30 years        BP:           140/55 mmHg Patient Gender: F               HR:           69 bpm. Exam Location:  Inpatient Procedure: 2D Echo, Color Doppler and Cardiac Doppler Indications:    Stroke i63.9  History:        Patient has no prior history of Echocardiogram examinations.                 Risk Factors:Hypertension, Diabetes and Dyslipidemia.  Sonographer:    Raquel Sarna Senior RDCS Referring Phys: [id number] [place]  1. Left ventricular ejection fraction, by estimation, is 70 to 75%. The left ventricle has hyperdynamic function. The left  ventricle has no regional wall motion abnormalities. There is mild left ventricular hypertrophy. Left ventricular diastolic parameters are consistent with Grade I diastolic dysfunction (impaired relaxation).  2. Right ventricular systolic function is normal. The right ventricular size is moderately enlarged. There is moderately elevated pulmonary artery systolic pressure. The estimated right ventricular systolic pressure is Q000111Q mmHg.  3. Left atrial size was mild to moderately dilated.  4. The mitral valve is normal in structure. Mild mitral valve regurgitation. No evidence of mitral stenosis.  5. The aortic valve is normal in structure. Aortic valve regurgitation is not visualized. No aortic stenosis is present.  6. The inferior vena cava is normal in size with greater than 50% respiratory variability, suggesting right atrial pressure of 3 mmHg. Conclusion(s)/Recommendation(s): No intracardiac source of embolism detected on this transthoracic study. A transesophageal echocardiogram is recommended to exclude cardiac source of embolism if clinically indicated. FINDINGS  Left Ventricle: Left ventricular ejection fraction, by estimation, is 70 to 75%. The left ventricle has hyperdynamic function. The left ventricle has no regional wall motion abnormalities. The left ventricular internal cavity size was normal in size. There is mild left ventricular hypertrophy. Left ventricular diastolic parameters are consistent with Grade I diastolic dysfunction (impaired relaxation). Right Ventricle: The right ventricular size is moderately enlarged. No increase in right ventricular wall thickness. Right ventricular systolic function is normal. There is moderately elevated pulmonary artery systolic pressure. The tricuspid regurgitant  velocity is 3.41 m/s, and with an assumed right atrial pressure of 3 mmHg, the estimated right ventricular systolic pressure is Q000111Q mmHg. Left Atrium: Left atrial size was mild to moderately dilated.  Right Atrium: Right atrial size was normal in size. Pericardium: There is no evidence of pericardial effusion. Mitral Valve: The mitral valve is normal in structure. Mild mitral valve regurgitation. No evidence of mitral valve stenosis. Tricuspid Valve: The tricuspid valve is normal in structure. Tricuspid valve regurgitation is mild . No evidence of tricuspid stenosis. Aortic Valve: The aortic valve is normal in structure. Aortic valve regurgitation is not visualized.  No aortic stenosis is present. Pulmonic Valve: The pulmonic valve was normal in structure. Pulmonic valve regurgitation is not visualized. No evidence of pulmonic stenosis. Aorta: The aortic root is normal in size and structure. Venous: The inferior vena cava is normal in size with greater than 50% respiratory variability, suggesting right atrial pressure of 3 mmHg. IAS/Shunts: No atrial level shunt detected by color flow Doppler.  LEFT VENTRICLE PLAX 2D LVIDd:         3.70 cm LVIDs:         2.30 cm LV PW:         1.20 cm LV IVS:        1.00 cm LVOT diam:     1.90 cm LV SV:         71 LV SV Index:   42 LVOT Area:     2.84 cm  RIGHT VENTRICLE RV Basal diam:  4.60 cm RV S prime:     11.40 cm/s TAPSE (M-mode): 2.0 cm LEFT ATRIUM              Index       RIGHT ATRIUM           Index LA diam:        4.30 cm  2.53 cm/m  RA Area:     18.80 cm LA Vol (A2C):   44.4 ml  26.12 ml/m RA Volume:   50.50 ml  29.71 ml/m LA Vol (A4C):   104.0 ml 61.19 ml/m LA Biplane Vol: 73.1 ml  43.01 ml/m  AORTIC VALVE LVOT Vmax:   118.00 cm/s LVOT Vmean:  71.700 cm/s LVOT VTI:    0.249 m  AORTA Ao Root diam: 2.50 cm Ao Asc diam:  2.20 cm TRICUSPID VALVE TR Peak grad:   46.5 mmHg TR Vmax:        341.00 cm/s  SHUNTS Systemic VTI:  0.25 m Systemic Diam: 1.90 cm Candee Furbish MD Electronically signed by Candee Furbish MD Signature Date/Time: 02/09/2021/11:56:18 AM    Final       Subjective: Patient seen and examined at the bedside this morning.  Hemodynamically stable for  discharge today.  Discharge Exam: Vitals:   02/13/21 1057 02/13/21 1718  BP: (!) 145/59 (!) 176/86  Pulse: 77 80  Resp: (!) 21 20  Temp: 98 F (36.7 C) 98.2 F (36.8 C)  SpO2: 100%    Vitals:   02/13/21 0400 02/13/21 0639 02/13/21 1057 02/13/21 1718  BP: 126/85 (!) 113/52 (!) 145/59 (!) 176/86  Pulse:   77 80  Resp: 18  (!) 21 20  Temp: 97.9 F (36.6 C)  98 F (36.7 C) 98.2 F (36.8 C)  TempSrc: Oral  Oral Axillary  SpO2:   100%   Weight:      Height:        General: Pt is alert, awake, not in acute distress Cardiovascular: RRR, S1/S2 +, no rubs, no gallops Respiratory: CTA bilaterally, no wheezing, no rhonchi Abdominal: Soft, NT, ND, bowel sounds + Extremities: no edema, no cyanosis    The results of significant diagnostics from this hospitalization (including imaging, microbiology, ancillary and laboratory) are listed below for reference.     Microbiology: Recent Results (from the past 240 hour(s))  Urine Culture     Status: Abnormal   Collection Time: 02/08/21  7:44 PM   Specimen: Urine, Clean Catch  Result Value Ref Range Status   Specimen Description URINE, CLEAN CATCH  Final   Special Requests  Final    NONE Performed at [facility], [place] [address] [place], [place] [address]    Culture 80,000 COLONIES/mL KLEBSIELLA PNEUMONIAE (A)  Final   Report Status 02/11/2021 FINAL  Final   Organism ID, Bacteria KLEBSIELLA PNEUMONIAE (A)  Final      Susceptibility   Klebsiella pneumoniae - MIC*    AMPICILLIN RESISTANT Resistant     CEFAZOLIN <=4 SENSITIVE Sensitive     CEFEPIME <=0.12 SENSITIVE Sensitive     CEFTRIAXONE <=0.25 SENSITIVE Sensitive     CIPROFLOXACIN <=0.25 SENSITIVE Sensitive     GENTAMICIN <=1 SENSITIVE Sensitive     IMIPENEM <=0.25 SENSITIVE Sensitive     NITROFURANTOIN 32 SENSITIVE Sensitive     TRIMETH/SULFA <=20 SENSITIVE Sensitive     AMPICILLIN/SULBACTAM 4 SENSITIVE Sensitive     PIP/TAZO <=4 SENSITIVE Sensitive     * 80,000  COLONIES/mL KLEBSIELLA PNEUMONIAE  Resp Panel by RT-PCR (Flu A&B, Covid) Nasopharyngeal Swab     Status: None   Collection Time: 02/08/21  7:46 PM   Specimen: Nasopharyngeal Swab; Nasopharyngeal(NP) swabs in vial transport medium  Result Value Ref Range Status   SARS Coronavirus 2 by RT PCR NEGATIVE NEGATIVE Final    Comment: (NOTE) SARS-CoV-2 target nucleic acids are NOT DETECTED.  The SARS-CoV-2 RNA is generally detectable in upper respiratory specimens during the acute phase of infection. The lowest concentration of SARS-CoV-2 viral copies this assay can detect is 138 copies/mL. A negative result does not preclude SARS-Cov-2 infection and should not be used as the sole basis for treatment or other patient management decisions. A negative result may occur with  improper specimen collection/handling, submission of specimen other than nasopharyngeal swab, presence of viral mutation(s) within the areas targeted by this assay, and inadequate number of viral copies(<138 copies/mL). A negative result must be combined with clinical observations, patient history, and epidemiological information. The expected result is Negative.  Fact Sheet for Patients:  EntrepreneurPulse.com.au  Fact Sheet for Healthcare Providers:  IncredibleEmployment.be  This test is no t yet approved or cleared by the [place] FDA and  has been authorized for detection and/or diagnosis of SARS-CoV-2 by FDA under an Emergency Use Authorization (EUA). This EUA will remain  in effect (meaning this test can be used) for the duration of the COVID-19 declaration under Section 564(b)(1) of the Act, 21 U.S.C.section 360bbb-3(b)(1), unless the authorization is terminated  or revoked sooner.       Influenza A by PCR NEGATIVE NEGATIVE Final   Influenza B by PCR NEGATIVE NEGATIVE Final    Comment: (NOTE) The Xpert Xpress SARS-CoV-2/FLU/RSV plus assay is intended as an aid in the  diagnosis of influenza from Nasopharyngeal swab specimens and should not be used as a sole basis for treatment. Nasal washings and aspirates are unacceptable for Xpert Xpress SARS-CoV-2/FLU/RSV testing.  Fact Sheet for Patients: EntrepreneurPulse.com.au  Fact Sheet for Healthcare Providers: IncredibleEmployment.be  This test is not yet approved or cleared by the [place] FDA and has been authorized for detection and/or diagnosis of SARS-CoV-2 by FDA under an Emergency Use Authorization (EUA). This EUA will remain in effect (meaning this test can be used) for the duration of the COVID-19 declaration under Section 564(b)(1) of the Act, 21 U.S.C. section 360bbb-3(b)(1), unless the authorization is terminated or revoked.  Performed at [facility], [place] [address], [place], [place] [address]   Culture, blood (routine x 2)     Status: Abnormal   Collection Time: 02/09/21  5:02  AM   Specimen: BLOOD RIGHT FOREARM  Result Value Ref Range Status   Specimen Description BLOOD RIGHT FOREARM  Final   Special Requests   Final    BOTTLES DRAWN AEROBIC AND ANAEROBIC Blood Culture results may not be optimal due to an inadequate volume of blood received in culture bottles   Culture  Setup Time   Final    GRAM POSITIVE COCCI IN CLUSTERS AEROBIC BOTTLE ONLY CRITICAL RESULT CALLED TO, READ BACK BY AND VERIFIED WITH: PHARM D A.LAWLESS ON [id number] AT 0952 BY E.PARRISH    Culture (A)  Final    STAPHYLOCOCCUS EPIDERMIDIS THE SIGNIFICANCE OF ISOLATING THIS ORGANISM FROM A SINGLE VENIPUNCTURE CANNOT BE PREDICTED WITHOUT FURTHER CLINICAL AND CULTURE CORRELATION. SUSCEPTIBILITIES AVAILABLE ONLY ON REQUEST. Performed at [facility], [place] [address] [place], [place] [address]    Report Status 02/14/2021 FINAL  Final  Blood Culture ID Panel (Reflexed)     Status: Abnormal   Collection Time: 02/09/21  5:02 AM  Result Value Ref Range Status   Enterococcus  faecalis NOT DETECTED NOT DETECTED Final   Enterococcus Faecium NOT DETECTED NOT DETECTED Final   Listeria monocytogenes NOT DETECTED NOT DETECTED Final   Staphylococcus species DETECTED (A) NOT DETECTED Final    Comment: CRITICAL RESULT CALLED TO, READ BACK BY AND VERIFIED WITH: PHARM D A.LAWLESS ON [id number] AT 0952 BY E.PARRISH    Staphylococcus aureus (BCID) NOT DETECTED NOT DETECTED Final   Staphylococcus epidermidis DETECTED (A) NOT DETECTED Final    Comment: Methicillin (oxacillin) resistant coagulase negative staphylococcus. Possible blood culture contaminant (unless isolated from more than one blood culture draw or clinical case suggests pathogenicity). No antibiotic treatment is indicated for blood  culture contaminants. CRITICAL RESULT CALLED TO, READ BACK BY AND VERIFIED WITH: PHARM D A.LAWLESS ON [id number] AT 0952 BY E.PARRISH    Staphylococcus lugdunensis NOT DETECTED NOT DETECTED Final   Streptococcus species NOT DETECTED NOT DETECTED Final   Streptococcus agalactiae NOT DETECTED NOT DETECTED Final   Streptococcus pneumoniae NOT DETECTED NOT DETECTED Final   Streptococcus pyogenes NOT DETECTED NOT DETECTED Final   A.calcoaceticus-baumannii NOT DETECTED NOT DETECTED Final   Bacteroides fragilis NOT DETECTED NOT DETECTED Final   Enterobacterales NOT DETECTED NOT DETECTED Final   Enterobacter cloacae complex NOT DETECTED NOT DETECTED Final   Escherichia coli NOT DETECTED NOT DETECTED Final   Klebsiella aerogenes NOT DETECTED NOT DETECTED Final   Klebsiella oxytoca NOT DETECTED NOT DETECTED Final   Klebsiella pneumoniae NOT DETECTED NOT DETECTED Final   Proteus species NOT DETECTED NOT DETECTED Final   Salmonella species NOT DETECTED NOT DETECTED Final   Serratia marcescens NOT DETECTED NOT DETECTED Final   Haemophilus influenzae NOT DETECTED NOT DETECTED Final   Neisseria meningitidis NOT DETECTED NOT DETECTED Final   Pseudomonas aeruginosa NOT DETECTED NOT DETECTED Final    Stenotrophomonas maltophilia NOT DETECTED NOT DETECTED Final   Candida albicans NOT DETECTED NOT DETECTED Final   Candida auris NOT DETECTED NOT DETECTED Final   Candida glabrata NOT DETECTED NOT DETECTED Final   Candida krusei NOT DETECTED NOT DETECTED Final   Candida parapsilosis NOT DETECTED NOT DETECTED Final   Candida tropicalis NOT DETECTED NOT DETECTED Final   Cryptococcus neoformans/gattii NOT DETECTED NOT DETECTED Final   Methicillin resistance mecA/C DETECTED (A) NOT DETECTED Final    Comment: CRITICAL RESULT CALLED TO, READ BACK BY AND VERIFIED WITH: Vena Austria A.LAWLESS ON [id number] AT 0952 BY E.PARRISH Performed at [facility], [address] [place], [place] [address]   Resp Panel by RT-PCR (Flu A&B, Covid) Nasopharyngeal Swab     Status: None   Collection Time: 02/13/21  4:33 AM   Specimen: Nasopharyngeal Swab; Nasopharyngeal(NP) swabs in vial transport medium  Result Value Ref Range Status   SARS Coronavirus 2 by RT PCR NEGATIVE NEGATIVE Final    Comment: (NOTE) SARS-CoV-2 target nucleic acids are NOT DETECTED.  The SARS-CoV-2 RNA is generally detectable in upper respiratory specimens during the acute phase of infection. The lowest concentration of SARS-CoV-2 viral copies this assay can detect is 138 copies/mL. A negative result does not preclude SARS-Cov-2 infection and should not be used as the sole basis for treatment or other patient management decisions. A negative result may occur with  improper specimen collection/handling, submission of specimen other than nasopharyngeal swab, presence of viral mutation(s) within the areas targeted by this assay, and inadequate number of viral copies(<138 copies/mL). A negative result must be combined with clinical observations, patient history, and epidemiological information. The expected result is Negative.  Fact Sheet for Patients:  EntrepreneurPulse.com.au  Fact Sheet for Healthcare Providers:   IncredibleEmployment.be  This test is no t yet approved or cleared by the [place] FDA and  has been authorized for detection and/or diagnosis of SARS-CoV-2 by FDA under an Emergency Use Authorization (EUA). This EUA will remain  in effect (meaning this test can be used) for the duration of the COVID-19 declaration under Section 564(b)(1) of the Act, 21 U.S.C.section 360bbb-3(b)(1), unless the authorization is terminated  or revoked sooner.       Influenza A by PCR NEGATIVE NEGATIVE Final   Influenza B by PCR NEGATIVE NEGATIVE Final    Comment: (NOTE) The Xpert Xpress SARS-CoV-2/FLU/RSV plus assay is intended as an aid in the diagnosis of influenza from Nasopharyngeal swab specimens and should not be used as a sole basis for treatment. Nasal washings and aspirates are unacceptable for Xpert Xpress SARS-CoV-2/FLU/RSV testing.  Fact Sheet for Patients: EntrepreneurPulse.com.au  Fact Sheet for Healthcare Providers: IncredibleEmployment.be  This test is not yet approved or cleared by the [place] FDA and has been authorized for detection and/or diagnosis of SARS-CoV-2 by FDA under an Emergency Use Authorization (EUA). This EUA will remain in effect (meaning this test can be used) for the duration of the COVID-19 declaration under Section 564(b)(1) of the Act, 21 U.S.C. section 360bbb-3(b)(1), unless the authorization is terminated or revoked.  Performed at [facility], [place] [address] [place], [place] [address]      Labs: BNP (last 3 results) Recent Labs    02/08/21 2013  BNP [id number]   Basic Metabolic Panel: Recent Labs  Lab 02/08/21 2013 02/09/21 0507 02/10/21 0203 02/11/21 0245 02/12/21 0507  NA 135 137 137 136 139  K 4.5 4.2 4.6 5.4* 3.9  CL 106 113* 110 111 109  CO2 20* 17* 21* 19* 23  GLUCOSE 91 89 117* 110* 109*  BUN 49* 37* 29* 23 20  CREATININE 2.71* 1.66* 1.62* 1.30* 1.26*  CALCIUM  10.1 8.6* 9.5 9.3 9.7   Liver Function Tests: Recent Labs  Lab 02/08/21 2013 02/09/21 0507  AST 49* 40  ALT 25 18  ALKPHOS 58 42  BILITOT 0.4 0.7  PROT 6.3* 4.6*  ALBUMIN 3.5 2.5*   No results for input(s): LIPASE, AMYLASE in the last 168 hours. No results for input(s): AMMONIA in the last 168 hours. CBC: Recent Labs  Lab 02/08/21 2013 02/09/21 0507  WBC 12.2* 7.2  NEUTROABS 9.1* 4.2  HGB 10.7* 9.0*  HCT 34.5* 30.6*  MCV 86.5 91.6  PLT 308 226   Cardiac Enzymes: Recent Labs  Lab 02/08/21 2013 02/09/21 0507  CKTOTAL 1,211* 869*   BNP: Invalid input(s): POCBNP CBG: Recent Labs  Lab 02/12/21 1247 02/12/21 1712 02/12/21 2033 02/13/21 0757 02/13/21 1230  GLUCAP 99 110* 118* 110* 117*   D-Dimer No results for input(s): DDIMER in the last 72 hours. Hgb A1c No results for input(s): HGBA1C in the last 72 hours.  Lipid Profile No results for input(s): CHOL, HDL, LDLCALC, TRIG, CHOLHDL, LDLDIRECT in the last 72 hours.  Thyroid function studies No results for input(s): TSH, T4TOTAL, T3FREE, THYROIDAB in the last 72 hours.  Invalid input(s): FREET3 Anemia work up No results for input(s): VITAMINB12, FOLATE, FERRITIN, TIBC, IRON, RETICCTPCT in the last 72 hours. Urinalysis    Component Value Date/Time   COLORURINE YELLOW 02/08/2021 2246   APPEARANCEUR CLOUDY (A) 02/08/2021 2246   LABSPEC 1.020 02/08/2021 2246   PHURINE 5.0 02/08/2021 2246   GLUCOSEU NEGATIVE 02/08/2021 2246   HGBUR NEGATIVE 02/08/2021 2246   BILIRUBINUR NEGATIVE 02/08/2021 2246   KETONESUR NEGATIVE 02/08/2021 2246   PROTEINUR NEGATIVE 02/08/2021 2246   NITRITE NEGATIVE 02/08/2021 2246   LEUKOCYTESUR MODERATE (A) 02/08/2021 2246   Sepsis Labs Invalid input(s): PROCALCITONIN,  WBC,  LACTICIDVEN Microbiology Recent Results (from the past 240 hour(s))  Urine Culture     Status: Abnormal   Collection Time: 02/08/21  7:44 PM   Specimen: Urine, Clean Catch  Result Value Ref Range Status    Specimen Description URINE, CLEAN CATCH  Final   Special Requests   Final    NONE Performed at [facility], [place] [address] [place], [place] [address]    Culture 80,000 COLONIES/mL KLEBSIELLA PNEUMONIAE (A)  Final   Report Status 02/11/2021 FINAL  Final   Organism ID, Bacteria KLEBSIELLA PNEUMONIAE (A)  Final      Susceptibility   Klebsiella pneumoniae - MIC*    AMPICILLIN RESISTANT Resistant     CEFAZOLIN <=4 SENSITIVE Sensitive     CEFEPIME <=0.12 SENSITIVE Sensitive     CEFTRIAXONE <=0.25 SENSITIVE Sensitive     CIPROFLOXACIN <=0.25 SENSITIVE Sensitive     GENTAMICIN <=1 SENSITIVE Sensitive     IMIPENEM <=0.25 SENSITIVE Sensitive     NITROFURANTOIN 32 SENSITIVE Sensitive     TRIMETH/SULFA <=20 SENSITIVE Sensitive     AMPICILLIN/SULBACTAM 4 SENSITIVE Sensitive     PIP/TAZO <=4 SENSITIVE Sensitive     * 80,000 COLONIES/mL KLEBSIELLA PNEUMONIAE  Resp Panel by RT-PCR (Flu A&B, Covid) Nasopharyngeal Swab     Status: None   Collection Time: 02/08/21  7:46 PM   Specimen: Nasopharyngeal Swab; Nasopharyngeal(NP) swabs in vial transport medium  Result Value Ref Range Status   SARS Coronavirus 2 by RT PCR NEGATIVE NEGATIVE Final    Comment: (NOTE) SARS-CoV-2 target nucleic acids are NOT DETECTED.  The SARS-CoV-2 RNA is generally detectable in upper respiratory specimens during the acute phase of infection. The lowest concentration of SARS-CoV-2 viral copies this assay can detect is 138 copies/mL. A negative result does not preclude SARS-Cov-2 infection and should not be used as the sole basis for treatment or other patient management decisions. A negative result may occur with  improper specimen collection/handling, submission of specimen other than nasopharyngeal swab, presence of viral mutation(s) within the areas targeted by this assay, and inadequate number of viral copies(<138 copies/mL). A negative result must be combined with clinical  observations, patient history,  and epidemiological information. The expected result is Negative.  Fact Sheet for Patients:  EntrepreneurPulse.com.au  Fact Sheet for Healthcare Providers:  IncredibleEmployment.be  This test is no t yet approved or cleared by the [place] FDA and  has been authorized for detection and/or diagnosis of SARS-CoV-2 by FDA under an Emergency Use Authorization (EUA). This EUA will remain  in effect (meaning this test can be used) for the duration of the COVID-19 declaration under Section 564(b)(1) of the Act, 21 U.S.C.section 360bbb-3(b)(1), unless the authorization is terminated  or revoked sooner.       Influenza A by PCR NEGATIVE NEGATIVE Final   Influenza B by PCR NEGATIVE NEGATIVE Final    Comment: (NOTE) The Xpert Xpress SARS-CoV-2/FLU/RSV plus assay is intended as an aid in the diagnosis of influenza from Nasopharyngeal swab specimens and should not be used as a sole basis for treatment. Nasal washings and aspirates are unacceptable for Xpert Xpress SARS-CoV-2/FLU/RSV testing.  Fact Sheet for Patients: EntrepreneurPulse.com.au  Fact Sheet for Healthcare Providers: IncredibleEmployment.be  This test is not yet approved or cleared by the [place] FDA and has been authorized for detection and/or diagnosis of SARS-CoV-2 by FDA under an Emergency Use Authorization (EUA). This EUA will remain in effect (meaning this test can be used) for the duration of the COVID-19 declaration under Section 564(b)(1) of the Act, 21 U.S.C. section 360bbb-3(b)(1), unless the authorization is terminated or revoked.  Performed at [facility], [place] [address] [place], [place] [address]   Culture, blood (routine x 2)     Status: Abnormal   Collection Time: 02/09/21  5:02 AM   Specimen: BLOOD RIGHT FOREARM  Result Value Ref Range Status   Specimen Description BLOOD RIGHT FOREARM  Final   Special Requests    Final    BOTTLES DRAWN AEROBIC AND ANAEROBIC Blood Culture results may not be optimal due to an inadequate volume of blood received in culture bottles   Culture  Setup Time   Final    GRAM POSITIVE COCCI IN CLUSTERS AEROBIC BOTTLE ONLY CRITICAL RESULT CALLED TO, READ BACK BY AND VERIFIED WITH: PHARM D A.LAWLESS ON GO:[id number] AT 0952 BY E.PARRISH    Culture (A)  Final    STAPHYLOCOCCUS EPIDERMIDIS THE SIGNIFICANCE OF ISOLATING THIS ORGANISM FROM A SINGLE VENIPUNCTURE CANNOT BE PREDICTED WITHOUT FURTHER CLINICAL AND CULTURE CORRELATION. SUSCEPTIBILITIES AVAILABLE ONLY ON REQUEST. Performed at [facility], [place] [address] [place], [place] [address]    Report Status 02/14/2021 FINAL  Final  Blood Culture ID Panel (Reflexed)     Status: Abnormal   Collection Time: 02/09/21  5:02 AM  Result Value Ref Range Status   Enterococcus faecalis NOT DETECTED NOT DETECTED Final   Enterococcus Faecium NOT DETECTED NOT DETECTED Final   Listeria monocytogenes NOT DETECTED NOT DETECTED Final   Staphylococcus species DETECTED (A) NOT DETECTED Final    Comment: CRITICAL RESULT CALLED TO, READ BACK BY AND VERIFIED WITH: PHARM D A.LAWLESS ON GO:[id number] AT 0952 BY E.PARRISH    Staphylococcus aureus (BCID) NOT DETECTED NOT DETECTED Final   Staphylococcus epidermidis DETECTED (A) NOT DETECTED Final    Comment: Methicillin (oxacillin) resistant coagulase negative staphylococcus. Possible blood culture contaminant (unless isolated from more than one blood culture draw or clinical case suggests pathogenicity). No antibiotic treatment is indicated for blood  culture contaminants. CRITICAL RESULT CALLED TO, READ BACK BY AND VERIFIED WITH: PHARM D A.LAWLESS ON GO:[id number] AT 0952 BY E.PARRISH  Staphylococcus lugdunensis NOT DETECTED NOT DETECTED Final   Streptococcus species NOT DETECTED NOT DETECTED Final   Streptococcus agalactiae NOT DETECTED NOT DETECTED Final   Streptococcus pneumoniae NOT DETECTED NOT  DETECTED Final   Streptococcus pyogenes NOT DETECTED NOT DETECTED Final   A.calcoaceticus-baumannii NOT DETECTED NOT DETECTED Final   Bacteroides fragilis NOT DETECTED NOT DETECTED Final   Enterobacterales NOT DETECTED NOT DETECTED Final   Enterobacter cloacae complex NOT DETECTED NOT DETECTED Final   Escherichia coli NOT DETECTED NOT DETECTED Final   Klebsiella aerogenes NOT DETECTED NOT DETECTED Final   Klebsiella oxytoca NOT DETECTED NOT DETECTED Final   Klebsiella pneumoniae NOT DETECTED NOT DETECTED Final   Proteus species NOT DETECTED NOT DETECTED Final   Salmonella species NOT DETECTED NOT DETECTED Final   Serratia marcescens NOT DETECTED NOT DETECTED Final   Haemophilus influenzae NOT DETECTED NOT DETECTED Final   Neisseria meningitidis NOT DETECTED NOT DETECTED Final   Pseudomonas aeruginosa NOT DETECTED NOT DETECTED Final   Stenotrophomonas maltophilia NOT DETECTED NOT DETECTED Final   Candida albicans NOT DETECTED NOT DETECTED Final   Candida auris NOT DETECTED NOT DETECTED Final   Candida glabrata NOT DETECTED NOT DETECTED Final   Candida krusei NOT DETECTED NOT DETECTED Final   Candida parapsilosis NOT DETECTED NOT DETECTED Final   Candida tropicalis NOT DETECTED NOT DETECTED Final   Cryptococcus neoformans/gattii NOT DETECTED NOT DETECTED Final   Methicillin resistance mecA/C DETECTED (A) NOT DETECTED Final    Comment: CRITICAL RESULT CALLED TO, READ BACK BY AND VERIFIED WITH: Vena Austria A.LAWLESS ON GO:[id number] AT 0952 BY E.PARRISH Performed at [facility], [address] [place], [place] [address]   Resp Panel by RT-PCR (Flu A&B, Covid) Nasopharyngeal Swab     Status: None   Collection Time: 02/13/21  4:33 AM   Specimen: Nasopharyngeal Swab; Nasopharyngeal(NP) swabs in vial transport medium  Result Value Ref Range Status   SARS Coronavirus 2 by RT PCR NEGATIVE NEGATIVE Final    Comment: (NOTE) SARS-CoV-2 target nucleic acids are NOT DETECTED.  The SARS-CoV-2 RNA  is generally detectable in upper respiratory specimens during the acute phase of infection. The lowest concentration of SARS-CoV-2 viral copies this assay can detect is 138 copies/mL. A negative result does not preclude SARS-Cov-2 infection and should not be used as the sole basis for treatment or other patient management decisions. A negative result may occur with  improper specimen collection/handling, submission of specimen other than nasopharyngeal swab, presence of viral mutation(s) within the areas targeted by this assay, and inadequate number of viral copies(<138 copies/mL). A negative result must be combined with clinical observations, patient history, and epidemiological information. The expected result is Negative.  Fact Sheet for Patients:  EntrepreneurPulse.com.au  Fact Sheet for Healthcare Providers:  IncredibleEmployment.be  This test is no t yet approved or cleared by the [place] FDA and  has been authorized for detection and/or diagnosis of SARS-CoV-2 by FDA under an Emergency Use Authorization (EUA). This EUA will remain  in effect (meaning this test can be used) for the duration of the COVID-19 declaration under Section 564(b)(1) of the Act, 21 U.S.C.section 360bbb-3(b)(1), unless the authorization is terminated  or revoked sooner.       Influenza A by PCR NEGATIVE NEGATIVE Final   Influenza B by PCR NEGATIVE NEGATIVE Final    Comment: (NOTE) The Xpert Xpress SARS-CoV-2/FLU/RSV plus assay is intended as an aid in the diagnosis of influenza from Nasopharyngeal swab specimens and should not be  used as a sole basis for treatment. Nasal washings and aspirates are unacceptable for Xpert Xpress SARS-CoV-2/FLU/RSV testing.  Fact Sheet for Patients: EntrepreneurPulse.com.au  Fact Sheet for Healthcare Providers: IncredibleEmployment.be  This test is not yet approved or cleared by the  [place] FDA and has been authorized for detection and/or diagnosis of SARS-CoV-2 by FDA under an Emergency Use Authorization (EUA). This EUA will remain in effect (meaning this test can be used) for the duration of the COVID-19 declaration under Section 564(b)(1) of the Act, 21 U.S.C. section 360bbb-3(b)(1), unless the authorization is terminated or revoked.  Performed at [facility], [place] [address], [place], [place] [address]     Please note: You were cared for by a hospitalist during your hospital stay. Once you are discharged, your primary care physician will handle any further medical issues. Please note that NO REFILLS for any discharge medications will be authorized once you are discharged, as it is imperative that you return to your primary care physician (or establish a relationship with a primary care physician if you do not have one) for your post hospital discharge needs so that they can reassess your need for medications and monitor your lab values.    Time coordinating discharge: 40 minutes  SIGNED:   Shelly Coss, MD  [facility] 02/15/2021, 2:16 PM Pager [phone]  If 7PM-7AM, please contact night-coverage [email or web address] Password [email or web address]

## 2021-02-12 NOTE — Evaluation (Signed)
Speech Language Pathology Evaluation Patient Details Name: Lindsay Martinez MRN: [id number] DOB: June 05, 1931 Today's Date: 02/12/2021 Time: [id number] SLP Time Calculation (min) (ACUTE ONLY): 23 min  Problem List:  Patient Active Problem List   Diagnosis Date Noted   Acute renal failure superimposed on stage 3a chronic kidney disease ([place]) 02/09/2021   Elevated creatine kinase 02/09/2021   Elevated troponin level not due myocardial infarction 02/09/2021   Mixed diabetic hyperlipidemia associated with type 2 diabetes mellitus ([place]) 02/09/2021   Acute stroke due to ischemia ([place]) 02/08/2021   Overactive bladder 10/22/2020   Essential hypertension 10/22/2020   HLD (hyperlipidemia) 10/22/2020   Depression 10/22/2020   GERD without esophagitis 10/22/2020   Type 2 diabetes mellitus with stage 3a chronic kidney disease, without long-term current use of insulin ([place]) 10/22/2020   Past Medical History:  Past Medical History:  Diagnosis Date   Essential hypertension 10/22/2020   GERD without esophagitis 10/22/2020   Memory impairment    Mixed diabetic hyperlipidemia associated with type 2 diabetes mellitus ([place]) 02/09/2021   Sepsis due to urinary tract infection ([place]) 10/22/2020   Type 2 diabetes mellitus with stage 3a chronic kidney disease, without long-term current use of insulin ([place]) 10/22/2020   Past Surgical History:  Past Surgical History:  Procedure Laterality Date   LAPAROSCOPIC GASTRIC BANDING  2017   HPI:  87 year old female with past medical history of diabetes mellitus type 2, hypertension, dementia, hyperlipidemia, gastroesophageal reflux disease, chronic kidney disease stage IIIa, obesity who presented to [facility] emergency department via EMS on 02/09/21 due to concerns for confusion.    Patient was an extremely poor historian due to lethargy and expressive aphasia and therefore the majority the history obtained from the daughter via phone conversation. MRI brain on  02/09/21 indicated Patchy small volume acute to early subacute ischemic infarcts involving the high left frontal and parietal lobes as well as the right parietal lobe as above. No associated hemorrhage or mass effect.  2. Underlying mild chronic microvascular ischemic disease; SLE generated  Assessment / Plan / Recommendation Clinical Impression  Pt administered the SLUMS (Suarez Mental Status Examination) with the following score obtained: 21/30 with 27/30 being a normal score for this assessment.  Deficits noted within attention with simple calculation tasks and clock formation.  Memory tasks requiring recall of objects/repetition were difficult for pt to complete with 1/5 objects recalled after a time constraint without category cues (4/5 recalled with cues provided).  Pt had difficulty with organization as she could only name 3 animals with perseveration noted throughout with word fluency task.  Mild expressive aphasia present with various naming (convergent/divergent) tasks, but confrontational naming 90% accurate with simple objects within environment.  Speech noted to be fully intelligible within simple conversation.  Pt would benefit from ST in acute setting to focus on above cognitive/linguistic goals.  Thank you for this consultation.      SLP Assessment  SLP Recommendation/Assessment: Patient needs continued Speech Language Pathology Services SLP Visit Diagnosis: Aphasia (R47.01);Attention and concentration deficit;Cognitive communication deficit (R41.841) Attention and concentration deficit following: Cerebral infarction    Follow Up Recommendations  Skilled Nursing facility vs CIR (depending on other tx needs)   Frequency and Duration min 1 x/week  1 week      SLP Evaluation Cognition  Overall Cognitive Status: No family/caregiver present to determine baseline cognitive functioning Arousal/Alertness: Awake/alert Orientation Level: Oriented X4 Attention:  Sustained Sustained Attention: Impaired Sustained Attention Impairment: Verbal basic;Functional basic Memory: Impaired Memory Impairment:  Retrieval deficit;Decreased recall of new information;Decreased short term memory Decreased Short Term Memory: Verbal basic;Functional basic Immediate Memory Recall: Sock;Blue;Bed Memory Recall Sock: Without Cue Memory Recall Blue: With Cue Memory Recall Bed: With Cue Awareness: Appears intact Behaviors: Perseveration Safety/Judgment: Appears intact       Comprehension  Auditory Comprehension Overall Auditory Comprehension: Appears within functional limits for tasks assessed Yes/No Questions: Within Functional Limits Commands: Within Functional Limits Conversation: Simple Interfering Components: Working Marine scientist;Attention EffectiveTechniques: Repetition;Extra processing time Visual Recognition/Discrimination Discrimination: Not tested Reading Comprehension Reading Status: Not tested    Expression Expression Primary Mode of Expression: Verbal Verbal Expression Overall Verbal Expression: Impaired Level of Generative/Spontaneous Verbalization: Sentence Repetition: No impairment Naming: Impairment Responsive: 76-100% accurate Confrontation: Not tested Convergent: 25-49% accurate Divergent: 25-49% accurate Verbal Errors: Perseveration Pragmatics: No impairment Interfering Components: Attention Non-Verbal Means of Communication: Not applicable Written Expression Dominant Hand: Right Written Expression: Not tested   Oral / Motor  Oral Motor/Sensory Function Overall Oral Motor/Sensory Function: Within functional limits Motor Speech Overall Motor Speech: Appears within functional limits for tasks assessed Respiration: Within functional limits Phonation: Normal Resonance: Within functional limits Articulation: Within functional limitis Intelligibility: Intelligible Motor Planning: Witnin functional limits Motor Speech Errors: Not  applicable                       Elvina Sidle, M.S., CCC-SLP 02/12/2021, 12:02 PM

## 2021-02-12 NOTE — Care Management Important Message (Signed)
Important Message  Patient Details  Name: Lindsay Martinez MRN: [id number] Date of Birth: January 07, 1931   Medicare Important Message Given:  Yes     [name] [address] 02/12/2021, 2:11 PM

## 2021-02-12 NOTE — TOC Progression Note (Addendum)
Transition of Care [facility]) - Progression Note    Patient Details  Name: TAMANA WISSMANN MRN: [id number] Date of Birth: 1931-03-07  Transition of Care [facility]) CM/SW [place], RN Phone Number:[phone]  02/12/2021, 1:50 PM  Clinical Narrative:    Patient has discharge order entered. CM has reached out to [facility]  to determine if facility has a bed available. Message has been left for Deirdre Pippins. Insurance Josem Kaufmann has been initiated.    1530 CM spoke with Janie at [facility]. Per Narda Rutherford the facility can accept patient tomorrow. Daughter is ok with accepting bed offer at [facility]. MD and bedside nurse messaged for covid test.    Expected Discharge Plan: Skilled Nursing Facility Barriers to Discharge: Insurance Authorization, SNF Pending bed offer  Expected Discharge Plan and Services Expected Discharge Plan: [place] Choice: [place] arrangements for the past 2 months: Apartment Expected Discharge Date: 02/12/21                                     Social Determinants of Health (SDOH) Interventions    Readmission Risk Interventions Readmission Risk Prevention Plan 10/25/2020  Post Dischage Appt Complete  Medication Screening Complete  Transportation Screening Complete

## 2021-02-12 NOTE — Progress Notes (Signed)
Physical Therapy Treatment Patient Details Name: Lindsay Martinez MRN: [id number] DOB: 30-Dec-1930 Today's Date: 02/12/2021    History of Present Illness 87 year old female who presents with confusion. 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 No tPA given.  PMH: diabetes mellitus type 2, hypertension, dementia, hyperlipidemia, gastroesophageal reflux disease, chronic kidney disease stage IIIa, obesity.    PT Comments    Today's skilled session continued to focus on mobility progression. Pt is at a min guard to min assist level for most mobility, did need mod assist for sitting up this session, however needs constant cues for safety with mobility. Pt remains appropriate for SNF to maximize safety and mobility prior to returning home. Acute PT to continue during pt's hospital stay.    Follow Up Recommendations  SNF;Supervision/Assistance - 24 hour     Equipment Recommendations  None recommended by PT    Precautions / Restrictions Precautions Precautions: Fall Precaution Comments: Incontinent of urine Restrictions Weight Bearing Restrictions: No    Mobility  Bed Mobility Overal bed mobility: Needs Assistance Bed Mobility: Supine to Sit;Sit to Supine     Supine to sit: Mod assist Sit to supine: Min assist   General bed mobility comments: mod assist for trunk elevation into sitting at edge of bed with rail and HOB 20 degrees. cues needed for sequencing and technique. after gait pt needed min assist to clear bed surface with return to lying down on right side.    Transfers Overall transfer level: Needs assistance Equipment used: Rolling walker (2 wheeled) Transfers: Sit to/from Stand Sit to Stand: Min assist;Min guard         General transfer comment: min guard assist from elevated bed, min assist from low toilet. cues both times for safety, hand placement and weight shifting.  Ambulation/Gait   Gait Distance (Feet): 30 Feet (limited to in room due  to pt needing to use bathroom) Assistive device: Rolling walker (2 wheeled) Gait Pattern/deviations: Step-through pattern;Decreased stride length Gait velocity: decreased   General Gait Details: pt needed cues to stay with RW as she kept trying to walk away from it, directional cues for navigation of room and bathroom with RW. cues for posture as well. no balance loss noted with RW use.       Modified Rankin (Stroke Patients Only) Modified Rankin (Stroke Patients Only) Pre-Morbid Rankin Score: No symptoms Modified Rankin: Moderately severe disability     Balance   Sitting-balance support: Feet supported Sitting balance-Leahy Scale: Fair Sitting balance - Comments: min guard for EOB for safety   Standing balance support: Bilateral upper extremity supported Standing balance-Leahy Scale: Poor Standing balance comment: reliant on RW and external cues/assist. min guard assist with single UE support in bathroom while pt performed self pericare                Cognition Arousal/Alertness: Awake/alert Behavior During Therapy: WFL for tasks assessed/performed Overall Cognitive Status: No family/caregiver present to determine baseline cognitive functioning Area of Impairment: Orientation;Memory;Following commands;Safety/judgement;Awareness;Problem solving                 Orientation Level: Time;Place;Disoriented to (able to state in "hospital" for "stroke. unable to state name of hospital.)   Memory: Decreased short-term memory Following Commands: Follows one step commands with increased time Safety/Judgement: Decreased awareness of safety;Decreased awareness of deficits Awareness: Intellectual   General Comments: pt needed directional cues for all tasks performed, cues for sequencing of tasks as well       Pertinent Vitals/Pain Pain Assessment:  No/denies pain     PT Goals (current goals can now be found in the care plan section) Acute Rehab PT Goals Patient Stated  Goal: Patient unsure PT Goal Formulation: With patient Time For Goal Achievement: 02/24/21 Potential to Achieve Goals: Good Progress towards PT goals: Progressing toward goals    Frequency    Min 3X/week      PT Plan Current plan remains appropriate    AM-PAC PT "6 Clicks" Mobility   Outcome Measure  Help needed turning from your back to your side while in a flat bed without using bedrails?: A Lot Help needed moving from lying on your back to sitting on the side of a flat bed without using bedrails?: A Little Help needed moving to and from a bed to a chair (including a wheelchair)?: A Little Help needed standing up from a chair using your arms (e.g., wheelchair or bedside chair)?: A Little Help needed to walk in hospital room?: A Little Help needed climbing 3-5 steps with a railing? : A Lot 6 Click Score: 16    End of Session Equipment Utilized During Treatment: Gait belt Activity Tolerance: Patient tolerated treatment well Patient left: in bed;with call bell/phone within reach;with bed alarm set;Other (comment) (with ST at bedside to start speech session) Nurse Communication: Mobility status;Other (comment) (need for purewick to be replaced) PT Visit Diagnosis: Unsteadiness on feet (R26.81);Muscle weakness (generalized) (M62.81);Difficulty in walking, not elsewhere classified (R26.2)     Time: [id number] PT Time Calculation (min) (ACUTE ONLY): 26 min  Charges:  $Gait Training: 8-22 mins $Therapeutic Activity: 8-22 mins                     Willow Ora, PTA, [facility] Acute [employer] Office- [phone] 02/12/21, 11:44 AM   Willow Ora 02/12/2021, 11:44 AM

## 2021-02-12 NOTE — Progress Notes (Signed)
PHARMACY - PHYSICIAN COMMUNICATION CRITICAL VALUE ALERT - BLOOD CULTURE IDENTIFICATION (BCID)  Lindsay Martinez is an 87 y.o. female who presented to [facility] on 02/08/2021 with a chief complaint of altered mental status.   Assessment:   1/4 GPC in clusters BCID detected 1/4 Staphylococcus Epidermidis, mecA detected Likely represents a contaminant   Name of physician (or Provider) Contacted: Shelly Coss MD  Current antibiotics:  None  Changes to prescribed antibiotics recommended:  Staph Epi likely representing a contaminant. No antibiotics indicated.   Results for orders placed or performed during the hospital encounter of 02/08/21  Blood Culture ID Panel (Reflexed) (Collected: 02/09/2021  5:02 AM)  Result Value Ref Range   Enterococcus faecalis NOT DETECTED NOT DETECTED   Enterococcus Faecium NOT DETECTED NOT DETECTED   Listeria monocytogenes NOT DETECTED NOT DETECTED   Staphylococcus species DETECTED (A) NOT DETECTED   Staphylococcus aureus (BCID) NOT DETECTED NOT DETECTED   Staphylococcus epidermidis DETECTED (A) NOT DETECTED   Staphylococcus lugdunensis NOT DETECTED NOT DETECTED   Streptococcus species NOT DETECTED NOT DETECTED   Streptococcus agalactiae NOT DETECTED NOT DETECTED   Streptococcus pneumoniae NOT DETECTED NOT DETECTED   Streptococcus pyogenes NOT DETECTED NOT DETECTED   A.calcoaceticus-baumannii NOT DETECTED NOT DETECTED   Bacteroides fragilis NOT DETECTED NOT DETECTED   Enterobacterales NOT DETECTED NOT DETECTED   Enterobacter cloacae complex NOT DETECTED NOT DETECTED   Escherichia coli NOT DETECTED NOT DETECTED   Klebsiella aerogenes NOT DETECTED NOT DETECTED   Klebsiella oxytoca NOT DETECTED NOT DETECTED   Klebsiella pneumoniae NOT DETECTED NOT DETECTED   Proteus species NOT DETECTED NOT DETECTED   Salmonella species NOT DETECTED NOT DETECTED   Serratia marcescens NOT DETECTED NOT DETECTED   Haemophilus influenzae NOT DETECTED NOT DETECTED    Neisseria meningitidis NOT DETECTED NOT DETECTED   Pseudomonas aeruginosa NOT DETECTED NOT DETECTED   Stenotrophomonas maltophilia NOT DETECTED NOT DETECTED   Candida albicans NOT DETECTED NOT DETECTED   Candida auris NOT DETECTED NOT DETECTED   Candida glabrata NOT DETECTED NOT DETECTED   Candida krusei NOT DETECTED NOT DETECTED   Candida parapsilosis NOT DETECTED NOT DETECTED   Candida tropicalis NOT DETECTED NOT DETECTED   Cryptococcus neoformans/gattii NOT DETECTED NOT DETECTED   Methicillin resistance mecA/C DETECTED (A) NOT DETECTED   Lestine Box, PharmD PGY2 Infectious Diseases Pharmacy Resident   Please check [email or web address] for unit-specific pharmacy phone numbers

## 2021-02-12 NOTE — Progress Notes (Signed)
   02/11/21 2333  Assess: MEWS Score  Temp 98.5 F (36.9 C)  BP (!) 203/85  Pulse Rate 73  ECG Heart Rate 73  Resp 19  Level of Consciousness Alert  SpO2 96 %  O2 Device Room Air  Patient Activity (if Appropriate) In bed  Assess: MEWS Score  MEWS Temp 0  MEWS Systolic 2  MEWS Pulse 0  MEWS RR 0  MEWS LOC 0  MEWS Score 2  MEWS Score Color Yellow  Assess: if the MEWS score is Yellow or Red  Were vital signs taken at a resting state? Yes  Focused Assessment No change from prior assessment  Early Detection of Sepsis Score *See Row Information* Low  MEWS guidelines implemented *See Row Information* No, vital signs rechecked  Treat  MEWS Interventions Escalated (See documentation below);Administered prn meds/treatments;Administered scheduled meds/treatments  Pain Scale 0-10  Pain Score 0  Notify: Provider  Provider Madelaine Bhat, MD  Date Provider Notified 02/11/21  Time Provider Notified 2359  Notification Type  (Secure chat)  Notification Reason Other (Comment) (Hypertension)  Provider response See new orders  Date of Provider Response 02/12/21  Time of Provider Response 0000  Document  Patient Outcome Stabilized after interventions   Patient resting in bed, asymptomatic. Hydralazine IV given per PRN orders. Amlodipine PO given x1 per new order from [place], MD. Repeat BP 180/61. Will continue to monitor.

## 2021-02-13 ENCOUNTER — Other Ambulatory Visit: Payer: Self-pay | Admitting: Cardiology

## 2021-02-13 DIAGNOSIS — E785 Hyperlipidemia, unspecified: Secondary | ICD-10-CM | POA: Diagnosis not present

## 2021-02-13 DIAGNOSIS — Z7401 Bed confinement status: Secondary | ICD-10-CM | POA: Diagnosis not present

## 2021-02-13 DIAGNOSIS — R279 Unspecified lack of coordination: Secondary | ICD-10-CM | POA: Diagnosis not present

## 2021-02-13 DIAGNOSIS — I639 Cerebral infarction, unspecified: Secondary | ICD-10-CM

## 2021-02-13 DIAGNOSIS — K219 Gastro-esophageal reflux disease without esophagitis: Secondary | ICD-10-CM | POA: Diagnosis not present

## 2021-02-13 DIAGNOSIS — R404 Transient alteration of awareness: Secondary | ICD-10-CM | POA: Diagnosis not present

## 2021-02-13 DIAGNOSIS — R2689 Other abnormalities of gait and mobility: Secondary | ICD-10-CM | POA: Diagnosis not present

## 2021-02-13 DIAGNOSIS — R269 Unspecified abnormalities of gait and mobility: Secondary | ICD-10-CM | POA: Diagnosis not present

## 2021-02-13 DIAGNOSIS — R262 Difficulty in walking, not elsewhere classified: Secondary | ICD-10-CM | POA: Diagnosis not present

## 2021-02-13 DIAGNOSIS — Z741 Need for assistance with personal care: Secondary | ICD-10-CM | POA: Diagnosis not present

## 2021-02-13 DIAGNOSIS — E559 Vitamin D deficiency, unspecified: Secondary | ICD-10-CM | POA: Diagnosis not present

## 2021-02-13 DIAGNOSIS — R531 Weakness: Secondary | ICD-10-CM | POA: Diagnosis not present

## 2021-02-13 DIAGNOSIS — M6281 Muscle weakness (generalized): Secondary | ICD-10-CM | POA: Diagnosis not present

## 2021-02-13 DIAGNOSIS — I10 Essential (primary) hypertension: Secondary | ICD-10-CM | POA: Diagnosis not present

## 2021-02-13 DIAGNOSIS — M6259 Muscle wasting and atrophy, not elsewhere classified, multiple sites: Secondary | ICD-10-CM | POA: Diagnosis not present

## 2021-02-13 DIAGNOSIS — R2681 Unsteadiness on feet: Secondary | ICD-10-CM | POA: Diagnosis not present

## 2021-02-13 DIAGNOSIS — Z743 Need for continuous supervision: Secondary | ICD-10-CM | POA: Diagnosis not present

## 2021-02-13 DIAGNOSIS — N179 Acute kidney failure, unspecified: Secondary | ICD-10-CM | POA: Diagnosis not present

## 2021-02-13 DIAGNOSIS — N3281 Overactive bladder: Secondary | ICD-10-CM | POA: Diagnosis not present

## 2021-02-13 DIAGNOSIS — K649 Unspecified hemorrhoids: Secondary | ICD-10-CM | POA: Diagnosis not present

## 2021-02-13 DIAGNOSIS — E1122 Type 2 diabetes mellitus with diabetic chronic kidney disease: Secondary | ICD-10-CM | POA: Diagnosis not present

## 2021-02-13 DIAGNOSIS — R4701 Aphasia: Secondary | ICD-10-CM | POA: Diagnosis not present

## 2021-02-13 DIAGNOSIS — G4701 Insomnia due to medical condition: Secondary | ICD-10-CM | POA: Diagnosis not present

## 2021-02-13 DIAGNOSIS — F32A Depression, unspecified: Secondary | ICD-10-CM | POA: Diagnosis not present

## 2021-02-13 DIAGNOSIS — R6889 Other general symptoms and signs: Secondary | ICD-10-CM | POA: Diagnosis not present

## 2021-02-13 DIAGNOSIS — N1831 Chronic kidney disease, stage 3a: Secondary | ICD-10-CM | POA: Diagnosis not present

## 2021-02-13 DIAGNOSIS — G479 Sleep disorder, unspecified: Secondary | ICD-10-CM | POA: Diagnosis not present

## 2021-02-13 DIAGNOSIS — E119 Type 2 diabetes mellitus without complications: Secondary | ICD-10-CM | POA: Diagnosis not present

## 2021-02-13 LAB — RESP PANEL BY RT-PCR (FLU A&B, COVID) ARPGX2
Influenza A by PCR: NEGATIVE
Influenza B by PCR: NEGATIVE
SARS Coronavirus 2 by RT PCR: NEGATIVE

## 2021-02-13 LAB — GLUCOSE, CAPILLARY
Glucose-Capillary: 110 mg/dL — ABNORMAL HIGH (ref 70–99)
Glucose-Capillary: 117 mg/dL — ABNORMAL HIGH (ref 70–99)

## 2021-02-13 NOTE — Progress Notes (Signed)
Attempted to call report to [facility].  Nurse didn't answer phone but receptionist reports she will have the nurse call me.  Informed her that transport would be here anytime.

## 2021-02-13 NOTE — Progress Notes (Signed)
Patient seen and examined at the bedside this morning.  Comfortable.  She was working with occupational therapy.  No new change in the medical management.  Stable for discharge today.  Discharge summary and orders are in.

## 2021-02-13 NOTE — Progress Notes (Signed)
Kim from [facility] returned call for transfer report.  Awaiting transport.

## 2021-02-13 NOTE — Progress Notes (Signed)
Occupational Therapy Treatment Patient Details Name: Lindsay Martinez MRN: [id number] DOB: 1930-11-25 Today's Date: 02/13/2021    History of present illness 87 year old female who presents with confusion. 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 No tPA given.  PMH: diabetes mellitus type 2, hypertension, dementia, hyperlipidemia, gastroesophageal reflux disease, chronic kidney disease stage IIIa, obesity.   OT comments  Pt presented in bed. Pt takes increase time to motor plan and sequence with tasks in session and requires step by step instructions. Pt was able to complete bed mobility with supervision and bed rail, sit to stand to complete hygiene tasks with min guard for transfer and moderate assist for hygiene. Pt noted when going from sitting to supine required directional cue of top to bottom of bed to determine on how to lay down. Pt currently with functional limitations due to the deficits listed below (see OT Problem List).  Pt will benefit from skilled OT to increase their safety and independence with ADL and functional mobility for ADL to facilitate discharge to venue listed below.    Follow Up Recommendations  SNF;Supervision/Assistance - 24 hour    Equipment Recommendations  3 in 1 bedside commode    Recommendations for Other Services      Precautions / Restrictions Precautions Precautions: Fall Precaution Comments: Incontinent of urine Restrictions Weight Bearing Restrictions: No       Mobility Bed Mobility Overal bed mobility: Needs Assistance Bed Mobility: Supine to Sit;Sit to Supine     Supine to sit: Min guard Sit to supine: Min assist   General bed mobility comments: Pt takes increase time and decrease in level of awareness on where the top to the bottom of the bed    Transfers Overall transfer level: Needs assistance Equipment used: Rolling walker (2 wheeled) Transfers: Sit to/from Stand Sit to Stand: Min guard Stand pivot  transfers: Min guard       General transfer comment: pt requires max assist to sequence    Balance Overall balance assessment: Needs assistance Sitting-balance support: Feet supported Sitting balance-Leahy Scale: Fair Sitting balance - Comments: min guard for EOB for safety                                   ADL either performed or assessed with clinical judgement   ADL Overall ADL's : Needs assistance/impaired Eating/Feeding: Set up;Sitting Eating/Feeding Details (indicate cue type and reason): pt was sleeping with a knive still in there hand but was aware onced cued Grooming: Wash/dry hands;Set up;Sitting;Cueing for sequencing   Upper Body Bathing: Minimal assistance;Cueing for safety;Cueing for sequencing;Sitting   Lower Body Bathing: Moderate assistance;Cueing for safety;Cueing for sequencing;Sit to/from stand   Upper Body Dressing : Minimal assistance;Cueing for safety;Cueing for sequencing;Sitting   Lower Body Dressing: Cueing for safety;Cueing for sequencing;Sit to/from stand   Toilet Transfer: Minimal assistance;Cueing for sequencing;Ambulation;BSC   Toileting- Clothing Manipulation and Hygiene: Moderate assistance;Cueing for safety;Cueing for sequencing;Sit to/from stand       Functional mobility during ADLs: Minimal assistance;Rolling walker General ADL Comments: difficulty with comprehension, which impacts mobility     Vision       Perception     Praxis      Cognition Arousal/Alertness: Awake/alert Behavior During Therapy: WFL for tasks assessed/performed Overall Cognitive Status: No family/caregiver present to determine baseline cognitive functioning Area of Impairment: Orientation;Memory;Following commands;Safety/judgement;Awareness;Problem solving  Orientation Level: Time;Place;Disoriented to   Memory: Decreased short-term memory Following Commands: Follows one step commands with increased time Safety/Judgement:  Decreased awareness of safety;Decreased awareness of deficits Awareness: Intellectual Problem Solving: Difficulty sequencing;Requires verbal cues;Requires tactile cues;Slow processing General Comments: pt needed directional cues for all tasks performed, cues for sequencing of tasks as well        Exercises     Shoulder Instructions       General Comments      Pertinent Vitals/ Pain       Pain Assessment: No/denies pain  Home Living                                          Prior Functioning/Environment              Frequency  Min 2X/week        Progress Toward Goals  OT Goals(current goals can now be found in the care plan section)  Progress towards OT goals: Progressing toward goals  Acute Rehab OT Goals Patient Stated Goal: Pt could not report OT Goal Formulation: Patient unable to participate in goal setting Time For Goal Achievement: 02/23/21 Potential to Achieve Goals: Good  Plan Discharge plan remains appropriate    Co-evaluation                 AM-PAC OT "6 Clicks" Daily Activity     Outcome Measure   Help from another person eating meals?: None Help from another person taking care of personal grooming?: None Help from another person toileting, which includes using toliet, bedpan, or urinal?: A Little Help from another person bathing (including washing, rinsing, drying)?: A Little Help from another person to put on and taking off regular upper body clothing?: A Little Help from another person to put on and taking off regular lower body clothing?: A Little 6 Click Score: 20    End of Session Equipment Utilized During Treatment: Rolling walker  OT Visit Diagnosis: Unsteadiness on feet (R26.81);Other symptoms and signs involving cognitive function   Activity Tolerance Patient tolerated treatment well   Patient Left in bed;with call bell/phone within reach;with bed alarm set   Nurse Communication          Time:  [phone] OT Time Calculation (min): 26 min  Charges: OT General Charges $OT Visit: 1 Visit OT Treatments $Self Care/Home Management : 23-37 mins  potassium     Joeseph Amor 02/13/2021, 9:16 AM

## 2021-02-13 NOTE — TOC Progression Note (Signed)
Transition of Care [facility]) - Progression Note    Patient Details  Name: Lindsay Martinez MRN: [id number] Date of Birth: 01-08-1931  Transition of [facility]) CM/SW Cos Cob, RN Phone Number:[phone]  02/13/2021, 9:58 AM  Clinical Narrative:    Patient to discharge to [facility]. Transport via [facility] has been scheduled for 2pm per [place]'s request. Daughter [name] notified. No other needs noted at this time. TOC will sign off.   Please call report to  [place] [phone]     Expected Discharge Plan: [place] Barriers to Discharge: No Barriers Identified  Expected Discharge Plan and Services Expected Discharge Plan: [place]: [place] Living arrangements for the past 2 months: Apartment Expected Discharge Date: 02/12/21               DME Arranged: N/A DME Agency: NA       HH Arranged: NA HH Agency: NA         Social Determinants of Health (SDOH) Interventions    Readmission Risk Interventions Readmission Risk Prevention Plan 10/25/2020  Post Dischage Appt Complete  Medication Screening Complete  Transportation Screening Complete

## 2021-02-14 ENCOUNTER — Encounter: Payer: Self-pay | Admitting: *Deleted

## 2021-02-14 DIAGNOSIS — I10 Essential (primary) hypertension: Secondary | ICD-10-CM | POA: Diagnosis not present

## 2021-02-14 DIAGNOSIS — G479 Sleep disorder, unspecified: Secondary | ICD-10-CM | POA: Diagnosis not present

## 2021-02-14 DIAGNOSIS — E119 Type 2 diabetes mellitus without complications: Secondary | ICD-10-CM | POA: Diagnosis not present

## 2021-02-14 DIAGNOSIS — N179 Acute kidney failure, unspecified: Secondary | ICD-10-CM | POA: Diagnosis not present

## 2021-02-14 DIAGNOSIS — I639 Cerebral infarction, unspecified: Secondary | ICD-10-CM | POA: Diagnosis not present

## 2021-02-14 DIAGNOSIS — N1831 Chronic kidney disease, stage 3a: Secondary | ICD-10-CM | POA: Diagnosis not present

## 2021-02-14 DIAGNOSIS — N3281 Overactive bladder: Secondary | ICD-10-CM | POA: Diagnosis not present

## 2021-02-14 DIAGNOSIS — F32A Depression, unspecified: Secondary | ICD-10-CM | POA: Diagnosis not present

## 2021-02-14 DIAGNOSIS — E785 Hyperlipidemia, unspecified: Secondary | ICD-10-CM | POA: Diagnosis not present

## 2021-02-14 DIAGNOSIS — K219 Gastro-esophageal reflux disease without esophagitis: Secondary | ICD-10-CM | POA: Diagnosis not present

## 2021-02-14 LAB — CULTURE, BLOOD (ROUTINE X 2)

## 2021-02-14 NOTE — Progress Notes (Signed)
Patient ID: Lindsay Martinez, female   DOB: [birth year], 87 y.o.   MRN: [id number] Patient enrolled for [employer] to ship a 30 day cardiac event monitor to: [place] in care of [facility] [address] [place], [place]  [address] Attn: Charge Nurse [phone] [phone]  Letter with instructions mailed to same address.

## 2021-02-16 DIAGNOSIS — E1122 Type 2 diabetes mellitus with diabetic chronic kidney disease: Secondary | ICD-10-CM | POA: Diagnosis not present

## 2021-02-16 DIAGNOSIS — R269 Unspecified abnormalities of gait and mobility: Secondary | ICD-10-CM | POA: Diagnosis not present

## 2021-02-16 DIAGNOSIS — I639 Cerebral infarction, unspecified: Secondary | ICD-10-CM | POA: Diagnosis not present

## 2021-02-21 DIAGNOSIS — N3281 Overactive bladder: Secondary | ICD-10-CM | POA: Diagnosis not present

## 2021-02-21 DIAGNOSIS — I10 Essential (primary) hypertension: Secondary | ICD-10-CM | POA: Diagnosis not present

## 2021-02-21 DIAGNOSIS — K649 Unspecified hemorrhoids: Secondary | ICD-10-CM | POA: Diagnosis not present

## 2021-02-26 DIAGNOSIS — I639 Cerebral infarction, unspecified: Secondary | ICD-10-CM | POA: Diagnosis not present

## 2021-02-26 DIAGNOSIS — K649 Unspecified hemorrhoids: Secondary | ICD-10-CM | POA: Diagnosis not present

## 2021-02-26 DIAGNOSIS — R531 Weakness: Secondary | ICD-10-CM | POA: Diagnosis not present

## 2021-02-26 DIAGNOSIS — F32A Depression, unspecified: Secondary | ICD-10-CM | POA: Diagnosis not present

## 2021-02-26 DIAGNOSIS — N3281 Overactive bladder: Secondary | ICD-10-CM | POA: Diagnosis not present

## 2021-02-28 DIAGNOSIS — I10 Essential (primary) hypertension: Secondary | ICD-10-CM | POA: Diagnosis not present

## 2021-02-28 DIAGNOSIS — K219 Gastro-esophageal reflux disease without esophagitis: Secondary | ICD-10-CM | POA: Diagnosis not present

## 2021-02-28 DIAGNOSIS — F5101 Primary insomnia: Secondary | ICD-10-CM | POA: Diagnosis not present

## 2021-02-28 DIAGNOSIS — I69320 Aphasia following cerebral infarction: Secondary | ICD-10-CM | POA: Diagnosis not present

## 2021-02-28 DIAGNOSIS — Z09 Encounter for follow-up examination after completed treatment for conditions other than malignant neoplasm: Secondary | ICD-10-CM | POA: Diagnosis not present

## 2021-02-28 DIAGNOSIS — N1832 Chronic kidney disease, stage 3b: Secondary | ICD-10-CM | POA: Diagnosis not present

## 2021-02-28 DIAGNOSIS — Z8673 Personal history of transient ischemic attack (TIA), and cerebral infarction without residual deficits: Secondary | ICD-10-CM | POA: Diagnosis not present

## 2021-02-28 DIAGNOSIS — E782 Mixed hyperlipidemia: Secondary | ICD-10-CM | POA: Diagnosis not present

## 2021-02-28 DIAGNOSIS — N3281 Overactive bladder: Secondary | ICD-10-CM | POA: Diagnosis not present

## 2021-03-08 DIAGNOSIS — Z8616 Personal history of COVID-19: Secondary | ICD-10-CM | POA: Diagnosis not present

## 2021-03-21 ENCOUNTER — Other Ambulatory Visit: Payer: Self-pay

## 2021-03-21 ENCOUNTER — Emergency Department (HOSPITAL_COMMUNITY): Payer: Medicare Other

## 2021-03-21 ENCOUNTER — Emergency Department (HOSPITAL_COMMUNITY)
Admission: EM | Admit: 2021-03-21 | Discharge: 2021-03-22 | Disposition: A | Discharge status: Home / Self Care | Payer: Medicare Other | Attending: Emergency Medicine | Admitting: Emergency Medicine

## 2021-03-21 DIAGNOSIS — W19XXXA Unspecified fall, initial encounter: Secondary | ICD-10-CM

## 2021-03-21 DIAGNOSIS — S0101XA Laceration without foreign body of scalp, initial encounter: Secondary | ICD-10-CM

## 2021-03-21 DIAGNOSIS — E860 Dehydration: Secondary | ICD-10-CM

## 2021-03-21 DIAGNOSIS — Z7902 Long term (current) use of antithrombotics/antiplatelets: Secondary | ICD-10-CM | POA: Insufficient documentation

## 2021-03-21 DIAGNOSIS — W01198A Fall on same level from slipping, tripping and stumbling with subsequent striking against other object, initial encounter: Secondary | ICD-10-CM | POA: Insufficient documentation

## 2021-03-21 DIAGNOSIS — Y9301 Activity, walking, marching and hiking: Secondary | ICD-10-CM | POA: Insufficient documentation

## 2021-03-21 DIAGNOSIS — S0003XA Contusion of scalp, initial encounter: Secondary | ICD-10-CM | POA: Diagnosis not present

## 2021-03-21 DIAGNOSIS — Z743 Need for continuous supervision: Secondary | ICD-10-CM | POA: Diagnosis not present

## 2021-03-21 DIAGNOSIS — R2681 Unsteadiness on feet: Secondary | ICD-10-CM | POA: Diagnosis not present

## 2021-03-21 DIAGNOSIS — R2689 Other abnormalities of gait and mobility: Secondary | ICD-10-CM | POA: Diagnosis not present

## 2021-03-21 DIAGNOSIS — I6789 Other cerebrovascular disease: Secondary | ICD-10-CM | POA: Diagnosis not present

## 2021-03-21 DIAGNOSIS — I499 Cardiac arrhythmia, unspecified: Secondary | ICD-10-CM | POA: Diagnosis not present

## 2021-03-21 DIAGNOSIS — R296 Repeated falls: Secondary | ICD-10-CM | POA: Diagnosis not present

## 2021-03-21 DIAGNOSIS — I10 Essential (primary) hypertension: Secondary | ICD-10-CM | POA: Diagnosis not present

## 2021-03-21 DIAGNOSIS — S0990XA Unspecified injury of head, initial encounter: Secondary | ICD-10-CM | POA: Diagnosis present

## 2021-03-21 DIAGNOSIS — S199XXA Unspecified injury of neck, initial encounter: Secondary | ICD-10-CM | POA: Diagnosis not present

## 2021-03-21 DIAGNOSIS — R404 Transient alteration of awareness: Secondary | ICD-10-CM | POA: Diagnosis not present

## 2021-03-21 DIAGNOSIS — R58 Hemorrhage, not elsewhere classified: Secondary | ICD-10-CM | POA: Diagnosis not present

## 2021-03-21 DIAGNOSIS — R6889 Other general symptoms and signs: Secondary | ICD-10-CM | POA: Diagnosis not present

## 2021-03-21 DIAGNOSIS — R1110 Vomiting, unspecified: Secondary | ICD-10-CM | POA: Diagnosis not present

## 2021-03-21 LAB — COMPREHENSIVE METABOLIC PANEL
ALT: 25 U/L (ref 0–44)
AST: 32 U/L (ref 15–41)
Albumin: 3.4 g/dL — ABNORMAL LOW (ref 3.5–5.0)
Alkaline Phosphatase: 62 U/L (ref 38–126)
Anion gap: 13 (ref 5–15)
BUN: 23 mg/dL (ref 8–23)
CO2: 17 mmol/L — ABNORMAL LOW (ref 22–32)
Calcium: 10 mg/dL (ref 8.9–10.3)
Chloride: 108 mmol/L (ref 98–111)
Creatinine, Ser: 1.35 mg/dL — ABNORMAL HIGH (ref 0.44–1.00)
GFR, Estimated: 38 mL/min — ABNORMAL LOW (ref >60)
Glucose, Bld: 123 mg/dL — ABNORMAL HIGH (ref 70–99)
Potassium: 3.9 mmol/L (ref 3.5–5.1)
Sodium: 138 mmol/L (ref 135–145)
Total Bilirubin: 0.8 mg/dL (ref 0.3–1.2)
Total Protein: 6.3 g/dL — ABNORMAL LOW (ref 6.5–8.1)

## 2021-03-21 LAB — CBC WITH DIFFERENTIAL/PLATELET
Abs Immature Granulocytes: 0.06 10*3/uL (ref 0.00–0.07)
Basophils Absolute: 0 10*3/uL (ref 0.0–0.1)
Basophils Relative: 0 %
Eosinophils Absolute: 0.1 10*3/uL (ref 0.0–0.5)
Eosinophils Relative: 1 %
HCT: 28.3 % — ABNORMAL LOW (ref 36.0–46.0)
Hemoglobin: 8.4 g/dL — ABNORMAL LOW (ref 12.0–15.0)
Immature Granulocytes: 1 %
Lymphocytes Relative: 9 %
Lymphs Abs: 0.9 10*3/uL (ref 0.7–4.0)
MCH: 25.1 pg — ABNORMAL LOW (ref 26.0–34.0)
MCHC: 29.7 g/dL — ABNORMAL LOW (ref 30.0–36.0)
MCV: 84.7 fL (ref 80.0–100.0)
Monocytes Absolute: 0.7 10*3/uL (ref 0.1–1.0)
Monocytes Relative: 7 %
Neutro Abs: 7.7 10*3/uL (ref 1.7–7.7)
Neutrophils Relative %: 82 %
Platelets: 394 10*3/uL (ref 150–400)
RBC: 3.34 MIL/uL — ABNORMAL LOW (ref 3.87–5.11)
RDW: 15.9 % — ABNORMAL HIGH (ref 11.5–15.5)
WBC: 9.5 10*3/uL (ref 4.0–10.5)
nRBC: 0.3 % — ABNORMAL HIGH (ref 0.0–0.2)

## 2021-03-21 LAB — I-STAT CHEM 8, ED
BUN: 24 mg/dL — ABNORMAL HIGH (ref 8–23)
Calcium, Ion: 1.24 mmol/L (ref 1.15–1.40)
Chloride: 110 mmol/L (ref 98–111)
Creatinine, Ser: 1.2 mg/dL — ABNORMAL HIGH (ref 0.44–1.00)
Glucose, Bld: 119 mg/dL — ABNORMAL HIGH (ref 70–99)
HCT: 27 % — ABNORMAL LOW (ref 36.0–46.0)
Hemoglobin: 9.2 g/dL — ABNORMAL LOW (ref 12.0–15.0)
Potassium: 3.8 mmol/L (ref 3.5–5.1)
Sodium: 139 mmol/L (ref 135–145)
TCO2: 19 mmol/L — ABNORMAL LOW (ref 22–32)

## 2021-03-21 LAB — URINALYSIS, ROUTINE W REFLEX MICROSCOPIC
Glucose, UA: NEGATIVE mg/dL
Hgb urine dipstick: NEGATIVE
Ketones, ur: >80 mg/dL — AB
Leukocytes,Ua: NEGATIVE
Nitrite: NEGATIVE
Protein, ur: NEGATIVE mg/dL
Specific Gravity, Urine: 1.015 (ref 1.005–1.030)
pH: 6 (ref 5.0–8.0)

## 2021-03-21 LAB — PROTIME-INR
INR: 1 (ref 0.8–1.2)
Prothrombin Time: 13.3 seconds (ref 11.4–15.2)

## 2021-03-21 MED ORDER — SODIUM CHLORIDE 0.9 % IV BOLUS
500.0000 mL | Freq: Once | INTRAVENOUS | Status: AC
  Administered 2021-03-21: 500 mL via INTRAVENOUS

## 2021-03-21 NOTE — ED Notes (Signed)
4th on ptar list

## 2021-03-21 NOTE — ED Triage Notes (Signed)
Pt bib [facility] after fall. Pt had two falls today. Daughter witnesseed the second fall, pt lost grip of walker and fell back, hit head on dresser. Was responsive immediately after fall. Small 1 inch lac to the back of the head. Pt is on plavix, history of stroke, slight aphasia noted at baseline. BP 156/76, CBG 180, HR 92, GCS 15 per EMS. 18G LAC PTA.

## 2021-03-21 NOTE — ED Notes (Signed)
Patient transported to CT 

## 2021-03-21 NOTE — Progress Notes (Signed)
Orthopedic Tech Progress Note Patient Details:  Lindsay Martinez 1930-10-18 [id number]  Patient ID: Lindsay Martinez, female   DOB: 12/11/1930, 87 y.o.   MRN: [id number]  [name] 03/21/2021, 6:01 PM Level 2 Trauma

## 2021-03-21 NOTE — ED Provider Notes (Signed)
[facility] EMERGENCY DEPARTMENT Provider Note   CSN: [id number] Arrival date & time: 03/21/21  1511     History Chief Complaint  Patient presents with   Lindsay Martinez is a 87 y.o. female.  87 year old female brought in by EMS with report of fall x2 on Plavix.  Patient is on Plavix for prior CVA, also reports questionable arrhythmia.  History not available at time otherwise.  Patient is ambulatory at baseline with a walker, states that she was walking with her walker today, fell twice hitting the back of her head on the hardwood floor, unsure why she fell, denies loss of consciousness.  Denies feeling weak, dizzy, lightheaded.  Complains of a headache.  No other injuries or concerns.  Bleeding controlled.  Patient's daughter [name] arrives at beside and is able to assist with history.  Patient's daughter states that she fell earlier today when getting into the shower, daughter did not witness that fall but arrived shortly afterwards.  Patient declined EMS transport at that time.  Patient then fell about an hour later, was walking with her walker when daughter states that she seemed to lose her balance and fell backwards, striking her head on the bookcase.  Daughter reports 1 episode of vomiting, was incontinent of stool (has been incontinent of stool for the past month).  No loss of consciousness, was alert and saying ABCs to the daughter until EMS arrived. Patient is at baseline mental status at this time.      OB History   No obstetric history on file.     No family history on file.     Home Medications Prior to Admission medications   Medication Sig Start Date End Date Taking? Authorizing Provider  amLODipine (NORVASC) 10 MG tablet Take 10 mg by mouth daily. 02/14/21   [provider]  atorvastatin (LIPITOR) 10 MG tablet Take 10 mg by mouth daily. 12/31/20   [provider]  clopidogrel (PLAVIX) 75 MG tablet Take 75 mg by mouth  daily. 03/07/21   [provider]  hydrALAZINE (APRESOLINE) 25 MG tablet Take 25 mg by mouth 2 (two) times daily. 02/14/21   [provider]  lisinopril (ZESTRIL) 20 MG tablet Take 20 mg by mouth daily. 12/31/20   [provider]  oxybutynin (DITROPAN) 5 MG tablet Take 5 mg by mouth 2 (two) times daily. 02/14/21   [provider]  pantoprazole (PROTONIX) 40 MG tablet Take 40 mg by mouth daily. 02/14/21   [provider]  PARoxetine (PAXIL) 20 MG tablet Take 20 mg by mouth daily. 02/14/21   [provider]  traZODone (DESYREL) 100 MG tablet Take 100 mg by mouth at bedtime. 02/14/21   [provider]    Allergies    Patient has no known allergies.  Review of Systems   Review of Systems  Unable to perform ROS: Dementia  Respiratory:  Negative for shortness of breath.   Cardiovascular:  Negative for chest pain.  Gastrointestinal:  Positive for vomiting. Negative for abdominal pain and blood in stool.  Musculoskeletal:  Negative for back pain and neck pain.  Skin:  Positive for wound.  Neurological:  Positive for headaches. Negative for dizziness, weakness and light-headedness.   Physical Exam Updated Vital Signs BP 129/78   Pulse 86   Temp 98.7 F (37.1 C) (Oral)   Resp 18   Ht 4\' 10"  (1.473 m)   Wt 99.8 kg   SpO2 100%  BMI 45.98 kg/m   Physical Exam Vitals and nursing note reviewed.  Constitutional:      General: She is not in acute distress.    Appearance: She is well-developed. She is not diaphoretic.    HENT:     Head: Normocephalic.     Mouth/Throat:     Mouth: Mucous membranes are moist.  Eyes:     Extraocular Movements: Extraocular movements intact.     Pupils: Pupils are equal, round, and reactive to light.  Cardiovascular:     Rate and Rhythm: Normal rate. Rhythm irregular.     Heart sounds: Normal heart sounds.  Pulmonary:     Effort: Pulmonary effort is normal.  Abdominal:     Palpations: Abdomen is  soft.     Tenderness: There is no abdominal tenderness.  Musculoskeletal:        General: No swelling, tenderness or deformity.     Cervical back: Normal range of motion and neck supple. No tenderness or bony tenderness.     Thoracic back: No tenderness or bony tenderness.     Lumbar back: No tenderness. Normal range of motion.     Right lower leg: No edema.     Left lower leg: No edema.     Comments: Range of motion upper and lower extremities without pain or limitation.  No midline neck or back tenderness.  Skin:    General: Skin is warm and dry.     Findings: No erythema or rash.  Neurological:     General: No focal deficit present.     Mental Status: She is alert. Mental status is at baseline.     Cranial Nerves: No cranial nerve deficit.     Sensory: No sensory deficit.     Motor: No weakness.  Psychiatric:        Behavior: Behavior normal.    ED Results / Procedures / Treatments   Labs (all labs ordered are listed, but only abnormal results are displayed) Labs Reviewed  COMPREHENSIVE METABOLIC PANEL - Abnormal; Notable for the following components:      Result Value   CO2 17 (*)    Glucose, Bld 123 (*)    Creatinine, Ser 1.35 (*)    Total Protein 6.3 (*)    Albumin 3.4 (*)    GFR, Estimated 38 (*)    All other components within normal limits  CBC WITH DIFFERENTIAL/PLATELET - Abnormal; Notable for the following components:   RBC 3.34 (*)    Hemoglobin 8.4 (*)    HCT 28.3 (*)    MCH 25.1 (*)    MCHC 29.7 (*)    RDW 15.9 (*)    nRBC 0.3 (*)    All other components within normal limits  URINALYSIS, ROUTINE W REFLEX MICROSCOPIC - Abnormal; Notable for the following components:   Bilirubin Urine SMALL (*)    Ketones, ur >80 (*)    All other components within normal limits  I-STAT CHEM 8, ED - Abnormal; Notable for the following components:   BUN 24 (*)    Creatinine, Ser 1.20 (*)    Glucose, Bld 119 (*)    TCO2 19 (*)    Hemoglobin 9.2 (*)    HCT 27.0 (*)     All other components within normal limits  PROTIME-INR    EKG EKG Interpretation  Date/Time:  Wednesday March 21 2021 15:21:59 EDT Ventricular Rate:  86 PR Interval:  129 QRS Duration: 84 QT Interval:  390 QTC Calculation: 467 R  Axis:   24 Text Interpretation: Sinus arrhythmia Probable left atrial enlargement Confirmed by Thamas Jaegers ([id number]) on 03/21/2021 3:54:32 PM  Radiology CT Head Wo Contrast  Result Date: 03/21/2021 CLINICAL DATA:  Head trauma, minor (Age >= 65y); Neck trauma (Age >= 65y) EXAM: CT HEAD WITHOUT CONTRAST CT CERVICAL SPINE WITHOUT CONTRAST TECHNIQUE: Multidetector CT imaging of the head and cervical spine was performed following the standard protocol without intravenous contrast. Multiplanar CT image reconstructions of the cervical spine were also generated. COMPARISON:  None. FINDINGS: CT HEAD FINDINGS Brain: Remote appearing infarcts in the high left frontal lobe with encephalomalacia. No evidence of acute large vascular territory infarction, hemorrhage, hydrocephalus, extra-axial collection or mass lesion/mass effect. Vascular: No hyperdense vessel identified. Calcific intracranial atherosclerosis. Skull: Right-sided scalp hematoma without acute fracture. Sinuses/Orbits: Clear visualized sinuses. No acute orbital findings. Other: No mastoid effusions. CT CERVICAL SPINE FINDINGS Alignment: Mild anterolisthesis of C2 on C3 and C7 on T1, likely degenerative given severe facet arthropathy at this level. Otherwise, no significant sagittal subluxation. Skull base and vertebrae: No evidence of acute fracture. Vertebral body heights are maintained. Soft tissues and spinal canal: No prevertebral fluid or swelling. No visible canal hematoma. Disc levels: Multilevel degenerative disc disease, greatest in severe at C5-C6 and C6-C7 where there is marked disc height loss, endplate sclerosis and posterior disc osteophyte complex. Multilevel facet arthropathy Upper chest: Visualized lung  apices are clear. IMPRESSION: CT head: 1. No evidence of acute intracranial abnormality. 2. Right-sided scalp hematoma without acute fracture. 3. Remote appearing infarcts in the high left frontal lobe. CT cervical spine: 1. No evidence of acute fracture or traumatic malalignment. 2. Severe multilevel degenerative change, detailed above. Electronically Signed   By: Margaretha Sheffield M.D.   On: 03/21/2021 16:11   CT Cervical Spine Wo Contrast  Result Date: 03/21/2021 CLINICAL DATA:  Head trauma, minor (Age >= 65y); Neck trauma (Age >= 65y) EXAM: CT HEAD WITHOUT CONTRAST CT CERVICAL SPINE WITHOUT CONTRAST TECHNIQUE: Multidetector CT imaging of the head and cervical spine was performed following the standard protocol without intravenous contrast. Multiplanar CT image reconstructions of the cervical spine were also generated. COMPARISON:  None. FINDINGS: CT HEAD FINDINGS Brain: Remote appearing infarcts in the high left frontal lobe with encephalomalacia. No evidence of acute large vascular territory infarction, hemorrhage, hydrocephalus, extra-axial collection or mass lesion/mass effect. Vascular: No hyperdense vessel identified. Calcific intracranial atherosclerosis. Skull: Right-sided scalp hematoma without acute fracture. Sinuses/Orbits: Clear visualized sinuses. No acute orbital findings. Other: No mastoid effusions. CT CERVICAL SPINE FINDINGS Alignment: Mild anterolisthesis of C2 on C3 and C7 on T1, likely degenerative given severe facet arthropathy at this level. Otherwise, no significant sagittal subluxation. Skull base and vertebrae: No evidence of acute fracture. Vertebral body heights are maintained. Soft tissues and spinal canal: No prevertebral fluid or swelling. No visible canal hematoma. Disc levels: Multilevel degenerative disc disease, greatest in severe at C5-C6 and C6-C7 where there is marked disc height loss, endplate sclerosis and posterior disc osteophyte complex. Multilevel facet arthropathy  Upper chest: Visualized lung apices are clear. IMPRESSION: CT head: 1. No evidence of acute intracranial abnormality. 2. Right-sided scalp hematoma without acute fracture. 3. Remote appearing infarcts in the high left frontal lobe. CT cervical spine: 1. No evidence of acute fracture or traumatic malalignment. 2. Severe multilevel degenerative change, detailed above. Electronically Signed   By: Margaretha Sheffield M.D.   On: 03/21/2021 16:11    Procedures Procedures   Medications Ordered in ED Medications  sodium chloride 0.9 %  bolus 500 mL (0 mLs Intravenous Stopped 03/21/21 2009)    ED Course  I have reviewed the triage vital signs and the nursing notes.  Pertinent labs & imaging results that were available during my care of the patient were reviewed by me and considered in my medical decision making (see chart for details).  Clinical Course as of 03/21/21 2307  Wed Mar 21, 5444  [id number] 87 year old female brought in by EMS from independent living at [facility] for fall x 2 today. Patient has dementia, is at baseline per daughter who arrived at bedside shortly after patient.  Patient is on Plavix, small lac to right posterior scalp not requiring closure. No LOC, did have 1 episode of vomiting after the second fall, none since. No other injuries.  [LM]  2258 CT head and C-spine without acute injury.  Labs reviewed, known anemia, prior hgb 9.1, currently 8.4, stool soft and brown, was scheduled to see her PCP for recheck today but missed appointment due to ER visit for falls. CMP with elevated Cr to 1.35, previously 1.26, with GFR 38. Suspect patient has been not eating/drinking, daughter states she has not been going to the meal hall for the past 2 days and has not taken her meds since Sunday.  UA with ketones, no UTI.  Patient was given a small IV bolus, ambulated with a walker without assistance with a steady gait. Not orthostatic. Patient is feeling well and would like to go home. Call to  patient's daughter Maudie Mercury, reviewed results. Plan is for transport back to patient's [employer] apartment, will relay to night shift team, please call Maudie Mercury when ETA is known so she can go to the apartment. Plan is for Maudie Mercury to discuss additional care needs tomorrow.  [LM]    Clinical Course User Index [LM] Roque Lias   MDM Rules/Calculators/A&P                           Final Clinical Impression(s) / ED Diagnoses Final diagnoses:  Fall, initial encounter  Laceration of scalp, initial encounter  Dehydration    Rx / DC Orders ED Discharge Orders     None        Roque Lias 03/21/21 2307    Luna Fuse, MD 04/04/21 [phone]

## 2021-03-22 DIAGNOSIS — R29898 Other symptoms and signs involving the musculoskeletal system: Secondary | ICD-10-CM | POA: Diagnosis not present

## 2021-03-22 DIAGNOSIS — R488 Other symbolic dysfunctions: Secondary | ICD-10-CM | POA: Diagnosis not present

## 2021-03-22 DIAGNOSIS — W19XXXA Unspecified fall, initial encounter: Secondary | ICD-10-CM | POA: Diagnosis not present

## 2021-03-22 DIAGNOSIS — I69320 Aphasia following cerebral infarction: Secondary | ICD-10-CM | POA: Diagnosis not present

## 2021-03-22 DIAGNOSIS — Z7401 Bed confinement status: Secondary | ICD-10-CM | POA: Diagnosis not present

## 2021-03-22 DIAGNOSIS — M6281 Muscle weakness (generalized): Secondary | ICD-10-CM | POA: Diagnosis not present

## 2021-03-22 DIAGNOSIS — Z743 Need for continuous supervision: Secondary | ICD-10-CM | POA: Diagnosis not present

## 2021-03-22 DIAGNOSIS — R531 Weakness: Secondary | ICD-10-CM | POA: Diagnosis not present

## 2021-03-22 NOTE — ED Notes (Signed)
Pt ambulated independently w/ walker, pt denies dizziness/lightheadedness

## 2021-03-23 DIAGNOSIS — R488 Other symbolic dysfunctions: Secondary | ICD-10-CM | POA: Diagnosis not present

## 2021-03-23 DIAGNOSIS — D649 Anemia, unspecified: Secondary | ICD-10-CM | POA: Diagnosis not present

## 2021-03-23 DIAGNOSIS — F5101 Primary insomnia: Secondary | ICD-10-CM | POA: Diagnosis not present

## 2021-03-23 DIAGNOSIS — M6281 Muscle weakness (generalized): Secondary | ICD-10-CM | POA: Diagnosis not present

## 2021-03-24 DIAGNOSIS — R296 Repeated falls: Secondary | ICD-10-CM | POA: Diagnosis not present

## 2021-03-24 DIAGNOSIS — R2681 Unsteadiness on feet: Secondary | ICD-10-CM | POA: Diagnosis not present

## 2021-03-24 DIAGNOSIS — R2689 Other abnormalities of gait and mobility: Secondary | ICD-10-CM | POA: Diagnosis not present

## 2021-03-24 DIAGNOSIS — I6789 Other cerebrovascular disease: Secondary | ICD-10-CM | POA: Diagnosis not present

## 2021-03-26 DIAGNOSIS — M6281 Muscle weakness (generalized): Secondary | ICD-10-CM | POA: Diagnosis not present

## 2021-03-26 DIAGNOSIS — I6789 Other cerebrovascular disease: Secondary | ICD-10-CM | POA: Diagnosis not present

## 2021-03-26 DIAGNOSIS — R488 Other symbolic dysfunctions: Secondary | ICD-10-CM | POA: Diagnosis not present

## 2021-03-26 DIAGNOSIS — R2689 Other abnormalities of gait and mobility: Secondary | ICD-10-CM | POA: Diagnosis not present

## 2021-03-26 DIAGNOSIS — R296 Repeated falls: Secondary | ICD-10-CM | POA: Diagnosis not present

## 2021-03-26 DIAGNOSIS — R2681 Unsteadiness on feet: Secondary | ICD-10-CM | POA: Diagnosis not present

## 2021-03-26 DIAGNOSIS — I69320 Aphasia following cerebral infarction: Secondary | ICD-10-CM | POA: Diagnosis not present

## 2021-03-27 DIAGNOSIS — R488 Other symbolic dysfunctions: Secondary | ICD-10-CM | POA: Diagnosis not present

## 2021-03-27 DIAGNOSIS — I6789 Other cerebrovascular disease: Secondary | ICD-10-CM | POA: Diagnosis not present

## 2021-03-27 DIAGNOSIS — R2681 Unsteadiness on feet: Secondary | ICD-10-CM | POA: Diagnosis not present

## 2021-03-27 DIAGNOSIS — I69320 Aphasia following cerebral infarction: Secondary | ICD-10-CM | POA: Diagnosis not present

## 2021-03-27 DIAGNOSIS — M6281 Muscle weakness (generalized): Secondary | ICD-10-CM | POA: Diagnosis not present

## 2021-03-27 DIAGNOSIS — R2689 Other abnormalities of gait and mobility: Secondary | ICD-10-CM | POA: Diagnosis not present

## 2021-03-27 DIAGNOSIS — R296 Repeated falls: Secondary | ICD-10-CM | POA: Diagnosis not present

## 2021-03-28 ENCOUNTER — Ambulatory Visit: Payer: Medicare Other | Admitting: Adult Health

## 2021-03-28 ENCOUNTER — Inpatient Hospital Stay (HOSPITAL_COMMUNITY)
Admission: EM | Admit: 2021-03-28 | Discharge: 2021-03-30 | DRG: 066 | Disposition: A | Discharge status: Skilled Nursing Facility | Payer: Medicare Other | Attending: Family Medicine | Admitting: Family Medicine

## 2021-03-28 ENCOUNTER — Encounter: Payer: Self-pay | Admitting: *Deleted

## 2021-03-28 ENCOUNTER — Encounter: Payer: Self-pay | Admitting: Adult Health

## 2021-03-28 ENCOUNTER — Telehealth: Payer: Self-pay | Admitting: *Deleted

## 2021-03-28 VITALS — BP 129/61 | HR 71 | Ht <= 58 in | Wt 173.0 lb

## 2021-03-28 DIAGNOSIS — S5002XA Contusion of left elbow, initial encounter: Secondary | ICD-10-CM | POA: Diagnosis present

## 2021-03-28 DIAGNOSIS — E669 Obesity, unspecified: Secondary | ICD-10-CM | POA: Diagnosis present

## 2021-03-28 DIAGNOSIS — Z9181 History of falling: Secondary | ICD-10-CM

## 2021-03-28 DIAGNOSIS — E1169 Type 2 diabetes mellitus with other specified complication: Secondary | ICD-10-CM | POA: Diagnosis present

## 2021-03-28 DIAGNOSIS — I69398 Other sequelae of cerebral infarction: Secondary | ICD-10-CM

## 2021-03-28 DIAGNOSIS — D631 Anemia in chronic kidney disease: Secondary | ICD-10-CM | POA: Diagnosis present

## 2021-03-28 DIAGNOSIS — K219 Gastro-esophageal reflux disease without esophagitis: Secondary | ICD-10-CM | POA: Diagnosis present

## 2021-03-28 DIAGNOSIS — S0003XA Contusion of scalp, initial encounter: Secondary | ICD-10-CM | POA: Diagnosis present

## 2021-03-28 DIAGNOSIS — E1122 Type 2 diabetes mellitus with diabetic chronic kidney disease: Secondary | ICD-10-CM | POA: Diagnosis present

## 2021-03-28 DIAGNOSIS — F0390 Unspecified dementia without behavioral disturbance: Secondary | ICD-10-CM | POA: Diagnosis present

## 2021-03-28 DIAGNOSIS — Z6837 Body mass index (BMI) 37.0-37.9, adult: Secondary | ICD-10-CM | POA: Diagnosis not present

## 2021-03-28 DIAGNOSIS — R4781 Slurred speech: Secondary | ICD-10-CM | POA: Diagnosis present

## 2021-03-28 DIAGNOSIS — I129 Hypertensive chronic kidney disease with stage 1 through stage 4 chronic kidney disease, or unspecified chronic kidney disease: Secondary | ICD-10-CM | POA: Diagnosis present

## 2021-03-28 DIAGNOSIS — Z87440 Personal history of urinary (tract) infections: Secondary | ICD-10-CM

## 2021-03-28 DIAGNOSIS — S199XXA Unspecified injury of neck, initial encounter: Secondary | ICD-10-CM | POA: Diagnosis not present

## 2021-03-28 DIAGNOSIS — I10 Essential (primary) hypertension: Secondary | ICD-10-CM

## 2021-03-28 DIAGNOSIS — F39 Unspecified mood [affective] disorder: Secondary | ICD-10-CM | POA: Diagnosis present

## 2021-03-28 DIAGNOSIS — R4189 Other symptoms and signs involving cognitive functions and awareness: Secondary | ICD-10-CM | POA: Diagnosis not present

## 2021-03-28 DIAGNOSIS — R279 Unspecified lack of coordination: Secondary | ICD-10-CM | POA: Diagnosis not present

## 2021-03-28 DIAGNOSIS — R197 Diarrhea, unspecified: Secondary | ICD-10-CM

## 2021-03-28 DIAGNOSIS — I639 Cerebral infarction, unspecified: Secondary | ICD-10-CM | POA: Diagnosis not present

## 2021-03-28 DIAGNOSIS — R269 Unspecified abnormalities of gait and mobility: Secondary | ICD-10-CM | POA: Diagnosis not present

## 2021-03-28 DIAGNOSIS — S299XXA Unspecified injury of thorax, initial encounter: Secondary | ICD-10-CM | POA: Diagnosis not present

## 2021-03-28 DIAGNOSIS — I69331 Monoplegia of upper limb following cerebral infarction affecting right dominant side: Secondary | ICD-10-CM | POA: Diagnosis not present

## 2021-03-28 DIAGNOSIS — I6782 Cerebral ischemia: Secondary | ICD-10-CM | POA: Diagnosis present

## 2021-03-28 DIAGNOSIS — I6381 Other cerebral infarction due to occlusion or stenosis of small artery: Secondary | ICD-10-CM | POA: Diagnosis not present

## 2021-03-28 DIAGNOSIS — Z7401 Bed confinement status: Secondary | ICD-10-CM | POA: Diagnosis not present

## 2021-03-28 DIAGNOSIS — Z741 Need for assistance with personal care: Secondary | ICD-10-CM | POA: Diagnosis not present

## 2021-03-28 DIAGNOSIS — R296 Repeated falls: Secondary | ICD-10-CM | POA: Diagnosis not present

## 2021-03-28 DIAGNOSIS — W19XXXA Unspecified fall, initial encounter: Secondary | ICD-10-CM | POA: Diagnosis not present

## 2021-03-28 DIAGNOSIS — W1830XA Fall on same level, unspecified, initial encounter: Secondary | ICD-10-CM | POA: Diagnosis present

## 2021-03-28 DIAGNOSIS — R4701 Aphasia: Secondary | ICD-10-CM | POA: Diagnosis not present

## 2021-03-28 DIAGNOSIS — R262 Difficulty in walking, not elsewhere classified: Secondary | ICD-10-CM | POA: Diagnosis not present

## 2021-03-28 DIAGNOSIS — R2681 Unsteadiness on feet: Secondary | ICD-10-CM | POA: Diagnosis not present

## 2021-03-28 DIAGNOSIS — I6389 Other cerebral infarction: Secondary | ICD-10-CM

## 2021-03-28 DIAGNOSIS — F32A Depression, unspecified: Secondary | ICD-10-CM | POA: Diagnosis not present

## 2021-03-28 DIAGNOSIS — S0990XA Unspecified injury of head, initial encounter: Secondary | ICD-10-CM | POA: Diagnosis not present

## 2021-03-28 DIAGNOSIS — E785 Hyperlipidemia, unspecified: Secondary | ICD-10-CM

## 2021-03-28 DIAGNOSIS — I4891 Unspecified atrial fibrillation: Secondary | ICD-10-CM | POA: Diagnosis not present

## 2021-03-28 DIAGNOSIS — Y92129 Unspecified place in nursing home as the place of occurrence of the external cause: Secondary | ICD-10-CM

## 2021-03-28 DIAGNOSIS — R2689 Other abnormalities of gait and mobility: Secondary | ICD-10-CM | POA: Diagnosis not present

## 2021-03-28 DIAGNOSIS — Z9884 Bariatric surgery status: Secondary | ICD-10-CM | POA: Diagnosis not present

## 2021-03-28 DIAGNOSIS — Y92128 Other place in nursing home as the place of occurrence of the external cause: Secondary | ICD-10-CM | POA: Diagnosis not present

## 2021-03-28 DIAGNOSIS — S3993XA Unspecified injury of pelvis, initial encounter: Secondary | ICD-10-CM | POA: Diagnosis not present

## 2021-03-28 DIAGNOSIS — Z87891 Personal history of nicotine dependence: Secondary | ICD-10-CM

## 2021-03-28 DIAGNOSIS — D649 Anemia, unspecified: Secondary | ICD-10-CM

## 2021-03-28 DIAGNOSIS — R29700 NIHSS score 0: Secondary | ICD-10-CM | POA: Diagnosis present

## 2021-03-28 DIAGNOSIS — Z043 Encounter for examination and observation following other accident: Secondary | ICD-10-CM | POA: Diagnosis not present

## 2021-03-28 DIAGNOSIS — Z20822 Contact with and (suspected) exposure to covid-19: Secondary | ICD-10-CM | POA: Diagnosis not present

## 2021-03-28 DIAGNOSIS — N3281 Overactive bladder: Secondary | ICD-10-CM | POA: Diagnosis present

## 2021-03-28 DIAGNOSIS — M6259 Muscle wasting and atrophy, not elsewhere classified, multiple sites: Secondary | ICD-10-CM | POA: Diagnosis not present

## 2021-03-28 DIAGNOSIS — I672 Cerebral atherosclerosis: Secondary | ICD-10-CM | POA: Diagnosis not present

## 2021-03-28 DIAGNOSIS — N1832 Chronic kidney disease, stage 3b: Secondary | ICD-10-CM | POA: Diagnosis not present

## 2021-03-28 DIAGNOSIS — G459 Transient cerebral ischemic attack, unspecified: Secondary | ICD-10-CM | POA: Diagnosis not present

## 2021-03-28 DIAGNOSIS — I6789 Other cerebrovascular disease: Secondary | ICD-10-CM | POA: Diagnosis not present

## 2021-03-28 DIAGNOSIS — I6603 Occlusion and stenosis of bilateral middle cerebral arteries: Secondary | ICD-10-CM | POA: Diagnosis not present

## 2021-03-28 DIAGNOSIS — Z743 Need for continuous supervision: Secondary | ICD-10-CM | POA: Diagnosis not present

## 2021-03-28 DIAGNOSIS — M6281 Muscle weakness (generalized): Secondary | ICD-10-CM | POA: Diagnosis not present

## 2021-03-28 MED ORDER — ASPIRIN EC 325 MG PO TBEC: 325.0000 mg | DELAYED_RELEASE_TABLET | Freq: Every day | ORAL | 11 refills | Status: AC

## 2021-03-28 NOTE — ED Triage Notes (Signed)
Pt BIB [facility] from [facility] for multiple reported mechanical falls over the last several days. Today she had a mechanical fall in which it is reported she hit her head.    She takes Plavix with hx of stroke and UTI's.    Pt is in C-collar.  Pt also has large dark bruise to her inner left arm. Arm is tender if grabbed on bruise.  EMS reports Daughter will be her in an hr or so.  Daughter states that pt was seen at a neurologist today and that neurologist requested she be seen here by neurology and get an MRI.

## 2021-03-28 NOTE — Progress Notes (Signed)
   03/28/21 2315  Clinical Encounter Type  Visited With Patient not available  Visit Type Trauma  Referral From Nurse  Consult/Referral To Chaplain   Chaplain responded to level 2 page. The patient is being treated by the medical team. Chaplain remains available for follow up spiritual/emotional support as needed. This note was prepared by Jeanine Luz, M.Div..  For questions please contact by phone [phone].

## 2021-03-28 NOTE — ED Provider Notes (Signed)
[facility] EMERGENCY DEPARTMENT Provider Note  CSN: [id number] Arrival date & time: 03/28/21 2332  Chief Complaint(s) No chief complaint on file. ED Triage Notes Colbert Ewing, RN (Registered Nurse)   Emergency Medicine   Date of Service: 03/28/2021 11:38 PM   Signed   Pt BIB [facility] from [facility] for multiple reported mechanical falls over the last several days. Today she had a mechanical fall in which it is reported she hit her head.     She takes Plavix with hx of stroke and UTI's.     Pt is in C-collar.  Pt also has large dark bruise to her inner left arm. Arm is tender if grabbed on bruise.   EMS reports Daughter will be her in an hr or so.  Daughter states that pt was seen at a neurologist today and that neurologist requested she be seen here by neurology and get an MRI.      HPI Lindsay Martinez is a 87 y.o. female with a past medical history of stroke on Plavix living in a skilled nursing facility who presents after a fall with reported head trauma.  No reported loss of consciousness.  Patient has had frequent falls recently being followed by neurology.  Patient denies any physical complaints currently including headache, neck pain, back pain, chest pain, hip pain, abdominal pain or extremity pain.  Patient has a large area of ecchymosis in the medial aspect of the left upper extremity about the elbow that is tender when the contusion is palpated but she denies any pain with range of motion.  Patient cannot tell me why she fell or the circumstances around the fall.  She is oriented to year, place, and self.  Do not say what day or month it is.  Remainder of history, ROS, and physical exam limited due to patient's condition (confusion). Additional information was obtained from EMS.   Level V Caveat.   The history is provided by the patient and the EMS personnel.   Past Medical History No past medical history on file. Patient Active Problem List    Diagnosis Date Noted   Acute CVA (cerebrovascular accident) ([place]) 03/29/2021    Home Medication(s) Prior to Admission medications   Medication Sig Start Date End Date Taking? Authorizing Provider  atorvastatin (LIPITOR) 10 MG tablet Take 10 mg by mouth daily. 12/31/20  Yes [provider]  clopidogrel (PLAVIX) 75 MG tablet Take 75 mg by mouth daily. 03/07/21  Yes [provider]  ferrous sulfate 324 MG TBEC Take 324 mg by mouth in the morning and at bedtime.   Yes [provider]  hydrALAZINE (APRESOLINE) 25 MG tablet Take 25 mg by mouth in the morning and at bedtime. 02/14/21  Yes [provider]  lisinopril (ZESTRIL) 20 MG tablet Take 20 mg by mouth daily. 12/31/20  Yes [provider]  oxybutynin (DITROPAN) 5 MG tablet Take 5 mg by mouth in the morning and at bedtime. 02/14/21  Yes [provider]  pantoprazole (PROTONIX) 40 MG tablet Take 40 mg by mouth daily. 02/14/21  Yes [provider]  PARoxetine (PAXIL) 20 MG tablet Take 20 mg by mouth daily. 02/14/21  Yes [provider]  PROCTOZONE-HC 2.5 % rectal cream Place 1 application rectally daily as needed for hemorrhoids. 02/25/21  Yes [provider]  traZODone (DESYREL) 100 MG tablet Take 50 mg by mouth at bedtime. 02/14/21  Yes [provider]  amLODipine (NORVASC) 10 MG tablet Take 10  mg by mouth daily. Patient not taking: Reported on 03/29/2021 02/14/21   [provider]                                                                                                                                    Past Surgical History ** The histories are not reviewed yet. Please review them in the "History" navigator section and refresh this [place]. Family History No family history on file.  Social History   Allergies Patient has no known allergies.  Review of Systems Review of Systems All other systems are reviewed and are negative for acute change  except as noted in the HPI  Physical Exam Vital Signs  I have reviewed the triage vital signs BP (!) 146/99   Pulse 78   Temp 97.6 F (36.4 C) (Oral)   Resp 17   Ht 4\' 9"  (1.448 m)   Wt 78 kg   SpO2 97%   BMI 37.22 kg/m   Physical Exam Vitals reviewed.  Constitutional:      General: She is not in acute distress.    Appearance: She is well-developed. She is not diaphoretic.  HENT:     Head: Normocephalic and atraumatic.     Nose: Nose normal.  Eyes:     General: No scleral icterus.       Right eye: No discharge.        Left eye: No discharge.     Conjunctiva/sclera: Conjunctivae normal.     Pupils: Pupils are equal, round, and reactive to light.  Cardiovascular:     Rate and Rhythm: Normal rate and regular rhythm.     Heart sounds: No murmur heard.   No friction rub. No gallop.  Pulmonary:     Effort: Pulmonary effort is normal. No respiratory distress.     Breath sounds: Normal breath sounds. No stridor. No rales.  Abdominal:     General: There is no distension.     Palpations: Abdomen is soft.     Tenderness: There is no abdominal tenderness.  Musculoskeletal:        General: No tenderness.     Cervical back: Normal range of motion and neck supple.  Skin:    General: Skin is warm and dry.     Findings: No erythema or rash.  Neurological:     Mental Status: She is alert and oriented to person, place, and time.     Comments: Mental Status:  Alert and oriented to person, place, and time (year only).  Attention and concentration delayed  Speech clear.   Cranial Nerves:  II Visual Fields: Intact to confrontation. Visual fields intact. III, IV, VI: Pupils equal and reactive to light and near. Full eye movement without nystagmus  V Facial Sensation: Normal. No weakness of masticatory muscles  VII: No facial weakness or asymmetry  VIII Auditory Acuity: Grossly normal  IX/X: The uvula is midline; the palate  elevates symmetrically  XI: Normal sternocleidomastoid  and trapezius strength  XII: The tongue is midline. No atrophy or fasciculations.   Motor System: Muscle Strength: 5/5 and symmetric in the upper and lower extremities. No pronation or drift.  Muscle Tone: Tone and muscle bulk are normal in the upper and lower extremities.  No Clonus Coordination: Intact finger-to-nose. No tremor.  Sensation: Intact to light touch. Gait: deferred     ED Results and Treatments Labs (all labs ordered are listed, but only abnormal results are displayed) Labs Reviewed  CBC WITH DIFFERENTIAL/PLATELET - Abnormal; Notable for the following components:      Result Value   RBC 3.10 (*)    Hemoglobin 7.9 (*)    HCT 26.8 (*)    MCH 25.5 (*)    MCHC 29.5 (*)    RDW 18.0 (*)    Abs Immature Granulocytes 0.16 (*)    All other components within normal limits  COMPREHENSIVE METABOLIC PANEL - Abnormal; Notable for the following components:   Sodium 134 (*)    CO2 21 (*)    Glucose, Bld 108 (*)    Creatinine, Ser 1.40 (*)    Total Protein 6.2 (*)    GFR, Estimated 36 (*)    All other components within normal limits  URINALYSIS, ROUTINE W REFLEX MICROSCOPIC - Abnormal; Notable for the following components:   Color, Urine STRAW (*)    APPearance HAZY (*)    Specific Gravity, Urine 1.003 (*)    All other components within normal limits  SARS CORONAVIRUS 2 (TAT 6-24 HRS)  PROTIME-INR  BRAIN NATRIURETIC PEPTIDE  MAGNESIUM  TROPONIN I (HIGH SENSITIVITY)  TROPONIN I (HIGH SENSITIVITY)                                                                                                                         EKG  EKG Interpretation  Date/Time:  Wednesday March 28 2021 23:41:18 EDT Ventricular Rate:  81 PR Interval:  147 QRS Duration: 80 QT Interval:  376 QTC Calculation: 442 R Axis:   19 Text Interpretation: Sinus rhythm Multiple premature complexes, vent & supraven Abnormal R-wave progression, early transition No old tracing to compare Confirmed by  Addison Lank [phone]) on 03/28/2021 11:59:13 PM       Radiology DG Elbow Complete Left  Result Date: 03/29/2021 CLINICAL DATA:  Fall EXAM: LEFT ELBOW - COMPLETE 3+ VIEW COMPARISON:  None. FINDINGS: No fracture or dislocation is seen. The joint spaces are preserved. The visualized soft tissues are unremarkable. Lateral view is obliqued. No definite displaced fat pads to suggest an elbow joint effusion. IMPRESSION: Negative. Electronically Signed   By: Julian Hy M.D.   On: 03/29/2021 01:01   CT Head Wo Contrast  Result Date: 03/29/2021 CLINICAL DATA:  Head trauma, minor (Age >= 65y); Neck trauma (Age >= 65y). Multiple falls, head injury, chronic anticoagulation EXAM: CT HEAD WITHOUT CONTRAST CT CERVICAL SPINE WITHOUT CONTRAST TECHNIQUE: Multidetector CT imaging of the head and  cervical spine was performed following the standard protocol without intravenous contrast. Multiplanar CT image reconstructions of the cervical spine were also generated. COMPARISON:  CT and MRI head 02/08/2021 FINDINGS: CT HEAD FINDINGS Brain: Interval evolution of multiple cortical infarcts within the left frontal lobe since prior examination. Mild parenchymal volume loss is commensurate with the patient's age. Mild periventricular white matter changes are present, most in keeping with the sequela of small vessel ischemia. No acute intracranial hemorrhage or infarct. No abnormal mass effect or midline shift. No abnormal intra or extra-axial mass lesion. Ventricular size is normal. The cerebellum is unremarkable. Vascular: No asymmetric hyperdense vasculature at the skull base Skull: Intact Sinuses/Orbits: The paranasal sinuses are clear. The orbits are unremarkable save for removal of the ocular lenses. Other: Mastoid air cells and middle ear cavities are clear. Small right temporal scalp hematoma. CT CERVICAL SPINE FINDINGS Alignment: Straightening of the cervical spine noted. 3 mm anterolisthesis of C2 upon C3 is likely  degenerative in nature. Skull base and vertebrae: The craniocervical alignment is normal. The atlantodental interval is not widened. There is no acute fracture of the cervical spine. Vertebral body height is preserved. Soft tissues and spinal canal: Posterior disc osteophyte complex ease in combination with congenital narrowing of the spinal canal result in moderate central canal stenosis at C5-6 and C6-7 with the AP diameter of the spinal canal of approximately 6-7 mm at minimum with mild resultant flattening of the thecal sac. No canal hematoma. No prevertebral soft tissue swelling or fluid. No pathologic cervical adenopathy. The visualized thyroid is unremarkable peer Disc levels: There is intervertebral disc space narrowing and endplate remodeling of D6-U4 in keeping with changes of severe degenerative disc disease. Milder degenerative changes are noted at C2-3 and C7-T1. The prevertebral soft tissues are not thickened on sagittal reformats. Review of the axial images demonstrates multilevel uncovertebral and facet arthrosis resulting in multilevel neuroforaminal narrowing, most severe at C3-4 bilaterally, C4-5 on the right, and C5-6 bilaterally Upper chest: Negative. Other: None IMPRESSION: No acute intracranial injury. No calvarial fracture. Small right temporal scalp hematoma. Interval evolution of multiple small left frontal cortical infarcts. Stable senescent change No acute fracture or listhesis of the cervical spine. Advanced degenerative disc and degenerative joint disease resulting in moderate central canal stenosis and multilevel neuroforaminal narrowing as described above. Electronically Signed   By: Fidela Salisbury M.D.   On: 03/29/2021 00:49   CT Cervical Spine Wo Contrast  Result Date: 03/29/2021 CLINICAL DATA:  Head trauma, minor (Age >= 65y); Neck trauma (Age >= 65y). Multiple falls, head injury, chronic anticoagulation EXAM: CT HEAD WITHOUT CONTRAST CT CERVICAL SPINE WITHOUT CONTRAST  TECHNIQUE: Multidetector CT imaging of the head and cervical spine was performed following the standard protocol without intravenous contrast. Multiplanar CT image reconstructions of the cervical spine were also generated. COMPARISON:  CT and MRI head 02/08/2021 FINDINGS: CT HEAD FINDINGS Brain: Interval evolution of multiple cortical infarcts within the left frontal lobe since prior examination. Mild parenchymal volume loss is commensurate with the patient's age. Mild periventricular white matter changes are present, most in keeping with the sequela of small vessel ischemia. No acute intracranial hemorrhage or infarct. No abnormal mass effect or midline shift. No abnormal intra or extra-axial mass lesion. Ventricular size is normal. The cerebellum is unremarkable. Vascular: No asymmetric hyperdense vasculature at the skull base Skull: Intact Sinuses/Orbits: The paranasal sinuses are clear. The orbits are unremarkable save for removal of the ocular lenses. Other: Mastoid air cells and middle ear  cavities are clear. Small right temporal scalp hematoma. CT CERVICAL SPINE FINDINGS Alignment: Straightening of the cervical spine noted. 3 mm anterolisthesis of C2 upon C3 is likely degenerative in nature. Skull base and vertebrae: The craniocervical alignment is normal. The atlantodental interval is not widened. There is no acute fracture of the cervical spine. Vertebral body height is preserved. Soft tissues and spinal canal: Posterior disc osteophyte complex ease in combination with congenital narrowing of the spinal canal result in moderate central canal stenosis at C5-6 and C6-7 with the AP diameter of the spinal canal of approximately 6-7 mm at minimum with mild resultant flattening of the thecal sac. No canal hematoma. No prevertebral soft tissue swelling or fluid. No pathologic cervical adenopathy. The visualized thyroid is unremarkable peer Disc levels: There is intervertebral disc space narrowing and endplate  remodeling of C3-C7 in keeping with changes of severe degenerative disc disease. Milder degenerative changes are noted at C2-3 and C7-T1. The prevertebral soft tissues are not thickened on sagittal reformats. Review of the axial images demonstrates multilevel uncovertebral and facet arthrosis resulting in multilevel neuroforaminal narrowing, most severe at C3-4 bilaterally, C4-5 on the right, and C5-6 bilaterally Upper chest: Negative. Other: None IMPRESSION: No acute intracranial injury. No calvarial fracture. Small right temporal scalp hematoma. Interval evolution of multiple small left frontal cortical infarcts. Stable senescent change No acute fracture or listhesis of the cervical spine. Advanced degenerative disc and degenerative joint disease resulting in moderate central canal stenosis and multilevel neuroforaminal narrowing as described above. Electronically Signed   By: Fidela Salisbury M.D.   On: 03/29/2021 00:49   MR BRAIN WO CONTRAST  Result Date: 03/29/2021 CLINICAL DATA:  Multiple falls EXAM: MRI HEAD WITHOUT CONTRAST TECHNIQUE: Multiplanar, multiecho pulse sequences of the brain and surrounding structures were obtained without intravenous contrast. COMPARISON:  02/08/2021 FINDINGS: Brain: Expected evolution of diffusion signal changes within the bilateral corona radiata. There is a new area of abnormal diffusion restriction of the left cingulate gyrus. No acute or chronic hemorrhage. There is multifocal hyperintense T2-weighted signal within the white matter. Generalized volume loss without a clear lobar predilection. The midline structures are normal. Vascular: Major flow voids are preserved. Skull and upper cervical spine: Normal calvarium and skull base. Visualized upper cervical spine and soft tissues are normal. Sinuses/Orbits:No paranasal sinus fluid levels or advanced mucosal thickening. No mastoid or middle ear effusion. Normal orbits. IMPRESSION: 1. New area of abnormal diffusion  restriction of the left cingulate gyrus, consistent with acute ischemia. No hemorrhage or mass effect. 2. Expected evolution of subacute bilateral corona radiata infarcts. 3. Chronic small vessel ischemic disease and volume loss. Electronically Signed   By: Ulyses Jarred M.D.   On: 03/29/2021 03:16   DG Pelvis Portable  Result Date: 03/29/2021 CLINICAL DATA:  Fall, pelvic trauma EXAM: PORTABLE PELVIS 1-2 VIEWS COMPARISON:  None. FINDINGS: Normal alignment. No fracture or dislocation. Mild bilateral degenerative hip arthritis is present. Soft tissues are unremarkable. IMPRESSION: No acute fracture or dislocation. Electronically Signed   By: Fidela Salisbury M.D.   On: 03/29/2021 00:34   DG Chest Port 1 View  Result Date: 03/29/2021 CLINICAL DATA:  Fall, chest trauma EXAM: PORTABLE CHEST 1 VIEW COMPARISON:  None. FINDINGS: Lungs are clear. No pneumothorax or pleural effusion. Cardiac size is within normal limits. Pulmonary vascularity is normal. Surgical clips are seen within the right axilla. No acute bone abnormality. IMPRESSION: No active disease. Electronically Signed   By: Fidela Salisbury M.D.   On: 03/29/2021 00:34  Pertinent labs & imaging results that were available during my care of the patient were reviewed by me and considered in my medical decision making (see MDM for details).  Medications Ordered in ED Medications - No data to display                                                                                                                                   Procedures Procedures  (including critical care time)  Medical Decision Making / ED Course I have reviewed the nursing notes for this encounter and the patient's prior records (if available in EHR or on provided paperwork).  Edia Pursifull was evaluated in Emergency Department on 03/29/2021 for the symptoms described in the history of present illness. She was evaluated in the context of the global COVID-19 pandemic, which  necessitated consideration that the patient might be at risk for infection with the SARS-CoV-2 virus that causes COVID-19. Institutional protocols and algorithms that pertain to the evaluation of patients at risk for COVID-19 are in a state of rapid change based on information released by regulatory bodies including the CDC and federal and state organizations. These policies and algorithms were followed during the patient's care in the ED.     Level 2 fall on Plavix. ABCs intact. Secondary as above. Will obtain targeted imaging. Will also obtain screening labs to assess for any electrolyte derangements, renal insufficiency, evidence of possible UTI. Will also obtain cardiac work-up.   Daughter at bedside. 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  Was doing well at rehab up until 3 weeks ago and began to decline - having frequent falls. Seen last week for fall here. Negative work up. Today, she has fallen 4 times. She lives in independent living facility that does not have sNF or rehab capabilities.  Pertinent labs & imaging results that were available during my care of the patient were reviewed by me and considered in my medical decision making:  EKG normal sinus rhythm.  No acute ischemic changes no evidence of pericarditis.  No blocks.  Notable for frequent PACs. CT head negative for ICH.  Notable progression of patient's previous strokes. CT cervical spine negative for acute fracture but notable for moderate stenosis. Plain films of the chest and pelvis and left elbow negative for acute injuries.  Labs without leukocytosis.  Hemoglobin is 7.9 down from 8.4.  No reported GI bleed and patient is BUN is normal. Stable renal function.  No significant electrolyte derangements. UA without evidence of infection. Cardiac enzymes reassuring with negative troponin and BNP.  Given the noted progression of the patient's prior strokes, will obtain an MRI to assess for extent of  progression and possible new strokes affecting patient's mobility.  MRI shows new left cingulate gyrus stroke. Consulted neurology and admitted patient to the hospital service for further management.  Daughter and patient updated on findings and plan.   Final Clinical Impression(s) /  ED Diagnoses Final diagnoses:  Fall at nursing home  Acute CVA (cerebrovascular accident) [facility])     This chart was dictated using voice recognition software.  Despite best efforts to proofread,  errors can occur which can change the documentation meaning.    Fatima Blank, MD 03/29/21 [phone]

## 2021-03-28 NOTE — Patient Instructions (Addendum)
Continue working with therapies for hopeful recovery - continue use of walker at all times unless otherwise instructed   Continue aspirin 325 mg daily and clopidogrel 75 mg daily for total of 3 months then just plavix alone and atorvastatin 40mg  daily  for secondary stroke prevention  Continue to follow up with PCP regarding cholesterol, blood pressure and diabetes management  Maintain strict control of hypertension with blood pressure goal below 130/90, diabetes with hemoglobin A1c goal below 6.5% and cholesterol with LDL cholesterol (bad cholesterol) goal below 70 mg/dL.   Please complete heart monitor - I will reach out to cardiology to further assist with this     Followup in the future with me in 3 months or call earlier if needed     Thank you for coming to see [place] at [facility]. I hope we have been able to provide you high quality care today.  You may receive a patient satisfaction survey over the next few weeks. We would appreciate your feedback and comments so that we may continue to improve ourselves and the health of our patients.

## 2021-03-28 NOTE — Telephone Encounter (Signed)
[facility] never applied 30 day cardiac event monitor enrolled 02/14/21. Patient was discharged home to [facility] with monitor in her possessions. Original [employer] cardiac event monitor enrollment cancelled.  New [employer] enrollment entered using new address and assigning same monitor serial # [id number]. Mitch from [employer] notified.  Patients daughter, [name], will assist her mother with application when she is back in town after 04/02/2021. Letter with instructions mailed to daughter,  Leeanne Deed, [address] [place], [place]  [id number].

## 2021-03-28 NOTE — Progress Notes (Signed)
[facility] [address] [place] [address] [phone]       HOSPITAL FOLLOW UP NOTE  Ms. Promyse Ardito Ramey Date of Birth:  04/17/1931 Medical Record Number:  [id number]   Reason for Referral:  hospital stroke follow up    SUBJECTIVE:   CHIEF COMPLAINT:  Chief Complaint  Patient presents with   Follow-up    Rm 3 with daughter kim  Pt is well, daughter states she isn't able to carry conversation like she use to just quick short answers and having some gait complications     HPI:   Lindsay Martinez is a 87 year old female with history of diabetes, hypertension, hyperlipidemia, CKD, cognitive impairment admitted on 02/08/2021 for confusion, altered mental status, waxing and waning speech difficulty.  Personally reviewed pertinent progress notes, lab work and imaging.  Evaluated by Dr. Erlinda Hong.  CT concerning for left hemispheric infarct.  MRI showed right MCA/PCA punctate, bilateral MCA/ACA and ACA infarcts.  MRA showed bilateral A2 severe stenosis.  EF 70 to 75%.  EEG General mild diffuse encephalopathy, no seizures or epileptiform discharges.  A1c 6.1.  Direct LDL 64.4.  Orthostatic vital showed no orthostatic hypotension.  Creatinine 2.71->1.66, significant elevated from her baseline, concerning for dehydration.  No focal deficit on exam.  Etiology for stroke likely due to intracranial stenosis in the setting of dehydration and hypotension on presentation.  Symptoms resolved after IV fluid.  Recommended DAPT for 3 months and aspirin alone and initiated atorvastatin 10 mg daily.  Recommended BP goal 130-150 given intracranial stenosis.  Recommended 30-day cardiac event monitor given bilateral involvement.  PT/OT/SLP recommended SNF.  Today, 03/28/2021, Lindsay Martinez is being seen for hospital follow-up accompanied by her daughter, Lindsay Martinez who supplements majority of history.  She completed rehab at [facility] on 8/30 and is currently residing at [facility]. Per  daughter, initially making good progress but unfortunately was not able to get therapy started until last week due to communication issues.  She unfortunately had 2 falls on 9/21 with a second fall requiring ED evaluation as she struck the back of her head on a bookcase causing a small laceration.  This did not require closure and CT head and C-spine no acute injury.  She has not had any additional falls since that time.  She continues to participate in PT/OT/SLP.  Daughter is working on increasing assistance as hopeful she will improve where she is safe to remain in ILF.  Daughter reports continued gait impairment and worsening cognitive impairment compared to baseline.  Normally a very "chatty" person but since stroke, limited conversation.  Cognition will wax/wane. Daughter reports fecal incontinence which was not present prior to recent stroke.  She is also concerned regarding baseline anemia with iron deficiency and recently started on iron supplementation.  Remains on aspirin and Plavix as well as atorvastatin without side effects.  Blood pressure today 129/61.  She has not yet completed cardiac monitor - per daughter, received while in [facility] but apparently staff did not set this up nor inform daughter on what it was or how to use it.  They did bring it back from SNF to ILF but as daughter did not know what it was, it has not yet been completed.  No further concerns at this time.     PERTINENT IMAGING  MR BRAIN WO CONTRAST 02/08/2021 MR ANGIO HEAD/NECK IMPRESSION: MRI HEAD: 1. Patchy small volume acute to early subacute ischemic infarcts involving the high left frontal and parietal  lobes as well as the right parietal lobe as above. No associated hemorrhage or mass effect. 2. Underlying mild chronic microvascular ischemic disease.   MRA HEAD: 1. Negative intracranial MRA for large vessel occlusion. 2. Multifocal severe bilateral A2 stenoses. 3. Moderate diffuse small vessel  atheromatous change throughout the intracranial circulation. No other proximal high-grade or correctable stenosis. 4. 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 Attention at follow-up recommended.   MRA NECK: 1. Negative MRA of the neck. No hemodynamically significant or critical flow limiting stenosis identified. 2. Diffuse tortuosity of the major arterial vasculature of the neck, suggesting chronic underlying hypertension.   EEG adult 02/09/2021 IMPRESSION: This study is suggestive of mild diffuse encephalopathy, nonspecific etiology. No seizures or epileptiform discharges were seen throughout the recording.   2D ECHO 02/09/2021 IMPRESSIONS   1. Left ventricular ejection fraction, by estimation, is 70 to 75%. The  left ventricle has hyperdynamic function. The left ventricle has no  regional wall motion abnormalities. There is mild left ventricular  hypertrophy. Left ventricular diastolic  parameters are consistent with Grade I diastolic dysfunction (impaired  relaxation).   2. Right ventricular systolic function is normal. The right ventricular  size is moderately enlarged. There is moderately elevated pulmonary artery  systolic pressure. The estimated right ventricular systolic pressure is  89.3 mmHg.   3. Left atrial size was mild to moderately dilated.   4. The mitral valve is normal in structure. Mild mitral valve  regurgitation. No evidence of mitral stenosis.   5. The aortic valve is normal in structure. Aortic valve regurgitation is  not visualized. No aortic stenosis is present.   6. The inferior vena cava is normal in size with greater than 50%  respiratory variability, suggesting right atrial pressure of 3 mmHg.         ROS:   14 system review of systems performed and negative with exception of those listed in HPI  PMH:  Past Medical History:  Diagnosis Date   Essential hypertension  10/22/2020   GERD without esophagitis 10/22/2020   Memory impairment    Mixed diabetic hyperlipidemia associated with type 2 diabetes mellitus ([place]) 02/09/2021   Sepsis due to urinary tract infection ([place]) 10/22/2020   Type 2 diabetes mellitus with stage 3a chronic kidney disease, without long-term current use of insulin ([place]) 10/22/2020    PSH:  Past Surgical History:  Procedure Laterality Date   LAPAROSCOPIC GASTRIC BANDING  2011    Social History:  Social History   Socioeconomic History   Marital status: Unknown    Spouse name: Not on file   Number of children: Not on file   Years of education: Not on file   Highest education level: Not on file  Occupational History   Not on file  Tobacco Use   Smoking status: Former   Smokeless tobacco: Never  Substance and Sexual Activity   Alcohol use: Yes   Drug use: Not on file   Sexual activity: Not on file  Other Topics Concern   Not on file  Social History Narrative   Not on file   Social Determinants of Health   Financial Resource Strain: Not on file  Food Insecurity: Not on file  Transportation Needs: Not on file  Physical Activity: Not on file  Stress: Not on file  Social Connections: Not on file  Intimate Partner Violence: Not on file    Family History:  Family History  Family  history unknown: Yes    Medications:   Current Outpatient Medications on File Prior to Visit  Medication Sig Dispense Refill   amLODipine (NORVASC) 10 MG tablet Take 1 tablet (10 mg total) by mouth daily.     atorvastatin (LIPITOR) 40 MG tablet Take 1 tablet (40 mg total) by mouth daily.     clopidogrel (PLAVIX) 75 MG tablet Take 1 tablet (75 mg total) by mouth daily. Continue indefinately     Ferrous Sulfate Dried (HIGH POTENCY IRON) 65 MG TABS Take by mouth daily.     hydrALAZINE (APRESOLINE) 25 MG tablet Take 1 tablet (25 mg total) by mouth every 8 (eight) hours.     lisinopril (ZESTRIL) 20 MG tablet Take 20 mg by mouth daily.      oxybutynin (DITROPAN) 5 MG tablet Take 5 mg by mouth daily.     pantoprazole (PROTONIX) 40 MG tablet Take 40 mg by mouth daily.     PARoxetine (PAXIL) 20 MG tablet Take 20 mg by mouth daily.     polyethylene glycol (MIRALAX / GLYCOLAX) 17 g packet Take 17 g by mouth daily as needed for mild constipation. 14 each 0   polyvinyl alcohol (LIQUIFILM TEARS) 1.4 % ophthalmic solution Place 1 drop into both eyes as needed for dry eyes. 15 mL 0   traZODone (DESYREL) 100 MG tablet Take 100 mg by mouth at bedtime.     No current facility-administered medications on file prior to visit.    Allergies:  No Known Allergies    OBJECTIVE:  Physical Exam  Vitals:   03/28/21 0908  BP: 129/61  Pulse: 71  Weight: 173 lb (78.5 kg)  Height: 4\' 9"  (1.448 m)   Body mass index is 37.44 kg/m. No results found.   Post stroke PHQ 2/9 Depression screen PHQ 2/9 03/28/2021  Decreased Interest 0  Down, Depressed, Hopeless 0  PHQ - 2 Score 0     General: well developed, well nourished, very pleasant elderly Caucasian female, seated, in no evident distress Head: head normocephalic and atraumatic.   Neck: supple with no carotid or supraclavicular bruits Cardiovascular: regular rate and rhythm, no murmurs Musculoskeletal: no deformity Skin:  no rash/petichiae Vascular:  Normal pulses all extremities   Neurologic Exam Mental Status: Awake and fully alert.  Mild aphasia although limited conversation.  Follows commands without difficulty.  Disoriented to place and time. Recent and remote memory impaired. Attention span, concentration and fund of knowledge impaired with daughter providing history. Mood and affect appropriate.  Cranial Nerves: Fundoscopic exam reveals sharp disc margins. Pupils equal, briskly reactive to light. Extraocular movements full without nystagmus. Visual fields full to confrontation. Hearing intact. Facial sensation intact. Face, tongue, palate moves normally and symmetrically.  Motor:  Normal bulk and tone. Normal strength in all tested extremity muscles except mild RUE weakness and grip strength Sensory.: intact to touch , pinprick , position and vibratory sensation.  Coordination: Rapid alternating movements normal in all extremities except slightly decreased right hand dexterity. Finger-to-nose and heel-to-shin performed accurately bilaterally. Gait and Station: Arises from chair without difficulty. Stance is normal. Gait demonstrates decreased stride length and mild unsteadiness with use of Rollator walker.  Tandem walk and heel toe not attempted. Reflexes: 1+ and symmetric. Toes downgoing.       NIHSS  1 Modified Rankin  3-4      ASSESSMENT: Lindsay Martinez is a 87 y.o. year old female with recent right MCA/PCA punctate, bilateral MCA/ACA and bilateral ACA infarcts secondary to  intracranial stenosis with bilateral M2 stenosis likely in setting of dehydration and hypotension on 01/29/2021 after presenting with confusion, altered mental status and waxing and waning speech difficulty. Vascular risk factors include HTN, HLD, DM, and dementia     PLAN:  Bilateral cerebral strokes, watershed distribution:  Residual deficit: cognitive impairment, gait impairment and mild RUE weakness.  Recently restarted therapies with hopeful further improvement of residual deficits.  Continue to use Rollator walker at all times for fall prevention unless otherwise instructed.  Agree with increased supervision/assistance during the interval time Needs to complete 30 day cardiac monitor.  Miscommunication regarding monitor during acute rehab SNF admission - will reach out to cardiology to further assist Continue aspirin 325 mg daily and clopidogrel 75 mg daily for total of 3 months then Plavix alone (was taking aspirin PTA) and continue atorvastatin for secondary stroke prevention.   Discussed secondary stroke prevention measures and importance of close PCP follow up for aggressive stroke  risk factor management. I have gone over the pathophysiology of stroke, warning signs and symptoms, risk factors and their management in some detail with instructions to go to the closest emergency room for symptoms of concern. HTN: BP goal 130-150 to ensure adequate perfusion.  Stable on current regimen per PCP HLD: LDL goal <70. Recent direct LDL 64.4.  Continue atorvastatin 40 mg daily per PCP DMII: A1c goal<7.0. Recent A1c 6.2.  Continue current treatment regimen per PCP    Follow up in 3 months or call earlier if needed   CC:  [facility] provider: Dr. Leonie Man PCP: Kristen Loader, FNP    I spent 58 minutes of face-to-face and non-face-to-face time with patient and daughter.  This included previsit chart review including review of recent hospitalization, lab review, study review, order entry, electronic health record documentation, patient and daughter education and discussion regarding recent stroke including potential etiology and further evaluation with cardiac monitor and indication, secondary stroke prevention measures and importance of managing stroke risk factors, residual deficits and typical recovery time and answered all other questions to patient and daughters satisfaction  Frann Rider, AGNP-BC  [facility] [address] [place],  [address]  Phone [phone] Fax [phone] Note: This document was prepared with digital dictation and possible smart phrase technology. Any transcriptional errors that result from this process are unintentional.

## 2021-03-29 ENCOUNTER — Other Ambulatory Visit: Payer: Self-pay

## 2021-03-29 ENCOUNTER — Emergency Department (HOSPITAL_COMMUNITY): Payer: Medicare Other

## 2021-03-29 ENCOUNTER — Telehealth: Payer: Self-pay

## 2021-03-29 ENCOUNTER — Inpatient Hospital Stay (HOSPITAL_COMMUNITY): Payer: Medicare Other

## 2021-03-29 ENCOUNTER — Inpatient Hospital Stay (HOSPITAL_COMMUNITY)
Admit: 2021-03-29 | Discharge: 2021-03-29 | Disposition: A | Discharge status: Home / Self Care | Payer: Medicare Other | Attending: Neurology | Admitting: Neurology

## 2021-03-29 DIAGNOSIS — Z741 Need for assistance with personal care: Secondary | ICD-10-CM | POA: Diagnosis not present

## 2021-03-29 DIAGNOSIS — M6281 Muscle weakness (generalized): Secondary | ICD-10-CM | POA: Diagnosis not present

## 2021-03-29 DIAGNOSIS — R269 Unspecified abnormalities of gait and mobility: Secondary | ICD-10-CM | POA: Diagnosis present

## 2021-03-29 DIAGNOSIS — M6259 Muscle wasting and atrophy, not elsewhere classified, multiple sites: Secondary | ICD-10-CM | POA: Diagnosis not present

## 2021-03-29 DIAGNOSIS — Z20822 Contact with and (suspected) exposure to covid-19: Secondary | ICD-10-CM | POA: Diagnosis present

## 2021-03-29 DIAGNOSIS — R197 Diarrhea, unspecified: Secondary | ICD-10-CM

## 2021-03-29 DIAGNOSIS — R2681 Unsteadiness on feet: Secondary | ICD-10-CM | POA: Diagnosis not present

## 2021-03-29 DIAGNOSIS — S0003XA Contusion of scalp, initial encounter: Secondary | ICD-10-CM | POA: Diagnosis present

## 2021-03-29 DIAGNOSIS — I6782 Cerebral ischemia: Secondary | ICD-10-CM | POA: Diagnosis present

## 2021-03-29 DIAGNOSIS — F39 Unspecified mood [affective] disorder: Secondary | ICD-10-CM | POA: Diagnosis present

## 2021-03-29 DIAGNOSIS — R4701 Aphasia: Secondary | ICD-10-CM | POA: Diagnosis not present

## 2021-03-29 DIAGNOSIS — Z9884 Bariatric surgery status: Secondary | ICD-10-CM | POA: Diagnosis not present

## 2021-03-29 DIAGNOSIS — E1122 Type 2 diabetes mellitus with diabetic chronic kidney disease: Secondary | ICD-10-CM | POA: Diagnosis not present

## 2021-03-29 DIAGNOSIS — I4891 Unspecified atrial fibrillation: Secondary | ICD-10-CM | POA: Diagnosis not present

## 2021-03-29 DIAGNOSIS — D649 Anemia, unspecified: Secondary | ICD-10-CM

## 2021-03-29 DIAGNOSIS — I69331 Monoplegia of upper limb following cerebral infarction affecting right dominant side: Secondary | ICD-10-CM | POA: Diagnosis not present

## 2021-03-29 DIAGNOSIS — R4189 Other symptoms and signs involving cognitive functions and awareness: Secondary | ICD-10-CM | POA: Diagnosis present

## 2021-03-29 DIAGNOSIS — W1830XA Fall on same level, unspecified, initial encounter: Secondary | ICD-10-CM | POA: Diagnosis present

## 2021-03-29 DIAGNOSIS — I10 Essential (primary) hypertension: Secondary | ICD-10-CM | POA: Diagnosis not present

## 2021-03-29 DIAGNOSIS — I129 Hypertensive chronic kidney disease with stage 1 through stage 4 chronic kidney disease, or unspecified chronic kidney disease: Secondary | ICD-10-CM | POA: Diagnosis present

## 2021-03-29 DIAGNOSIS — Y92128 Other place in nursing home as the place of occurrence of the external cause: Secondary | ICD-10-CM | POA: Diagnosis not present

## 2021-03-29 DIAGNOSIS — I69398 Other sequelae of cerebral infarction: Secondary | ICD-10-CM | POA: Diagnosis not present

## 2021-03-29 DIAGNOSIS — E785 Hyperlipidemia, unspecified: Secondary | ICD-10-CM | POA: Diagnosis present

## 2021-03-29 DIAGNOSIS — N1832 Chronic kidney disease, stage 3b: Secondary | ICD-10-CM | POA: Diagnosis present

## 2021-03-29 DIAGNOSIS — N3281 Overactive bladder: Secondary | ICD-10-CM | POA: Diagnosis present

## 2021-03-29 DIAGNOSIS — F0390 Unspecified dementia without behavioral disturbance: Secondary | ICD-10-CM | POA: Diagnosis present

## 2021-03-29 DIAGNOSIS — F32A Depression, unspecified: Secondary | ICD-10-CM | POA: Diagnosis not present

## 2021-03-29 DIAGNOSIS — K219 Gastro-esophageal reflux disease without esophagitis: Secondary | ICD-10-CM | POA: Diagnosis not present

## 2021-03-29 DIAGNOSIS — D631 Anemia in chronic kidney disease: Secondary | ICD-10-CM | POA: Diagnosis present

## 2021-03-29 DIAGNOSIS — R2689 Other abnormalities of gait and mobility: Secondary | ICD-10-CM | POA: Diagnosis not present

## 2021-03-29 DIAGNOSIS — I639 Cerebral infarction, unspecified: Secondary | ICD-10-CM

## 2021-03-29 DIAGNOSIS — R296 Repeated falls: Secondary | ICD-10-CM | POA: Diagnosis present

## 2021-03-29 DIAGNOSIS — I672 Cerebral atherosclerosis: Secondary | ICD-10-CM | POA: Diagnosis present

## 2021-03-29 DIAGNOSIS — R279 Unspecified lack of coordination: Secondary | ICD-10-CM | POA: Diagnosis not present

## 2021-03-29 DIAGNOSIS — R4781 Slurred speech: Secondary | ICD-10-CM | POA: Diagnosis present

## 2021-03-29 DIAGNOSIS — I6381 Other cerebral infarction due to occlusion or stenosis of small artery: Secondary | ICD-10-CM | POA: Diagnosis present

## 2021-03-29 DIAGNOSIS — E669 Obesity, unspecified: Secondary | ICD-10-CM | POA: Diagnosis present

## 2021-03-29 DIAGNOSIS — Z6837 Body mass index (BMI) 37.0-37.9, adult: Secondary | ICD-10-CM | POA: Diagnosis not present

## 2021-03-29 DIAGNOSIS — R262 Difficulty in walking, not elsewhere classified: Secondary | ICD-10-CM | POA: Diagnosis not present

## 2021-03-29 DIAGNOSIS — E1169 Type 2 diabetes mellitus with other specified complication: Secondary | ICD-10-CM | POA: Diagnosis present

## 2021-03-29 LAB — COMPREHENSIVE METABOLIC PANEL
ALT: 13 U/L (ref 0–44)
AST: 17 U/L (ref 15–41)
Albumin: 3.5 g/dL (ref 3.5–5.0)
Alkaline Phosphatase: 62 U/L (ref 38–126)
Anion gap: 9 (ref 5–15)
BUN: 23 mg/dL (ref 8–23)
CO2: 21 mmol/L — ABNORMAL LOW (ref 22–32)
Calcium: 9.9 mg/dL (ref 8.9–10.3)
Chloride: 104 mmol/L (ref 98–111)
Creatinine, Ser: 1.4 mg/dL — ABNORMAL HIGH (ref 0.44–1.00)
GFR, Estimated: 36 mL/min — ABNORMAL LOW (ref >60)
Glucose, Bld: 108 mg/dL — ABNORMAL HIGH (ref 70–99)
Potassium: 4.1 mmol/L (ref 3.5–5.1)
Sodium: 134 mmol/L — ABNORMAL LOW (ref 135–145)
Total Bilirubin: 0.7 mg/dL (ref 0.3–1.2)
Total Protein: 6.2 g/dL — ABNORMAL LOW (ref 6.5–8.1)

## 2021-03-29 LAB — PROTIME-INR
INR: 1 (ref 0.8–1.2)
Prothrombin Time: 13.1 seconds (ref 11.4–15.2)

## 2021-03-29 LAB — RETICULOCYTES
Immature Retic Fract: 37.9 % — ABNORMAL HIGH (ref 2.3–15.9)
RBC.: 3.05 MIL/uL — ABNORMAL LOW (ref 3.87–5.11)
Retic Count, Absolute: 109.2 10*3/uL (ref 19.0–186.0)
Retic Ct Pct: 3.6 % — ABNORMAL HIGH (ref 0.4–3.1)

## 2021-03-29 LAB — CBC WITH DIFFERENTIAL/PLATELET
Abs Immature Granulocytes: 0.16 10*3/uL — ABNORMAL HIGH (ref 0.00–0.07)
Basophils Absolute: 0 10*3/uL (ref 0.0–0.1)
Basophils Relative: 1 %
Eosinophils Absolute: 0.3 10*3/uL (ref 0.0–0.5)
Eosinophils Relative: 4 %
HCT: 26.8 % — ABNORMAL LOW (ref 36.0–46.0)
Hemoglobin: 7.9 g/dL — ABNORMAL LOW (ref 12.0–15.0)
Immature Granulocytes: 2 %
Lymphocytes Relative: 24 %
Lymphs Abs: 1.9 10*3/uL (ref 0.7–4.0)
MCH: 25.5 pg — ABNORMAL LOW (ref 26.0–34.0)
MCHC: 29.5 g/dL — ABNORMAL LOW (ref 30.0–36.0)
MCV: 86.5 fL (ref 80.0–100.0)
Monocytes Absolute: 0.8 10*3/uL (ref 0.1–1.0)
Monocytes Relative: 10 %
Neutro Abs: 5 10*3/uL (ref 1.7–7.7)
Neutrophils Relative %: 59 %
Platelets: 392 10*3/uL (ref 150–400)
RBC: 3.1 MIL/uL — ABNORMAL LOW (ref 3.87–5.11)
RDW: 18 % — ABNORMAL HIGH (ref 11.5–15.5)
WBC: 8.2 10*3/uL (ref 4.0–10.5)
nRBC: 0 % (ref 0.0–0.2)

## 2021-03-29 LAB — URINALYSIS, ROUTINE W REFLEX MICROSCOPIC
Bilirubin Urine: NEGATIVE
Glucose, UA: NEGATIVE mg/dL
Hgb urine dipstick: NEGATIVE
Ketones, ur: NEGATIVE mg/dL
Leukocytes,Ua: NEGATIVE
Nitrite: NEGATIVE
Protein, ur: NEGATIVE mg/dL
Specific Gravity, Urine: 1.003 — ABNORMAL LOW (ref 1.005–1.030)
pH: 5 (ref 5.0–8.0)

## 2021-03-29 LAB — IRON AND TIBC
Iron: 22 ug/dL — ABNORMAL LOW (ref 28–170)
Saturation Ratios: 6 % — ABNORMAL LOW (ref 10.4–31.8)
TIBC: 372 ug/dL (ref 250–450)
UIBC: 350 ug/dL

## 2021-03-29 LAB — FOLATE: Folate: 5.1 ng/mL — ABNORMAL LOW (ref >5.9)

## 2021-03-29 LAB — MAGNESIUM: Magnesium: 1.9 mg/dL (ref 1.7–2.4)

## 2021-03-29 LAB — VITAMIN B12: Vitamin B-12: 194 pg/mL (ref 180–914)

## 2021-03-29 LAB — TROPONIN I (HIGH SENSITIVITY)
Troponin I (High Sensitivity): 9 ng/L (ref <18)
Troponin I (High Sensitivity): 9 ng/L (ref <18)

## 2021-03-29 LAB — BRAIN NATRIURETIC PEPTIDE: B Natriuretic Peptide: 63.1 pg/mL (ref 0.0–100.0)

## 2021-03-29 LAB — SARS CORONAVIRUS 2 (TAT 6-24 HRS): SARS Coronavirus 2: NEGATIVE

## 2021-03-29 LAB — FERRITIN: Ferritin: 21 ng/mL (ref 11–307)

## 2021-03-29 MED ORDER — PANTOPRAZOLE SODIUM 40 MG PO TBEC
40.0000 mg | DELAYED_RELEASE_TABLET | Freq: Every day | ORAL | Status: DC
  Administered 2021-03-29 – 2021-03-30 (×2): 40 mg via ORAL
  Filled 2021-03-29 (×2): qty 1

## 2021-03-29 MED ORDER — TRAZODONE HCL 50 MG PO TABS
50.0000 mg | ORAL_TABLET | Freq: Every day | ORAL | Status: DC
  Administered 2021-03-29: 50 mg via ORAL
  Filled 2021-03-29: qty 1

## 2021-03-29 MED ORDER — PAROXETINE HCL 20 MG PO TABS
20.0000 mg | ORAL_TABLET | Freq: Every day | ORAL | Status: DC
  Administered 2021-03-29 – 2021-03-30 (×2): 20 mg via ORAL
  Filled 2021-03-29 (×3): qty 1

## 2021-03-29 MED ORDER — ATORVASTATIN CALCIUM 10 MG PO TABS
10.0000 mg | ORAL_TABLET | Freq: Every day | ORAL | Status: DC
  Administered 2021-03-29 – 2021-03-30 (×2): 10 mg via ORAL
  Filled 2021-03-29 (×2): qty 1

## 2021-03-29 MED ORDER — ACETAMINOPHEN 160 MG/5ML PO SOLN: 650.0000 mg | ORAL | Status: DC | PRN

## 2021-03-29 MED ORDER — ACETAMINOPHEN 650 MG RE SUPP: 650.0000 mg | RECTAL | Status: DC | PRN

## 2021-03-29 MED ORDER — STROKE: EARLY STAGES OF RECOVERY BOOK
Freq: Once | Status: AC
  Filled 2021-03-29: qty 1

## 2021-03-29 MED ORDER — ACETAMINOPHEN 325 MG PO TABS: 650.0000 mg | ORAL_TABLET | ORAL | Status: DC | PRN

## 2021-03-29 MED ORDER — OXYBUTYNIN CHLORIDE 5 MG PO TABS
5.0000 mg | ORAL_TABLET | Freq: Two times a day (BID) | ORAL | Status: DC
  Administered 2021-03-29 – 2021-03-30 (×3): 5 mg via ORAL
  Filled 2021-03-29 (×3): qty 1

## 2021-03-29 NOTE — Progress Notes (Signed)
Nb:-Patient has another medical record number [id number]  87 year old [facility] resident female, CKD 3a DM TY 2 HTN reflux obesity BMI 37 gastric banding 2011, mild dementia Recent hospitalization for presumed cardioembolic CVA-DC aspirin Plavix supposed to have 30-day monitor Several mechanical falls facility prompted ED work-up Hemoglobin found to be 7 baseline 9-11 Creatinine 1.4 CT head = small right temporal scalp hematoma no acute injury X-ray of elbow negative for acute injury CT spine negative for acute findings Brain MRI = acute ischemia left cingulate gyrus  Neurology saw the patient recommended circumscribed work-up with MRA angiogram US carotid Doppler given deficits of mild aphasia Also recommending ASA 81 Plavix 70 5X 90 days and then aspirin alone  On exam BP (!) 132/47   Pulse 67   Temp 97.6 F (36.4 C) (Oral)   Resp 17   Ht 4\' 9"  (1.448 m)   Wt 78 kg   SpO2 97%   BMI 37.22 kg/m  She is awake but slightly confused thinks that her stroke is going to happen in 24 hours and slurring some of her words Power 5/5 bilaterally I did not test cerebellar signs, smile is symmetric shrug is bilaterally equal Sensory is grossly intact   P Await work-up for stroke await therapy services input and probably will discharge in 24 to 48 hours  No charge  Verneita Griffes, MD [facility] 11:00 AM

## 2021-03-29 NOTE — ED Notes (Signed)
Patient transported to MRI 

## 2021-03-29 NOTE — ED Notes (Signed)
Patient transported from MRI 

## 2021-03-29 NOTE — Telephone Encounter (Signed)
Yes, daughter did call our office this afternoon to let us know.  Apparently, she fell once she left yesterday visit (but no one in the office was notified) and then fell a few more times at facility.  I am glad to see that they brought her to the emergency room for further evaluation and even more pertinent that cardiac monitor is completed as she had another bilateral stroke. Thank you for reaching out to let me know!

## 2021-03-29 NOTE — Telephone Encounter (Signed)
Great. Thank you so much for your help!! Greatly appreciate it!

## 2021-03-29 NOTE — ED Notes (Signed)
Water provided to pt and family.

## 2021-03-29 NOTE — Progress Notes (Signed)
Orthopedic Tech Progress Note Patient Details:  Lindsay Martinez 02-12-1931 [id number]  Patient ID: Drenda Freeze, female   DOB: 10/22/1930, 87 y.o.   MRN: [id number] Attended trauma page Karolee Stamps 03/29/2021, 7:31 AM

## 2021-03-29 NOTE — H&P (Signed)
History and Physical    Lindsay Martinez GUR:[id number] DOB: June 21, 1931 DOA: 03/28/2021  PCP: Kristen Loader, FNP Patient coming from: Nursing home  Chief Complaint: Multiple falls  HPI: Lindsay Martinez is a 87 y.o. 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  History of hypertension, hyperlipidemia, type 2 diabetes, GERD.  Patient presents to the ED from her nursing home for evaluation of multiple mechanical falls over the last few days.  Yesterday she had a fall again and hit her head.  Noted to have a large area of ecchymosis on her left upper extremity.  Labs notable for hemoglobin 7.9, MCV 86.5.  Baseline hemoglobin in the 9-11 range.  Creatinine 1.4, stable.  Troponin negative x2.  UA not suggestive of infection.  BNP normal.  COVID test pending.  No traumatic injuries identified on x-rays of chest, pelvis, and left elbow.  CT head showing small right temporal scalp hematoma; no acute intracranial injury.  Interval evolution of multiple small left frontal cortical infarcts.  CT C-spine negative for acute finding.  Brain MRI showing acute ischemia in the left cingulate gyrus.  Expected evolution of subacute bilateral corona radiata infarcts. Neurology consulted.  Patient states she fell twice yesterday, once in the hallway and once next to her bed.  She injured her head and has a big bruise next to her left elbow.  She thinks she is falling due to her losing balance.  Denies preceding dizziness, chest pain, or shortness of breath.  She is also having diarrhea.  Not sure if she has been on antibiotics recently.  Denies fevers, nausea, vomiting, or abdominal pain.  Review of Systems:  All systems reviewed and apart from history of presenting illness, are negative.  Patient has a duplicate chart marked for merge.  PMH:      Past Medical History:  Diagnosis Date   Essential hypertension 10/22/2020   GERD  without esophagitis 10/22/2020   Memory impairment     Mixed diabetic hyperlipidemia associated with type 2 diabetes mellitus ([place]) 02/09/2021   Sepsis due to urinary tract infection ([place]) 10/22/2020   Type 2 diabetes mellitus with stage 3a chronic kidney disease, without long-term current use of insulin ([place]) 10/22/2020      PSH:       Past Surgical History:  Procedure Laterality Date   LAPAROSCOPIC GASTRIC BANDING   2011      Social History:  Social History         Socioeconomic History   Marital status: Unknown      Spouse name: Not on file   Number of children: Not on file   Years of education: Not on file   Highest education level: Not on file  Occupational History   Not on file  Tobacco Use   Smoking status: Former   Smokeless tobacco: Never  Substance and Sexual Activity   Alcohol use: Yes   Drug use: Not on file   Sexual activity: Not on file  Other Topics Concern   Not on file  Social History Narrative   Not on file    Allergies: NKDA  Family history: No pertinent family history.  Prior to Admission medications   Medication Sig Start Date End Date Taking? Authorizing Provider  atorvastatin (LIPITOR) 10 MG tablet Take 10 mg by mouth daily. 12/31/20  Yes [provider]  clopidogrel (PLAVIX) 75 MG tablet Take 75 mg by mouth daily. 03/07/21  Yes  [provider]  ferrous sulfate 324 MG TBEC Take 324 mg by mouth in the morning and at bedtime.   Yes [provider]  hydrALAZINE (APRESOLINE) 25 MG tablet Take 25 mg by mouth in the morning and at bedtime. 02/14/21  Yes [provider]  lisinopril (ZESTRIL) 20 MG tablet Take 20 mg by mouth daily. 12/31/20  Yes [provider]  oxybutynin (DITROPAN) 5 MG tablet Take 5 mg by mouth in the morning and at bedtime. 02/14/21  Yes [provider]  pantoprazole (PROTONIX) 40 MG tablet Take 40 mg by mouth daily. 02/14/21  Yes [provider]  PARoxetine (PAXIL) 20 MG tablet  Take 20 mg by mouth daily. 02/14/21  Yes [provider]  PROCTOZONE-HC 2.5 % rectal cream Place 1 application rectally daily as needed for hemorrhoids. 02/25/21  Yes [provider]  traZODone (DESYREL) 100 MG tablet Take 50 mg by mouth at bedtime. 02/14/21  Yes [provider]  amLODipine (NORVASC) 10 MG tablet Take 10 mg by mouth daily. Patient not taking: Reported on 03/29/2021 02/14/21   [provider]    Physical Exam: Vitals:   03/29/21 0100 03/29/21 0127 03/29/21 0130 03/29/21 0313  BP: (!) 150/81  (!) 153/54 (!) 146/99  Pulse: 80  (!) 104 78  Resp: (!) 27  14 17   Temp:    97.6 F (36.4 C)  TempSrc:    Oral  SpO2: 100%  100% 97%  Weight:  78 kg    Height:  4\' 9"  (1.448 m)      Physical Exam Constitutional:      General: She is not in acute distress. HENT:     Head: Normocephalic and atraumatic.  Eyes:     Extraocular Movements: Extraocular movements intact.     Conjunctiva/sclera: Conjunctivae normal.  Cardiovascular:     Rate and Rhythm: Normal rate and regular rhythm.     Pulses: Normal pulses.  Pulmonary:     Effort: Pulmonary effort is normal. No respiratory distress.     Breath sounds: Normal breath sounds. No wheezing or rales.  Abdominal:     General: Bowel sounds are normal. There is no distension.     Palpations: Abdomen is soft.     Tenderness: There is no abdominal tenderness. There is no guarding or rebound.  Musculoskeletal:        General: No swelling or tenderness.     Cervical back: Normal range of motion and neck supple.  Skin:    General: Skin is warm and dry.     Comments: Large area of ecchymosis on the medial aspect of the left upper extremity around elbow  Neurological:     Mental Status: She is alert and oriented to person, place, and time.     Sensory: No sensory deficit.     Motor: No weakness.     Comments: Mild expressive aphasia     Labs on Admission: I have personally reviewed following labs and  imaging studies  CBC: Recent Labs  Lab 03/28/21 2351  WBC 8.2  NEUTROABS 5.0  HGB 7.9*  HCT 26.8*  MCV 86.5  PLT 030   Basic Metabolic Panel: Recent Labs  Lab 03/28/21 2351  NA 134*  K 4.1  CL 104  CO2 21*  GLUCOSE 108*  BUN 23  CREATININE 1.40*  CALCIUM 9.9  MG 1.9   GFR: Estimated Creatinine Clearance: 23.9 mL/min (A) (by C-G formula based on SCr of 1.4 mg/dL (H)). Liver Function  Tests: Recent Labs  Lab 03/28/21 2351  AST 17  ALT 13  ALKPHOS 62  BILITOT 0.7  PROT 6.2*  ALBUMIN 3.5   No results for input(s): LIPASE, AMYLASE in the last 168 hours. No results for input(s): AMMONIA in the last 168 hours. Coagulation Profile: Recent Labs  Lab 03/28/21 2351  INR 1.0   Cardiac Enzymes: No results for input(s): CKTOTAL, CKMB, CKMBINDEX, TROPONINI in the last 168 hours. BNP (last 3 results) No results for input(s): PROBNP in the last 8760 hours. HbA1C: No results for input(s): HGBA1C in the last 72 hours. CBG: No results for input(s): GLUCAP in the last 168 hours. Lipid Profile: No results for input(s): CHOL, HDL, LDLCALC, TRIG, CHOLHDL, LDLDIRECT in the last 72 hours. Thyroid Function Tests: No results for input(s): TSH, T4TOTAL, FREET4, T3FREE, THYROIDAB in the last 72 hours. Anemia Panel: No results for input(s): VITAMINB12, FOLATE, FERRITIN, TIBC, IRON, RETICCTPCT in the last 72 hours. Urine analysis:    Component Value Date/Time   COLORURINE STRAW (A) 03/28/2021 2352   APPEARANCEUR HAZY (A) 03/28/2021 2352   LABSPEC 1.003 (L) 03/28/2021 2352   PHURINE 5.0 03/28/2021 2352   GLUCOSEU NEGATIVE 03/28/2021 2352   HGBUR NEGATIVE 03/28/2021 2352   BILIRUBINUR NEGATIVE 03/28/2021 2352   KETONESUR NEGATIVE 03/28/2021 2352   PROTEINUR NEGATIVE 03/28/2021 2352   NITRITE NEGATIVE 03/28/2021 2352   LEUKOCYTESUR NEGATIVE 03/28/2021 2352    Radiological Exams on Admission: DG Elbow Complete Left  Result Date: 03/29/2021 CLINICAL DATA:  Fall EXAM: LEFT  ELBOW - COMPLETE 3+ VIEW COMPARISON:  None. FINDINGS: No fracture or dislocation is seen. The joint spaces are preserved. The visualized soft tissues are unremarkable. Lateral view is obliqued. No definite displaced fat pads to suggest an elbow joint effusion. IMPRESSION: Negative. Electronically Signed   By: Julian Hy M.D.   On: 03/29/2021 01:01   CT Head Wo Contrast  Result Date: 03/29/2021 CLINICAL DATA:  Head trauma, minor (Age >= 65y); Neck trauma (Age >= 65y). Multiple falls, head injury, chronic anticoagulation EXAM: CT HEAD WITHOUT CONTRAST CT CERVICAL SPINE WITHOUT CONTRAST TECHNIQUE: Multidetector CT imaging of the head and cervical spine was performed following the standard protocol without intravenous contrast. Multiplanar CT image reconstructions of the cervical spine were also generated. COMPARISON:  CT and MRI head 02/08/2021 FINDINGS: CT HEAD FINDINGS Brain: Interval evolution of multiple cortical infarcts within the left frontal lobe since prior examination. Mild parenchymal volume loss is commensurate with the patient's age. Mild periventricular white matter changes are present, most in keeping with the sequela of small vessel ischemia. No acute intracranial hemorrhage or infarct. No abnormal mass effect or midline shift. No abnormal intra or extra-axial mass lesion. Ventricular size is normal. The cerebellum is unremarkable. Vascular: No asymmetric hyperdense vasculature at the skull base Skull: Intact Sinuses/Orbits: The paranasal sinuses are clear. The orbits are unremarkable save for removal of the ocular lenses. Other: Mastoid air cells and middle ear cavities are clear. Small right temporal scalp hematoma. CT CERVICAL SPINE FINDINGS Alignment: Straightening of the cervical spine noted. 3 mm anterolisthesis of C2 upon C3 is likely degenerative in nature. Skull base and vertebrae: The craniocervical alignment is normal. The atlantodental interval is not widened. There is no acute  fracture of the cervical spine. Vertebral body height is preserved. Soft tissues and spinal canal: Posterior disc osteophyte complex ease in combination with congenital narrowing of the spinal canal result in moderate central canal stenosis at C5-6 and C6-7 with the AP diameter of  the spinal canal of approximately 6-7 mm at minimum with mild resultant flattening of the thecal sac. No canal hematoma. No prevertebral soft tissue swelling or fluid. No pathologic cervical adenopathy. The visualized thyroid is unremarkable peer Disc levels: There is intervertebral disc space narrowing and endplate remodeling of N8-T7 in keeping with changes of severe degenerative disc disease. Milder degenerative changes are noted at C2-3 and C7-T1. The prevertebral soft tissues are not thickened on sagittal reformats. Review of the axial images demonstrates multilevel uncovertebral and facet arthrosis resulting in multilevel neuroforaminal narrowing, most severe at C3-4 bilaterally, C4-5 on the right, and C5-6 bilaterally Upper chest: Negative. Other: None IMPRESSION: No acute intracranial injury. No calvarial fracture. Small right temporal scalp hematoma. Interval evolution of multiple small left frontal cortical infarcts. Stable senescent change No acute fracture or listhesis of the cervical spine. Advanced degenerative disc and degenerative joint disease resulting in moderate central canal stenosis and multilevel neuroforaminal narrowing as described above. Electronically Signed   By: Fidela Salisbury M.D.   On: 03/29/2021 00:49   CT Cervical Spine Wo Contrast  Result Date: 03/29/2021 CLINICAL DATA:  Head trauma, minor (Age >= 65y); Neck trauma (Age >= 65y). Multiple falls, head injury, chronic anticoagulation EXAM: CT HEAD WITHOUT CONTRAST CT CERVICAL SPINE WITHOUT CONTRAST TECHNIQUE: Multidetector CT imaging of the head and cervical spine was performed following the standard protocol without intravenous contrast. Multiplanar CT  image reconstructions of the cervical spine were also generated. COMPARISON:  CT and MRI head 02/08/2021 FINDINGS: CT HEAD FINDINGS Brain: Interval evolution of multiple cortical infarcts within the left frontal lobe since prior examination. Mild parenchymal volume loss is commensurate with the patient's age. Mild periventricular white matter changes are present, most in keeping with the sequela of small vessel ischemia. No acute intracranial hemorrhage or infarct. No abnormal mass effect or midline shift. No abnormal intra or extra-axial mass lesion. Ventricular size is normal. The cerebellum is unremarkable. Vascular: No asymmetric hyperdense vasculature at the skull base Skull: Intact Sinuses/Orbits: The paranasal sinuses are clear. The orbits are unremarkable save for removal of the ocular lenses. Other: Mastoid air cells and middle ear cavities are clear. Small right temporal scalp hematoma. CT CERVICAL SPINE FINDINGS Alignment: Straightening of the cervical spine noted. 3 mm anterolisthesis of C2 upon C3 is likely degenerative in nature. Skull base and vertebrae: The craniocervical alignment is normal. The atlantodental interval is not widened. There is no acute fracture of the cervical spine. Vertebral body height is preserved. Soft tissues and spinal canal: Posterior disc osteophyte complex ease in combination with congenital narrowing of the spinal canal result in moderate central canal stenosis at C5-6 and C6-7 with the AP diameter of the spinal canal of approximately 6-7 mm at minimum with mild resultant flattening of the thecal sac. No canal hematoma. No prevertebral soft tissue swelling or fluid. No pathologic cervical adenopathy. The visualized thyroid is unremarkable peer Disc levels: There is intervertebral disc space narrowing and endplate remodeling of R1-H6 in keeping with changes of severe degenerative disc disease. Milder degenerative changes are noted at C2-3 and C7-T1. The prevertebral soft  tissues are not thickened on sagittal reformats. Review of the axial images demonstrates multilevel uncovertebral and facet arthrosis resulting in multilevel neuroforaminal narrowing, most severe at C3-4 bilaterally, C4-5 on the right, and C5-6 bilaterally Upper chest: Negative. Other: None IMPRESSION: No acute intracranial injury. No calvarial fracture. Small right temporal scalp hematoma. Interval evolution of multiple small left frontal cortical infarcts. Stable senescent change No acute  fracture or listhesis of the cervical spine. Advanced degenerative disc and degenerative joint disease resulting in moderate central canal stenosis and multilevel neuroforaminal narrowing as described above. Electronically Signed   By: Fidela Salisbury M.D.   On: 03/29/2021 00:49   MR BRAIN WO CONTRAST  Result Date: 03/29/2021 CLINICAL DATA:  Multiple falls EXAM: MRI HEAD WITHOUT CONTRAST TECHNIQUE: Multiplanar, multiecho pulse sequences of the brain and surrounding structures were obtained without intravenous contrast. COMPARISON:  02/08/2021 FINDINGS: Brain: Expected evolution of diffusion signal changes within the bilateral corona radiata. There is a new area of abnormal diffusion restriction of the left cingulate gyrus. No acute or chronic hemorrhage. There is multifocal hyperintense T2-weighted signal within the white matter. Generalized volume loss without a clear lobar predilection. The midline structures are normal. Vascular: Major flow voids are preserved. Skull and upper cervical spine: Normal calvarium and skull base. Visualized upper cervical spine and soft tissues are normal. Sinuses/Orbits:No paranasal sinus fluid levels or advanced mucosal thickening. No mastoid or middle ear effusion. Normal orbits. IMPRESSION: 1. New area of abnormal diffusion restriction of the left cingulate gyrus, consistent with acute ischemia. No hemorrhage or mass effect. 2. Expected evolution of subacute bilateral corona radiata  infarcts. 3. Chronic small vessel ischemic disease and volume loss. Electronically Signed   By: Ulyses Jarred M.D.   On: 03/29/2021 03:16   DG Pelvis Portable  Result Date: 03/29/2021 CLINICAL DATA:  Fall, pelvic trauma EXAM: PORTABLE PELVIS 1-2 VIEWS COMPARISON:  None. FINDINGS: Normal alignment. No fracture or dislocation. Mild bilateral degenerative hip arthritis is present. Soft tissues are unremarkable. IMPRESSION: No acute fracture or dislocation. Electronically Signed   By: Fidela Salisbury M.D.   On: 03/29/2021 00:34   DG Chest Port 1 View  Result Date: 03/29/2021 CLINICAL DATA:  Fall, chest trauma EXAM: PORTABLE CHEST 1 VIEW COMPARISON:  None. FINDINGS: Lungs are clear. No pneumothorax or pleural effusion. Cardiac size is within normal limits. Pulmonary vascularity is normal. Surgical clips are seen within the right axilla. No acute bone abnormality. IMPRESSION: No active disease. Electronically Signed   By: Fidela Salisbury M.D.   On: 03/29/2021 00:34    EKG: Independently reviewed.  Sinus arrhythmia, no significant change since prior tracing.  Assessment/Plan Principal Problem:   Acute CVA (cerebrovascular accident) [facility]) Active Problems:   Diarrhea   Anemia   Essential hypertension   Hyperlipidemia   Acute CVA History of CVA in August 2022 suspicious for possible cardioembolic source.  Residual cognitive impairment, gait impairment, and mild RUE weakness.  Transthoracic echo did not show cardiac source of embolism.  Neurology had recommended 30-day event monitoring.  A1c 6.1 and LDL 64.  Patient was started on aspirin, Plavix, and Lipitor.  She returns to the ED now for evaluation of multiple recent falls.  Noted to have mild expressive aphasia.  Brain MRI showing acute ischemia in the left cingulate gyrus. -Neurology consulted, recommendations pending -Telemetry monitoring -Antiplatelet therapy recommendations per neurology. -Cardiology consulted for 30-day event monitor -Frequent  neurochecks -PT, OT, speech therapy -Fall precautions -N.p.o. until cleared by bedside swallow evaluation or formal speech evaluation   Diarrhea No nausea, vomiting, or abdominal pain.  No recent antibiotic use.  No fever or leukocytosis. -GI pathogen panel, enteric precautions  Acute on chronic anemia Hemoglobin 7.9, MCV 86.5.  Baseline hemoglobin in the 9-11 range.  Patient denies any symptoms of GI bleed. -Anemia panel  Hypertension -Allow permissive hypertension at this time  Hyperlipidemia -Continue Lipitor  Non-insulin-dependent type 2 diabetes  A1c 6.1 last month.  GERD -Continue Protonix  Overactive bladder -Continue oxybutynin  Mood disorder -Continue Paxil and trazodone  CKD stage IIIb Creatinine 1.4, stable.    DVT prophylaxis: SCDs Code Status: Patient wishes to be full code. Family Communication: No family available at this time. Disposition Plan: Status is: Inpatient  Remains inpatient appropriate because:Ongoing diagnostic testing needed not appropriate for outpatient work up and Inpatient level of care appropriate due to severity of illness  Dispo: The patient is from: SNF              Anticipated d/c is to: SNF              Patient currently is not medically stable to d/c.   Difficult to place patient No  Level of care: Level of care: Telemetry Medical  The medical decision making on this patient was of high complexity and the patient is at high risk for clinical deterioration, therefore this is a level 3 visit.  Shela Leff MD [facility]  If 7PM-7AM, please contact night-coverage [email or web address]  03/29/2021, 5:57 AM

## 2021-03-29 NOTE — ED Notes (Signed)
Patient transported to CT 

## 2021-03-29 NOTE — Progress Notes (Signed)
Admitted from ED.  Oriented to room.  Isolation explained to pt and the rationale as well.

## 2021-03-29 NOTE — Progress Notes (Signed)
Carotid duplex bilateral study completed.   Please see CV Proc for preliminary results.   Brieanne Mignone, RDMS, RVT  

## 2021-03-29 NOTE — Progress Notes (Signed)
STROKE TEAM PROGRESS NOTE   Patient has another medical record marked for merge INTERVAL HISTORY No acute events No visitors at bedside Patient does not recall events leading to return to hospital. She states she thinks she fell again. She asks if she has had another stroke. She converses slowly with mild aphasia.  We discussed her ongoing work up and plan of care. She did not have questions.  MRI scan shows new left cingulate gyrus infarct along with expected delusional changes from recent bilateral ACA infarcts. Vitals:   03/29/21 0630 03/29/21 0800 03/29/21 1202 03/29/21 1315  BP: 140/66 (!) 132/47 (!) 172/87 (!) 175/75  Pulse: 75 67 85 98  Resp: 20 17 (!) 21 15  Temp:   97.9 F (36.6 C)   TempSrc:   Oral   SpO2: 96% 97% 96% 100%  Weight:      Height:       CBC:  Recent Labs  Lab 03/28/21 2351  WBC 8.2  NEUTROABS 5.0  HGB 7.9*  HCT 26.8*  MCV 86.5  PLT 268   Basic Metabolic Panel:  Recent Labs  Lab 03/28/21 2351  NA 134*  K 4.1  CL 104  CO2 21*  GLUCOSE 108*  BUN 23  CREATININE 1.40*  CALCIUM 9.9  MG 1.9   Lipid Panel: No results for input(s): CHOL, TRIG, HDL, CHOLHDL, VLDL, LDLCALC in the last 168 hours. HgbA1c: No results for input(s): HGBA1C in the last 168 hours. Urine Drug Screen: No results for input(s): LABOPIA, COCAINSCRNUR, LABBENZ, AMPHETMU, THCU, LABBARB in the last 168 hours.  Alcohol Level No results for input(s): ETH in the last 168 hours.  IMAGING past 24 hours DG Elbow Complete Left  Result Date: 03/29/2021 CLINICAL DATA:  Fall EXAM: LEFT ELBOW - COMPLETE 3+ VIEW COMPARISON:  None. FINDINGS: No fracture or dislocation is seen. The joint spaces are preserved. The visualized soft tissues are unremarkable. Lateral view is obliqued. No definite displaced fat pads to suggest an elbow joint effusion. IMPRESSION: Negative. Electronically Signed   By: Julian Hy M.D.   On: 03/29/2021 01:01   CT Head Wo Contrast  Result Date:  03/29/2021 CLINICAL DATA:  Head trauma, minor (Age >= 65y); Neck trauma (Age >= 65y). Multiple falls, head injury, chronic anticoagulation EXAM: CT HEAD WITHOUT CONTRAST CT CERVICAL SPINE WITHOUT CONTRAST TECHNIQUE: Multidetector CT imaging of the head and cervical spine was performed following the standard protocol without intravenous contrast. Multiplanar CT image reconstructions of the cervical spine were also generated. COMPARISON:  CT and MRI head 02/08/2021 FINDINGS: CT HEAD FINDINGS Brain: Interval evolution of multiple cortical infarcts within the left frontal lobe since prior examination. Mild parenchymal volume loss is commensurate with the patient's age. Mild periventricular white matter changes are present, most in keeping with the sequela of small vessel ischemia. No acute intracranial hemorrhage or infarct. No abnormal mass effect or midline shift. No abnormal intra or extra-axial mass lesion. Ventricular size is normal. The cerebellum is unremarkable. Vascular: No asymmetric hyperdense vasculature at the skull base Skull: Intact Sinuses/Orbits: The paranasal sinuses are clear. The orbits are unremarkable save for removal of the ocular lenses. Other: Mastoid air cells and middle ear cavities are clear. Small right temporal scalp hematoma. CT CERVICAL SPINE FINDINGS Alignment: Straightening of the cervical spine noted. 3 mm anterolisthesis of C2 upon C3 is likely degenerative in nature. Skull base and vertebrae: The craniocervical alignment is normal. The atlantodental interval is not widened. There is no acute fracture of the cervical  spine. Vertebral body height is preserved. Soft tissues and spinal canal: Posterior disc osteophyte complex ease in combination with congenital narrowing of the spinal canal result in moderate central canal stenosis at C5-6 and C6-7 with the AP diameter of the spinal canal of approximately 6-7 mm at minimum with mild resultant flattening of the thecal sac. No canal  hematoma. No prevertebral soft tissue swelling or fluid. No pathologic cervical adenopathy. The visualized thyroid is unremarkable peer Disc levels: There is intervertebral disc space narrowing and endplate remodeling of Y6-A6 in keeping with changes of severe degenerative disc disease. Milder degenerative changes are noted at C2-3 and C7-T1. The prevertebral soft tissues are not thickened on sagittal reformats. Review of the axial images demonstrates multilevel uncovertebral and facet arthrosis resulting in multilevel neuroforaminal narrowing, most severe at C3-4 bilaterally, C4-5 on the right, and C5-6 bilaterally Upper chest: Negative. Other: None IMPRESSION: No acute intracranial injury. No calvarial fracture. Small right temporal scalp hematoma. Interval evolution of multiple small left frontal cortical infarcts. Stable senescent change No acute fracture or listhesis of the cervical spine. Advanced degenerative disc and degenerative joint disease resulting in moderate central canal stenosis and multilevel neuroforaminal narrowing as described above. Electronically Signed   By: Fidela Salisbury M.D.   On: 03/29/2021 00:49   CT Cervical Spine Wo Contrast  Result Date: 03/29/2021 CLINICAL DATA:  Head trauma, minor (Age >= 65y); Neck trauma (Age >= 65y). Multiple falls, head injury, chronic anticoagulation EXAM: CT HEAD WITHOUT CONTRAST CT CERVICAL SPINE WITHOUT CONTRAST TECHNIQUE: Multidetector CT imaging of the head and cervical spine was performed following the standard protocol without intravenous contrast. Multiplanar CT image reconstructions of the cervical spine were also generated. COMPARISON:  CT and MRI head 02/08/2021 FINDINGS: CT HEAD FINDINGS Brain: Interval evolution of multiple cortical infarcts within the left frontal lobe since prior examination. Mild parenchymal volume loss is commensurate with the patient's age. Mild periventricular white matter changes are present, most in keeping with the  sequela of small vessel ischemia. No acute intracranial hemorrhage or infarct. No abnormal mass effect or midline shift. No abnormal intra or extra-axial mass lesion. Ventricular size is normal. The cerebellum is unremarkable. Vascular: No asymmetric hyperdense vasculature at the skull base Skull: Intact Sinuses/Orbits: The paranasal sinuses are clear. The orbits are unremarkable save for removal of the ocular lenses. Other: Mastoid air cells and middle ear cavities are clear. Small right temporal scalp hematoma. CT CERVICAL SPINE FINDINGS Alignment: Straightening of the cervical spine noted. 3 mm anterolisthesis of C2 upon C3 is likely degenerative in nature. Skull base and vertebrae: The craniocervical alignment is normal. The atlantodental interval is not widened. There is no acute fracture of the cervical spine. Vertebral body height is preserved. Soft tissues and spinal canal: Posterior disc osteophyte complex ease in combination with congenital narrowing of the spinal canal result in moderate central canal stenosis at C5-6 and C6-7 with the AP diameter of the spinal canal of approximately 6-7 mm at minimum with mild resultant flattening of the thecal sac. No canal hematoma. No prevertebral soft tissue swelling or fluid. No pathologic cervical adenopathy. The visualized thyroid is unremarkable peer Disc levels: There is intervertebral disc space narrowing and endplate remodeling of T0-Z6 in keeping with changes of severe degenerative disc disease. Milder degenerative changes are noted at C2-3 and C7-T1. The prevertebral soft tissues are not thickened on sagittal reformats. Review of the axial images demonstrates multilevel uncovertebral and facet arthrosis resulting in multilevel neuroforaminal narrowing, most severe at C3-4  bilaterally, C4-5 on the right, and C5-6 bilaterally Upper chest: Negative. Other: None IMPRESSION: No acute intracranial injury. No calvarial fracture. Small right temporal scalp hematoma.  Interval evolution of multiple small left frontal cortical infarcts. Stable senescent change No acute fracture or listhesis of the cervical spine. Advanced degenerative disc and degenerative joint disease resulting in moderate central canal stenosis and multilevel neuroforaminal narrowing as described above. Electronically Signed   By: Fidela Salisbury M.D.   On: 03/29/2021 00:49   MR ANGIO HEAD WO CONTRAST  Result Date: 03/29/2021 CLINICAL DATA:  Neuro deficit, acute, stroke suspected. EXAM: MRA HEAD WITHOUT CONTRAST TECHNIQUE: Angiographic images of the Circle of Willis were acquired using MRA technique without intravenous contrast. COMPARISON:  Same-day brain MRI 03/29/2021.  MRA head 02/08/2021. FINDINGS: Anterior circulation: The intracranial internal carotid arteries are patent. The M1 middle cerebral arteries are patent. No M2 proximal branch occlusion or high-grade proximal stenosis is identified. The anterior cerebral arteries are patent. Redemonstrated multifocal severe stenoses within the bilateral A2/A3 anterior cerebral arteries. 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 Redemonstrated 2 mm inferiorly projecting vascular protrusion arising from the supraclinoid left ICA, which may reflect an aneurysm or infundibulum. (Series 257, image 204). Posterior circulation: The intracranial vertebral arteries are patent. The right vertebral artery is dominant. The basilar artery is patent. The posterior cerebral arteries are patent. A right posterior communicating artery is present. The left posterior communicating artery is hypoplastic or absent. Anatomic variants: As described. IMPRESSION: Stable MRA of the head as compared 02/08/2021. Multifocal severe stenoses within the A2/A3 segments of the bilateral anterior cerebral arteries. 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 2 mm inferiorly  projecting vascular protrusion arising from the supraclinoid left ICA, which may reflect an aneurysm or infundibulum. Electronically Signed   By: Kellie Simmering D.O.   On: 03/29/2021 10:34   MR BRAIN WO CONTRAST  Result Date: 03/29/2021 CLINICAL DATA:  Multiple falls EXAM: MRI HEAD WITHOUT CONTRAST TECHNIQUE: Multiplanar, multiecho pulse sequences of the brain and surrounding structures were obtained without intravenous contrast. COMPARISON:  02/08/2021 FINDINGS: Brain: Expected evolution of diffusion signal changes within the bilateral corona radiata. There is a new area of abnormal diffusion restriction of the left cingulate gyrus. No acute or chronic hemorrhage. There is multifocal hyperintense T2-weighted signal within the white matter. Generalized volume loss without a clear lobar predilection. The midline structures are normal. Vascular: Major flow voids are preserved. Skull and upper cervical spine: Normal calvarium and skull base. Visualized upper cervical spine and soft tissues are normal. Sinuses/Orbits:No paranasal sinus fluid levels or advanced mucosal thickening. No mastoid or middle ear effusion. Normal orbits. IMPRESSION: 1. New area of abnormal diffusion restriction of the left cingulate gyrus, consistent with acute ischemia. No hemorrhage or mass effect. 2. Expected evolution of subacute bilateral corona radiata infarcts. 3. Chronic small vessel ischemic disease and volume loss. Electronically Signed   By: Ulyses Jarred M.D.   On: 03/29/2021 03:16   DG Pelvis Portable  Result Date: 03/29/2021 CLINICAL DATA:  Fall, pelvic trauma EXAM: PORTABLE PELVIS 1-2 VIEWS COMPARISON:  None. FINDINGS: Normal alignment. No fracture or dislocation. Mild bilateral degenerative hip arthritis is present. Soft tissues are unremarkable. IMPRESSION: No acute fracture or dislocation. Electronically Signed   By: Fidela Salisbury M.D.   On: 03/29/2021 00:34   DG Chest Port 1 View  Result Date: 03/29/2021 CLINICAL  DATA:  Fall, chest trauma  EXAM: PORTABLE CHEST 1 VIEW COMPARISON:  None. FINDINGS: Lungs are clear. No pneumothorax or pleural effusion. Cardiac size is within normal limits. Pulmonary vascularity is normal. Surgical clips are seen within the right axilla. No acute bone abnormality. IMPRESSION: No active disease. Electronically Signed   By: Fidela Salisbury M.D.   On: 03/29/2021 00:34    PHYSICAL EXAM Pleasant elderly lady not in distress. . Afebrile. Head is nontraumatic. Neck is supple without bruit.    Cardiac exam no murmur or gallop. Lungs are clear to auscultation. Distal pulses are well felt.  Neurological Exam : Awake alert oriented to time place and person.  Speech is slightly nonfluent hesitant but no dysarthria or aphasia.  Extraocular movements are full range without nystagmus.  Mild right lower facial asymmetry when she smiles.  Tongue midline.  Motor system exam shows no upper extremity or lower extremity drift but mild weakness of right grip intrinsic hand muscles as well as right hip flexors and ankle dorsiflexors.  Orbits left over right upper extremity.  Sensation is intact bilaterally.  Gait not tested. ASSESSMENT/PLAN Lauralynn Loeb is a 87 y.o. female with PMH significant for diabetes, stage III CKD, hypertension, hyperlipidemia, gastric banding (2011), memory impairment who was evaluated by our team last month for confusion, altered mental status, waxing and waning speech difficulty.  CT at that time showed left hemispheric infarct.  MRI showed right MCA/PCA punctate, bilateral MCA/ACA and ACA infarcts. Etiology for patient stroke was thought to be intracranial stenosis (b/l A2) in the setting of dehydration and hypotension on presentation. A1c 6.1 and LDL 64. Recommended aspirin 325 and Plavix 75 DAPT for 3 months and then aspirin alone given severe intracranial stenosis. A 30 day cardiac monitor was also recommended. She now arrives from her SNF for multiple reported mechanical falls  over the last several days. On the day of admission she had a mechanical fall in which it is reported she hit her head. MRI brain was obtained as part of workup and demonstrated a new left cingulate gyrus stroke and expected evolution of subacute infarcts noted on prior imaging. Neurology consulted for further workup for the noted strokes.  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 No acute intracranial injury. No calvarial fracture. Small right temporal scalp hematoma. Interval evolution of multiple small left frontal cortical infarcts. Stable senescent change No acute fracture or listhesis of the cervical spine. MRI   New area of abnormal diffusion restriction of the left cingulate gyrus, consistent with acute ischemia.  Expected evolution of subacute bilateral corona radiata infarcts. Chronic small vessel ischemic disease and volume loss. 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  Overall stable changes compared with previous MRI a 02/08/2021 2D Echo not ordered  LDL last month 64. HgbA1c 6.1 last month  VTE prophylaxis - recommended, per primary team     Diet   Diet heart healthy/carb modified Room service appropriate? Yes; Fluid consistency: Thin  Discharged to SNF last month on aspirin 325 and Plavix 75 DAPT for 3 months and then aspirin alone given severe intracranial stenosis. Consider ASA 81 mg and Brilinta 90mg  BID as DAPT for 3 months then aspirin alone if financially feasible. If not then return to the previous discharge plan as above (ASA 325 and Plavix)  Therapy recommendations:  PENDING Disposition:  TBD  Hypertension Stable Permissive hypertension (OK if < 220/120) but gradually normalize in 5-7 days Long-term BP  goal normotensive  Hyperlipidemia Home meds:  Lipitor 10mg ,  LDL 64 at goal < 70 during last month's check while admitted High intensity statin is not indicated as A1C is at goal  Continue statin at discharge  Glycemic Control Home meds:  None, No hx of DM2 HgbA1c 6.1 during last month's admission goal < 7.0  Other Stroke Risk Factors Advanced Age >/= 29  Former Cigarette smoker Previous stroke last month   Other [facility] day # 0  This patient was seen and evaluated with Dr. Leonie Man. He directed the plan of care.  Charlene Brooke, NP-C   STROKE MD NOTE :I have personally obtained history,examined this patient, reviewed notes, independently viewed imaging studies, participated in medical decision making and plan of care.ROS completed by me personally and pertinent positives fully documented  I have made any additions or clarifications directly to the above note. Agree with note above.  Patient presented with falls and MRI scan shows new left cingulate gyrus infarct likely due to severe underlying A2/A3 bilateral anterior cerebral artery stenosis from large vessel intracranial atherosclerosis.  She had similar presentation with bilateral anterior cerebral artery infarction 6 weeks ago now discharged on aspirin and Plavix.  Recommend changed to aspirin and Brilinta if she can afford it for 3 months followed by aspirin alone.  No need to repeat more stroke work-up as it was recently done.  Mobilize out of bed.  Therapy consults.  Maintain aggressive risk factor modification.  Greater than 50% time during this 35-minute visit was spent on counseling and coordination of care and discussion with care team.  Discussed with Dr. Verlon Au.  Follow-up as an outpatient stroke clinic in 2 months.  Antony Contras, MD Medical Director [place] Pager: [phone] 03/29/2021 3:33 PM   To contact Stroke Continuity provider, please refer to [email or web address] After hours,  contact General Neurology

## 2021-03-29 NOTE — Plan of Care (Signed)
  Problem: Education: Goal: Knowledge of disease or condition will improve Outcome: Progressing Goal: Knowledge of secondary prevention will improve Outcome: Progressing Goal: Knowledge of patient specific risk factors addressed and post discharge goals established will improve Outcome: Progressing Goal: Individualized Educational Video(s) Outcome: Progressing   Problem: Coping: Goal: Will verbalize positive feelings about self Outcome: Progressing Goal: Will identify appropriate support needs Outcome: Progressing   Problem: Health Behavior/Discharge Planning: Goal: Ability to manage health-related needs will improve Outcome: Progressing   Problem: Self-Care: Goal: Ability to participate in self-care as condition permits will improve Outcome: Progressing Goal: Ability to communicate needs accurately will improve Outcome: Progressing   Problem: Nutrition: Goal: Dietary intake will improve Outcome: Progressing   Problem: Ischemic Stroke/TIA Tissue Perfusion: Goal: Complications of ischemic stroke/TIA will be minimized Outcome: Progressing   Problem: Education: Goal: Knowledge of General Education information will improve Description: Including pain rating scale, medication(s)/side effects and non-pharmacologic comfort measures Outcome: Progressing   Problem: Health Behavior/Discharge Planning: Goal: Ability to manage health-related needs will improve Outcome: Progressing   Problem: Clinical Measurements: Goal: Ability to maintain clinical measurements within normal limits will improve Outcome: Progressing Goal: Will remain free from infection Outcome: Progressing Goal: Diagnostic test results will improve Outcome: Progressing Goal: Respiratory complications will improve Outcome: Progressing Goal: Cardiovascular complication will be avoided Outcome: Progressing   Problem: Activity: Goal: Risk for activity intolerance will decrease Outcome: Progressing    Problem: Nutrition: Goal: Adequate nutrition will be maintained Outcome: Progressing   Problem: Coping: Goal: Level of anxiety will decrease Outcome: Progressing   Problem: Elimination: Goal: Will not experience complications related to bowel motility Outcome: Progressing Goal: Will not experience complications related to urinary retention Outcome: Progressing   Problem: Pain Managment: Goal: General experience of comfort will improve Outcome: Progressing   Problem: Safety: Goal: Ability to remain free from injury will improve Outcome: Progressing   Problem: Skin Integrity: Goal: Risk for impaired skin integrity will decrease Outcome: Progressing

## 2021-03-29 NOTE — Telephone Encounter (Signed)
Patient's daughter called to report hospitalization for patient. Pt apparently fell yesterday after her office visit at [facility].  And then fell 3 more times at her facility [facility].  Patient was transported to the hospital and stroke work-up was completed yesterday evening. Evidence of new stroke activity was found.  Daughter called to update NP on this along with heart monitor. Daughter was able to find the heart monitor within the patient's belongings, and cardiology has reactivated the device for 30 more days.  Patient will be started on heart monitor once discharged from the hospital.  Also provided information to daughter for care patrol to help identify possible facilities, patient is at assisted living facility now, and may not be able to return given the new stroke and recent falls.  Daughter will call back if she has any other concerns/questions.

## 2021-03-29 NOTE — Consult Note (Signed)
NEUROLOGY CONSULTATION NOTE   Date of service: March 29, 2021 Patient Name: Lindsay Martinez MRN:  [id number] DOB:  Jan 31, 1931 Reason for consult: "Stroke" Requesting Provider: Shela Leff, MD _ _ _   _ __   _ __ _ _  __ __   _ __   __ _  History of Present Illness  Lindsay Martinez is a 87 y.o. female with PMH significant for diabetes, stage III CKD, hypertension, hyperlipidemia, gastric banding (2011), memory impairment who was evaluated by our team last month for confusion, altered mental status, waxing and waning speech difficulty.  CT concerning for left hemispheric infarct.  MRI showed right MCA/PCA punctate, bilateral MCA/ACA and ACA infarcts. Etiology for patient stroke was thought to be intracranial stenosis (b/l A2) in the setting of dehydration and hypotension on presentation. Recommend aspirin 325 and Plavix 75 DAPT for 3 months and then aspirin alone given severe intracranial stenosis. A 30 day cardiac monitor was also recommended.  She is brought in from her facility for multiple reported mechanical falls over the last several days. Today she had a mechanical fall in which it is reported she hit her head.  MRI brain was obtained as part of workup and demonstrated a new left cingulate gyrus stroke and expected evolution of subacute infarcts noted on prior imaging.  Neurology consulted for further workup for the noted strokes.  She has mild aphasia and takes several pauses and gets stuck on words which limits the history. Reports falling at her facility due to strokes and her legs being weak. Feels like her left leg is worse than her right.   ROS   Constitutional Denies weight loss, fever and chills.   HEENT Denies changes in vision and hearing.   Respiratory Denies SOB and cough.   CV Denies palpitations and CP   GI Denies abdominal pain, nausea, vomiting and diarrhea.   GU Denies dysuria and urinary frequency.   MSK Denies myalgia and joint pain.   Skin Denies rash and  pruritus.   Neurological Denies headache and syncope.   Psychiatric Denies recent changes in mood. Denies anxiety and depression.    Past History  No past medical history on file.  No family history on file. Social History   Socioeconomic History   Marital status: Single    Spouse name: Not on file   Number of children: Not on file   Years of education: Not on file   Highest education level: Not on file  Occupational History   Not on file  Tobacco Use   Smoking status: Not on file   Smokeless tobacco: Not on file  Substance and Sexual Activity   Alcohol use: Not on file   Drug use: Not on file   Sexual activity: Not on file  Other Topics Concern   Not on file  Social History Narrative   Not on file   Social Determinants of Health   Financial Resource Strain: Not on file  Food Insecurity: Not on file  Transportation Needs: Not on file  Physical Activity: Not on file  Stress: Not on file  Social Connections: Not on file   No Known Allergies  Medications  (Not in a hospital admission)    Vitals   Vitals:   03/29/21 0100 03/29/21 0127 03/29/21 0130 03/29/21 0313  BP: (!) 150/81  (!) 153/54 (!) 146/99  Pulse: 80  (!) 104 78  Resp: (!) 27  14 17   Temp:    97.6 F (36.4 C)  TempSrc:    Oral  SpO2: 100%  100% 97%  Weight:  78 kg    Height:  4\' 9"  (1.448 m)       Body mass index is 37.22 kg/m.  Physical Exam   General: Laying comfortably in bed; in no acute distress.  HENT: Normal oropharynx and mucosa. Normal external appearance of ears and nose.  Neck: Supple, no pain or tenderness  CV: No JVD. No peripheral edema.  Pulmonary: Symmetric Chest rise. Normal respiratory effort.  Abdomen: Soft to touch, non-tender.  Ext: No cyanosis, edema, or deformity  Skin: No rash. Normal palpation of skin.   Musculoskeletal: Normal digits and nails by inspection. No clubbing.   Neurologic Examination  Mental status/Cognition: Alert, oriented to self, place, year  but not to month, good attention.  Speech/language: Fluent, comprehension intact, object naming intact, repetition intact.  Cranial nerves:   CN II Pupils equal and reactive to light, no VF deficits    CN III,IV,VI EOM intact, no gaze preference or deviation, no nystagmus    CN V normal sensation in V1, V2, and V3 segments bilaterally    CN VII no asymmetry, no nasolabial fold flattening    CN VIII normal hearing to speech    CN IX & X normal palatal elevation, no uvular deviation    CN XI 5/5 head turn and 5/5 shoulder shrug bilaterally    CN XII midline tongue protrusion    Motor:  Muscle bulk: poor, tone normal, pronator drift none tremor none Mvmt Root Nerve  Muscle Right Left Comments  SA C5/6 Ax Deltoid 5 5   EF C5/6 Mc Biceps 5 5   EE C6/7/8 Rad Triceps 5 5   WF C6/7 Med FCR     WE C7/8 PIN ECU     F Ab C8/T1 U ADM/FDI 5 4+   HF L1/2/3 Fem Illopsoas 5 4+   KE L2/3/4 Fem Quad 5 5   DF L4/5 D Peron Tib Ant 5 5   PF S1/2 Tibial Grc/Sol 4+ 5    Reflexes:  Right Left Comments  Pectoralis      Biceps (C5/6) 1 1   Brachioradialis (C5/6) 1 1    Triceps (C6/7) 1 1    Patellar (L3/4) 0 0    Achilles (S1)      Hoffman      Plantar mute mute   Jaw jerk    Sensation:  Light touch Intact throughout   Pin prick    Temperature    Vibration   Proprioception    Coordination/Complex Motor:  - Finger to Nose intact BL - Heel to shin dificult to do with no obvious ataxia. - Rapid alternating movement are slowed in LUE. - Gait: unsafe to assess given multiple falls at her facility for which she is brought in.  Labs   CBC:  Recent Labs  Lab 03/28/21 2351  WBC 8.2  NEUTROABS 5.0  HGB 7.9*  HCT 26.8*  MCV 86.5  PLT 884    Basic Metabolic Panel:  Lab Results  Component Value Date   NA 134 (L) 03/28/2021   K 4.1 03/28/2021   CO2 21 (L) 03/28/2021   GLUCOSE 108 (H) 03/28/2021   BUN 23 03/28/2021   CREATININE 1.40 (H) 03/28/2021   CALCIUM 9.9 03/28/2021   GFRNONAA  36 (L) 03/28/2021   Lipid Panel: No results found for: LDLCALC HgbA1c: No results found for: HGBA1C Urine Drug Screen: No results found for: LABOPIA, COCAINSCRNUR, [place], AMPHETMU,  THCU, LABBARB  Alcohol Level No results found for: [place]  CT Head without contrast: Personally reviewed and CTH was negative for a large hypodensity concerning for a large territory infarct or yperdensity concerning for an ICH. Evolving strokes noted on prior imaging.  MR Angio head without contrast and Carotid Duplex BL: Pending.  MRI Brain: Personally reviewed and in addition to evolving old stroke, has a new left cingulate stroke. Impression   Emmajane Altamura is a 87 y.o. female with PMH significant for significant for diabetes, stage III CKD, hypertension, hyperlipidemia, gastric banding (2011), memory impairment who presents with multiple falls from her facility and found to have a new left cingulate gyrus stroke in addition to the noted evolution of prior strokes from last month. Her neurologic examination is notable for mild aphasia and she gets stuck mid sentence and mild LLE hip flexion weakness and decreased rapid alternating movements in LUE.  I suspect that that cingulate gyrus stroke is probably a small vessel stroke. Would recommend continuing Asa and plavix for 3 months, followed by plavix alone.  Primary Diagnosis:  Other cerebral infarction due to occlusion of stenosis of small artery.  Secondary Diagnosis: Essential (primary) hypertension, Type 2 diabetes mellitus w/o complications, and CKD Stage 3 (GFR 30-59)  Recommendations  Plan:  - Frequent Neuro checks per stroke unit protocol - Recommend Vascular imaging with MRA Angio Head without contrast and US Carotid doppler - No need to repeat TTE given this was recently completed. - Recommend obtaining Lipid panel with LDL - Please start statin if LDL > 70 - Recommend HbA1c - Antithrombotic - Asa 81mg  daily along with Plavix 75mg  daily x 90  days, then Asa 81mg  daily alone. - Recommend DVT ppx - SBP goal - permissive hypertension first 24 h < 220/110. Held home meds.  - Recommend Telemetry monitoring for arrythmia - Recommend bedside swallow screen prior to PO intake. - Stroke education booklet - Recommend PT/OT/SLP consult   ______________________________________________________________________   Thank you for the opportunity to take part in the care of this patient. If you have any further questions, please contact the neurology consultation attending.  Signed,  [place] Pager Number [phone] _ _ _   _ __   _ __ _ _  __ __   _ __   __ _

## 2021-03-30 ENCOUNTER — Telehealth: Payer: Self-pay | Admitting: *Deleted

## 2021-03-30 ENCOUNTER — Encounter: Payer: Self-pay | Admitting: *Deleted

## 2021-03-30 ENCOUNTER — Encounter (HOSPITAL_COMMUNITY): Payer: Self-pay | Admitting: Internal Medicine

## 2021-03-30 DIAGNOSIS — Z743 Need for continuous supervision: Secondary | ICD-10-CM | POA: Diagnosis not present

## 2021-03-30 DIAGNOSIS — I10 Essential (primary) hypertension: Secondary | ICD-10-CM | POA: Diagnosis not present

## 2021-03-30 DIAGNOSIS — I6789 Other cerebrovascular disease: Secondary | ICD-10-CM | POA: Diagnosis not present

## 2021-03-30 DIAGNOSIS — K219 Gastro-esophageal reflux disease without esophagitis: Secondary | ICD-10-CM | POA: Diagnosis not present

## 2021-03-30 DIAGNOSIS — N1831 Chronic kidney disease, stage 3a: Secondary | ICD-10-CM | POA: Diagnosis not present

## 2021-03-30 DIAGNOSIS — E1122 Type 2 diabetes mellitus with diabetic chronic kidney disease: Secondary | ICD-10-CM | POA: Diagnosis not present

## 2021-03-30 DIAGNOSIS — R2689 Other abnormalities of gait and mobility: Secondary | ICD-10-CM | POA: Diagnosis not present

## 2021-03-30 DIAGNOSIS — I4891 Unspecified atrial fibrillation: Secondary | ICD-10-CM | POA: Diagnosis not present

## 2021-03-30 DIAGNOSIS — Z741 Need for assistance with personal care: Secondary | ICD-10-CM | POA: Diagnosis not present

## 2021-03-30 DIAGNOSIS — E119 Type 2 diabetes mellitus without complications: Secondary | ICD-10-CM | POA: Diagnosis not present

## 2021-03-30 DIAGNOSIS — I639 Cerebral infarction, unspecified: Secondary | ICD-10-CM | POA: Diagnosis not present

## 2021-03-30 DIAGNOSIS — R296 Repeated falls: Secondary | ICD-10-CM | POA: Diagnosis not present

## 2021-03-30 DIAGNOSIS — F32A Depression, unspecified: Secondary | ICD-10-CM | POA: Diagnosis not present

## 2021-03-30 DIAGNOSIS — R531 Weakness: Secondary | ICD-10-CM | POA: Diagnosis not present

## 2021-03-30 DIAGNOSIS — G459 Transient cerebral ischemic attack, unspecified: Secondary | ICD-10-CM | POA: Diagnosis not present

## 2021-03-30 DIAGNOSIS — U071 COVID-19: Secondary | ICD-10-CM | POA: Diagnosis not present

## 2021-03-30 DIAGNOSIS — R269 Unspecified abnormalities of gait and mobility: Secondary | ICD-10-CM | POA: Diagnosis not present

## 2021-03-30 DIAGNOSIS — G479 Sleep disorder, unspecified: Secondary | ICD-10-CM | POA: Diagnosis not present

## 2021-03-30 DIAGNOSIS — M6259 Muscle wasting and atrophy, not elsewhere classified, multiple sites: Secondary | ICD-10-CM | POA: Diagnosis not present

## 2021-03-30 DIAGNOSIS — R279 Unspecified lack of coordination: Secondary | ICD-10-CM | POA: Diagnosis not present

## 2021-03-30 DIAGNOSIS — R262 Difficulty in walking, not elsewhere classified: Secondary | ICD-10-CM | POA: Diagnosis not present

## 2021-03-30 DIAGNOSIS — Z7401 Bed confinement status: Secondary | ICD-10-CM | POA: Diagnosis not present

## 2021-03-30 DIAGNOSIS — E785 Hyperlipidemia, unspecified: Secondary | ICD-10-CM | POA: Diagnosis not present

## 2021-03-30 DIAGNOSIS — M6281 Muscle weakness (generalized): Secondary | ICD-10-CM | POA: Diagnosis not present

## 2021-03-30 DIAGNOSIS — R2681 Unsteadiness on feet: Secondary | ICD-10-CM | POA: Diagnosis not present

## 2021-03-30 DIAGNOSIS — N179 Acute kidney failure, unspecified: Secondary | ICD-10-CM | POA: Diagnosis not present

## 2021-03-30 DIAGNOSIS — R4701 Aphasia: Secondary | ICD-10-CM | POA: Diagnosis not present

## 2021-03-30 MED ORDER — TICAGRELOR 90 MG PO TABS: 90.0000 mg | ORAL_TABLET | Freq: Two times a day (BID) | ORAL | 3 refills | Status: AC

## 2021-03-30 MED ORDER — ASPIRIN 81 MG PO TBEC: 81.0000 mg | DELAYED_RELEASE_TABLET | Freq: Every day | ORAL | 11 refills | Status: AC

## 2021-03-30 MED ORDER — TICAGRELOR 90 MG PO TABS
90.0000 mg | ORAL_TABLET | Freq: Two times a day (BID) | ORAL | Status: DC
  Administered 2021-03-30: 90 mg via ORAL
  Filled 2021-03-30: qty 1

## 2021-03-30 MED ORDER — LOPERAMIDE HCL 2 MG PO TABS: 2.0000 mg | ORAL_TABLET | Freq: Four times a day (QID) | ORAL | 0 refills | Status: AC | PRN

## 2021-03-30 MED ORDER — ASPIRIN EC 81 MG PO TBEC
81.0000 mg | DELAYED_RELEASE_TABLET | Freq: Every day | ORAL | Status: DC
  Administered 2021-03-30: 81 mg via ORAL
  Filled 2021-03-30: qty 1

## 2021-03-30 NOTE — Telephone Encounter (Signed)
Patient will be transferred back to [facility] 03/30/21. They need a written order faxed to their facility for the 30 day Cardiac event monitor to be applied and managed for 30 days.  The monitor is in their possession and enrollment has been updated.  Please have Dr.Crenshaw/ Fredia Beets, fax written order to apply and manage 30 day cardiac event monitor to [facility] at fax # [phone], attention Dorothyann Peng. DX I63.9 Cryptogenic stroke, rule out a-fib.  Please note patient has 2 charts at this time.  The order for the cardiac event monitor is in chart MRN [id number].  Patient is being discharged to [facility] today from chart MRN [id number].

## 2021-03-30 NOTE — TOC Initial Note (Signed)
Transition of Care [facility] - Initial/Assessment Note    Patient Details  Name: Lindsay Martinez MRN: [id number] Date of Birth: 11-18-1930  [facility] CM/SW Contact:    Pollie Friar, RN Phone Number: 03/30/2021, 11:13 AM  Clinical Narrative:                 Patient is from [place] and has had multiple falls. Per daughter, Arlina Robes is saying the patient can not return to the ILF and they have no space in their ALF. Cm has left a voicemail for ALF at [facility].  Daughter is interested in SNF rehab prior to the next step which she feels will be ALF for her mother. Pt was recently at [facility] and daughter is interested in her returning. CM has messaged [facility]. Her second choice would be [facility].  CM will also send FL2 to [facility] for after SNF rehab stay.  TOC following.  Expected Discharge Plan: Skilled Nursing Facility Barriers to Discharge: Continued Medical Work up   Patient Goals and CMS Choice   CMS Medicare.gov Compare Post Acute Care list provided to:: Patient Represenative (must comment) Choice offered to / list presented to : Adult Children (daughter)  Expected Discharge Plan and Services Expected Discharge Plan: Skilled Nursing Facility In-house Referral: Clinical Social Work Discharge Planning Services: CM Consult Post Acute Care Choice: [place] arrangements for the past 2 months: [place], [place] Expected Discharge Date: 03/30/21                                    Prior Living Arrangements/Services Living arrangements for the past 2 months: [place], [place] Lives with:: Self Patient language and need for interpreter reviewed:: Yes Do you feel safe going back to the place where you live?: Yes      Need for Family Participation in Patient Care: Yes (Comment) Care giver support system in place?: No (comment) Current home services: DME  (walker/ rollator) Criminal Activity/Legal Involvement Pertinent to Current Situation/Hospitalization: No - Comment as needed  Activities of Daily Living      Permission Sought/Granted                  Emotional Assessment Appearance:: Appears stated age Attitude/Demeanor/Rapport: Engaged Affect (typically observed): Accepting Orientation: : Oriented to Self, Oriented to Place, Oriented to  Time, Oriented to Situation   Psych Involvement: No (comment)  Admission diagnosis:  Fall at nursing home [phone].Merril Abbe, Y92.129] Acute CVA (cerebrovascular accident) [facility]) [I63.9] Patient Active Problem List   Diagnosis Date Noted   Acute CVA (cerebrovascular accident) ([place]) 03/29/2021   Diarrhea 03/29/2021   Anemia 03/29/2021   Essential hypertension 03/29/2021   Hyperlipidemia 03/29/2021   PCP:  Kristen Loader, FNP Pharmacy:   [facility], [address] [place]. [place] [address] Phone: [phone] Fax: [phone]     Social Determinants of Health (SDOH) Interventions    Readmission Risk Interventions No flowsheet data found.

## 2021-03-30 NOTE — Progress Notes (Signed)
Pt discharged to facility with PTAR. Pt A/o x4 upon discharge. No distress noted. Pt assisted to stretcher with roller walker.

## 2021-03-30 NOTE — NC FL2 (Signed)
[place] LEVEL OF CARE SCREENING TOOL     IDENTIFICATION  Patient Name: Lindsay Martinez Birthdate: 1930/11/30 Sex: female Admission Date (Current Location): 03/28/2021  [facility] and [place] Number:  Herbalist and Address:  [facility]  [facility]. [facility], [place] [address] [place], [place] [address]      Provider Number: [id number]  Attending Physician Name and Address:  Nita Sells, MD  Relative Name and Phone Number:       Current Level of Care: Hospital Recommended Level of Care: [place] Prior Approval Number:    Date Approved/Denied:   PASRR Number: [id number]  Discharge Plan: Other (Comment) (ALF)    Current Diagnoses: Patient Active Problem List   Diagnosis Date Noted   Acute CVA (cerebrovascular accident) ([place]) 03/29/2021   Diarrhea 03/29/2021   Anemia 03/29/2021   Essential hypertension 03/29/2021   Hyperlipidemia 03/29/2021    Orientation RESPIRATION BLADDER Height & Weight     Self, Time, Situation, Place  Normal Incontinent Weight: 78 kg Height:  4\' 9"  (144.8 cm)  BEHAVIORAL SYMPTOMS/MOOD NEUROLOGICAL BOWEL NUTRITION STATUS      Continent Diet (heart healthy/ carb modified with thin liquids)  AMBULATORY STATUS COMMUNICATION OF NEEDS Skin   Limited Assist Verbally Skin abrasions, Bruising (skin abrasion to rt hand/ ecchymosis to bilateral arms and legs)                       Personal Care Assistance Level of Assistance  Bathing, Feeding, Dressing Bathing Assistance: Limited assistance Feeding assistance: Independent Dressing Assistance: Limited assistance     Functional Limitations Info  Sight, Hearing, Speech Sight Info: Adequate Hearing Info: Adequate Speech Info: Impaired (slur)    SPECIAL CARE FACTORS FREQUENCY  PT (By licensed PT), OT (By licensed OT), Speech therapy     PT Frequency: 5x/wk OT Frequency: 5x/wk     Speech Therapy Frequency: 5x/wk      Contractures  Contractures Info: Not present    Additional Factors Info  Code Status, Allergies, Psychotropic Code Status Info: Full Allergies Info: NKA Psychotropic Info: Paxil 20 mg daily/ trazadone 50 mg at bedtime         Current Medications (03/30/2021):  This is the current hospital active medication list Current Facility-Administered Medications  Medication Dose Route Frequency Provider Last Rate Last Admin   acetaminophen (TYLENOL) tablet 650 mg  650 mg Oral Q4H PRN Shela Leff, MD       Or   acetaminophen (TYLENOL) 160 MG/5ML solution 650 mg  650 mg Per Tube Q4H PRN Shela Leff, MD       Or   acetaminophen (TYLENOL) suppository 650 mg  650 mg Rectal Q4H PRN Shela Leff, MD       aspirin EC tablet 81 mg  81 mg Oral Daily Leonie Man, Pramod S, MD       atorvastatin (LIPITOR) tablet 10 mg  10 mg Oral Daily Shela Leff, MD   10 mg at 03/29/21 1203   oxybutynin (DITROPAN) tablet 5 mg  5 mg Oral BID Shela Leff, MD   5 mg at 03/29/21 2200   pantoprazole (PROTONIX) EC tablet 40 mg  40 mg Oral Daily Shela Leff, MD   40 mg at 03/29/21 1203   PARoxetine (PAXIL) tablet 20 mg  20 mg Oral Daily Shela Leff, MD   20 mg at 03/29/21 1204   ticagrelor (BRILINTA) tablet 90 mg  90 mg Oral BID Garvin Fila, MD  traZODone (DESYREL) tablet 50 mg  50 mg Oral QHS Shela Leff, MD   50 mg at 03/29/21 2200     Discharge Medications: Please see discharge summary for a list of discharge medications.  Relevant Imaging Results:  Relevant Lab Results:   Additional Information SS#: [id number]  Pollie Friar, RN

## 2021-03-30 NOTE — TOC Transition Note (Signed)
Transition of Care [facility]) - CM/SW Discharge Note   Patient Details  Name: Lindsay Martinez MRN: [id number] Date of Birth: 1931/06/16  [facility] CM/SW Contact:  Pollie Friar, RN Phone Number: 03/30/2021, 1:22 PM   Clinical Narrative:    Patient is discharging to [facility] today. PTAR to provide transport. D/c packet is at the desk and bedside RN updated.   Room: 3225 Number for report: [phone]   Final next level of care: Skilled Nursing Facility Barriers to Discharge: No Barriers Identified   Patient Goals and CMS Choice   CMS Medicare.gov Compare Post Acute Care list provided to:: Patient Represenative (must comment) Choice offered to / list presented to : Adult Children  Discharge Placement PASRR number recieved: 03/30/21            Patient [facility] bed at: [facility] Patient to be transferred to facility by: [place] Name of family member notified: [name] Patient and family notified of of transfer: 03/30/21  Discharge Plan and Services In-house Referral: Clinical Social Work Discharge Planning Services: CM Consult Post Acute Care Choice: [place]                               Social Determinants of Health (SDOH) Interventions     Readmission Risk Interventions No flowsheet data found.

## 2021-03-30 NOTE — Plan of Care (Signed)

## 2021-03-30 NOTE — Evaluation (Addendum)
Speech Language Pathology Evaluation Patient Details Name: Lindsay Martinez MRN: [id number] DOB: 01/11/1931 Today's Date: 03/30/2021 Time: 2751-7001 SLP Time Calculation (min) (ACUTE ONLY): 24 min  Problem List:  Patient Active Problem List   Diagnosis Date Noted   Acute CVA (cerebrovascular accident) ([place]) 03/29/2021   Diarrhea 03/29/2021   Anemia 03/29/2021   Essential hypertension 03/29/2021   Hyperlipidemia 03/29/2021   Past Medical History: History reviewed. No pertinent past medical history. Past Surgical History: History reviewed. No pertinent surgical history. HPI:  Pt is a 87 y.o. female who presents to the ED from nursing home for evaluation of multiple mechanical falls over the last few days. MRI brain (9/29) showing acute ischemia in the left cingulate gyrus.  Expected evolution of subacute bilateral corona radiata infarcts. PMH:  CVA in August 2022 with residual cognitive impairment, gait impairment, and mild RUE weakness, hypertension, hyperlipidemia, type 2 diabetes, GERD.   Assessment / Plan / Recommendation Clinical Impression  Pt presents with cognitive communication deficits with uncertainty of any change from baseline. She is oriented fully to self and situation, though struggled to provide current month, day of week and even state without verbal choice cues or time for processing. Portions of [employer] Mental Status Examination (SLUMS) administered with pt exhibiting reduced immediate (4/5) and delayed (0/5) recall of listed items. Delayed recall improved with semantic and choice cues to 4/5. Executive functions and divergent naming also poor. Pt has no difficulty with engaging in simple conversation, though some hesistancies and word finding difficulties noted, though question cognitive component vs true language impairment. Auditory comprehension appeared [facility]. Speech was without dysarthria. SLP to f/u during acute stay to continue diagnostic treatment for  cognitive/language function.    SLP Assessment  SLP Recommendation/Assessment: Patient needs continued Speech [place] [facility] Services SLP Visit Diagnosis: Cognitive communication deficit (R41.841)    Recommendations for follow up therapy are one component of a multi-disciplinary discharge planning process, led by the attending physician.  Recommendations may be updated based on patient status, additional functional criteria and insurance authorization.    Follow Up Recommendations  Skilled Nursing facility    Frequency and Duration           SLP Evaluation Cognition  Overall Cognitive Status: History of cognitive impairments - at baseline Arousal/Alertness: Awake/alert Orientation Level: Oriented to person;Oriented to place;Oriented to situation;Disoriented to time Year: 2022 Month: September Day of Week: Incorrect Attention: Sustained Sustained Attention: Appears intact Memory: Impaired Memory Impairment: Storage deficit;Retrieval deficit;Decreased recall of new information Immediate Memory Recall:  (4/5 SLUMS) Awareness: Appears intact Problem Solving: Appears intact Executive Function: Organizing;Reasoning Reasoning: Impaired Reasoning Impairment: Functional basic Organizing: Impaired Organizing Impairment: Functional basic Safety/Judgment: Appears intact       Comprehension  Auditory Comprehension Overall Auditory Comprehension: Appears within functional limits for tasks assessed Visual Recognition/Discrimination Discrimination: Not tested Reading Comprehension Reading Status: Not tested    Expression Expression Primary Mode of Expression: Verbal Verbal Expression Overall Verbal Expression: Impaired Initiation: No impairment Automatic Speech: Name;Social Response;Counting;Month of year Level of Generative/Spontaneous Verbalization: Conversation Repetition: No impairment Naming: Impairment Confrontation: Impaired Divergent: 0-24% accurate Interfering  Components: Premorbid deficit Written Expression Dominant Hand: Right Written Expression: Not tested   Oral / Motor  Oral Motor/Sensory Function Overall Oral Motor/Sensory Function: Within functional limits Motor Speech Overall Motor Speech: Appears within functional limits for tasks assessed Interfering Components: Inadequate dentition   GO                    Bubba Hales  Artis Flock [place], [place] Office Number: [phone]  Acie Fredrickson 03/30/2021, 11:58 AM

## 2021-03-30 NOTE — Evaluation (Signed)
Occupational Therapy Evaluation Patient Details Name: Lindsay Martinez MRN: [id number] DOB: [birth year] Today's Date: 03/30/2021   History of Present Illness Lindsay Martinez is a 87 y.o. 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  History of hypertension, hyperlipidemia, type 2 diabetes, GERD.  Patient presents to the ED from her nursing home for evaluation of multiple mechanical falls over the last few days.  Yesterday she had a fall again and hit her head. Brain MRI showing acute ischemia in the left cingulate gyrus.  Expected evolution of subacute bilateral corona radiata infarcts.   Clinical Impression   Pt admitted for concerns listed above. PTA pt was living in an ILF, completeing all ADL's independently and having assistance with meals, medication management, cleaning, and community mobility. Pt reports using a rollator at baseline for all functional mobility. At this time, pt presents with difficulties problem solving and attending to tasks without verbal cues. Additionally, pt is experiencing some mild balance deficits. She is requiring supervision to min A for all ADL's and functional mobility in order to maintain safety and sucessfully complete tasks. Recommend SNF level therapies once medically stable to maximize pt's return to independence. OT will follow acutely.      Recommendations for follow up therapy are one component of a multi-disciplinary discharge planning process, led by the attending physician.  Recommendations may be updated based on patient status, additional functional criteria and insurance authorization.   Follow Up Recommendations  SNF;Supervision/Assistance - 24 hour    Equipment Recommendations  None recommended by OT    Recommendations for Other Services       Precautions / Restrictions Precautions Precautions: Fall Restrictions Weight Bearing Restrictions: No       Mobility Bed Mobility Overal bed mobility: Needs Assistance Bed Mobility: Supine to Sit     Supine to sit: Min guard     General bed mobility comments: Min guard and use of rails to come up slowly    Transfers Overall transfer level: Needs assistance Equipment used: Rolling walker (2 wheeled) Transfers: Sit to/from Stand Sit to Stand: Min guard              Balance Overall balance assessment: Needs assistance Sitting-balance support: Feet supported Sitting balance-Leahy Scale: Good     Standing balance support: Bilateral upper extremity supported;During functional activity Standing balance-Leahy Scale: Fair Standing balance comment: reliant on B UE support                           ADL either performed or assessed with clinical judgement   ADL Overall ADL's : Needs assistance/impaired Eating/Feeding: Set up;Sitting   Grooming: Supervision/safety;Standing   Upper Body Bathing: Supervision/ safety;Sitting   Lower Body Bathing: Minimal assistance;Sitting/lateral leans;Sit to/from stand   Upper Body Dressing : Supervision/safety;Sitting   Lower Body Dressing: Minimal assistance;Sitting/lateral leans;Sit to/from stand   Toilet Transfer: Min guard;Ambulation   Toileting- Clothing Manipulation and Hygiene: Min guard;Sitting/lateral lean;Sit to/from stand       Functional mobility during ADLs: Min guard;Rolling walker General ADL Comments: Pt overall requiring supervision to min A for all ADL's and functional mobility due to decreased balance, difficulty problem solving, and inattention.     Vision Baseline Vision/History: 1 Wears glasses Ability to See in Adequate Light: 0 Adequate Patient Visual Report: No change from baseline Vision Assessment?: No apparent visual deficits     Perception Perception Perception Tested?: No  Praxis Praxis Praxis tested?: Within functional limits    Pertinent Vitals/Pain Pain Assessment: Faces Faces Pain  Scale: No hurt     Hand Dominance Right   Extremity/Trunk Assessment Upper Extremity Assessment Upper Extremity Assessment: Overall WFL for tasks assessed   Lower Extremity Assessment Lower Extremity Assessment: Defer to PT evaluation   Cervical / Trunk Assessment Cervical / Trunk Assessment: Normal   Communication Communication Communication: Expressive difficulties   Cognition Arousal/Alertness: Awake/alert Behavior During Therapy: WFL for tasks assessed/performed Overall Cognitive Status: History of cognitive impairments - at baseline                                 General Comments: Pt has hx of mild dementia   General Comments  VSS on RA    Exercises     Shoulder Instructions      Home Living Family/patient expects to be discharged to:: Skilled nursing facility                                 Additional Comments: Pt from ILF, requiring further supervision and therapy progression prior to returning to ILF.      Prior Functioning/Environment Level of Independence: Independent with assistive device(s)        Comments: gets assistance with meals and medications.        OT Problem List: Decreased strength;Decreased range of motion;Decreased activity tolerance;Impaired balance (sitting and/or standing);Decreased safety awareness;Decreased knowledge of use of DME or AE      OT Treatment/Interventions: Self-care/ADL training;Therapeutic exercise;Energy conservation;DME and/or AE instruction;Therapeutic activities;Patient/family education;Balance training;Cognitive remediation/compensation    OT Goals(Current goals can be found in the care plan section) Acute Rehab OT Goals Patient Stated Goal: to return home OT Goal Formulation: With patient Time For Goal Achievement: 04/13/21 Potential to Achieve Goals: Good ADL Goals Pt Will Perform Grooming: with modified independence;standing Pt Will Perform Lower Body Bathing: with modified  independence;sitting/lateral leans;sit to/from stand Pt Will Perform Lower Body Dressing: with modified independence;sitting/lateral leans;sit to/from stand Pt Will Transfer to Toilet: with modified independence;ambulating Pt Will Perform Toileting - Clothing Manipulation and hygiene: with modified independence;sitting/lateral leans;sit to/from stand  OT Frequency: Min 2X/week   Barriers to D/C:            Co-evaluation              AM-PAC OT "6 Clicks" Daily Activity     Outcome Measure Help from another person eating meals?: A Little Help from another person taking care of personal grooming?: A Little Help from another person toileting, which includes using toliet, bedpan, or urinal?: A Little Help from another person bathing (including washing, rinsing, drying)?: A Little Help from another person to put on and taking off regular upper body clothing?: A Little Help from another person to put on and taking off regular lower body clothing?: A Little 6 Click Score: 18   End of Session Equipment Utilized During Treatment: Gait belt;Rolling walker Nurse Communication: Mobility status  Activity Tolerance: Patient tolerated treatment well Patient left: in chair;with call bell/phone within reach;with chair alarm set  OT Visit Diagnosis: Unsteadiness on feet (R26.81);Other abnormalities of gait and mobility (R26.89);Muscle weakness (generalized) (M62.81)                Time: 8242-3536 OT Time Calculation (min): 19 min Charges:  OT General Charges $OT Visit: 1 Visit OT  Evaluation $OT Eval Moderate Complexity: 1 Mod  Odis Wickey H., OTR/L Acute Rehabilitation  [place] Boody Elane Yolanda Bonine 03/30/2021, 11:48 AM

## 2021-03-30 NOTE — Telephone Encounter (Signed)
Done

## 2021-03-30 NOTE — Evaluation (Addendum)
Physical Therapy Evaluation Patient Details Name: Lindsay Martinez MRN: [id number] DOB: [birth year] Today's Date: 03/30/2021  History of Present Illness  Lindsay Martinez is a 87 y.o. 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  History of hypertension, hyperlipidemia, type 2 diabetes, GERD.  Patient presents to the ED from her nursing home for evaluation of multiple mechanical falls over the last few days.  Yesterday she had a fall again and hit her head. Brain MRI showing acute ischemia in the left cingulate gyrus.  Expected evolution of subacute bilateral corona radiata infarcts.   Clinical Impression  Patient received up walking with OT present in room. She is ambulating with RW and min guard. Slow, steady pace with occasional cues for safety. Patient reports no pain or change in strength or sensation. She will benefit from continued skilled PT to improve balance due to recent falls and improve endurance.         Recommendations for follow up therapy are one component of a multi-disciplinary discharge planning process, led by the attending physician.  Recommendations may be updated based on patient status, additional functional criteria and insurance authorization.  Follow Up Recommendations SNF    Equipment Recommendations  None recommended by PT    Recommendations for Other Services       Precautions / Restrictions Precautions Precautions: Fall Restrictions Weight Bearing Restrictions: No      Mobility  Bed Mobility Overal bed mobility: Needs Assistance Bed Mobility: Supine to Sit     Supine to sit: Min guard          Transfers Overall transfer level: Needs assistance Equipment used: Rolling walker (2 wheeled) Transfers: Sit to/from Stand Sit to Stand: Min guard            Ambulation/Gait Ambulation/Gait assistance: Min guard Gait Distance (Feet): 15 Feet Assistive  device: Rolling walker (2 wheeled) Gait Pattern/deviations: Step-through pattern;Decreased step length - right;Decreased step length - left;Trunk flexed;Decreased stride length;Shuffle Gait velocity: decr   General Gait Details: patient reports that she feels to be moving at her baseline. Requires cues for safety ( to keep both hands on walker)  Stairs            Wheelchair Mobility    Modified Rankin (Stroke Patients Only) Modified Rankin (Stroke Patients Only) Pre-Morbid Rankin Score: Moderate disability Modified Rankin: Moderate disability     Balance Overall balance assessment: Needs assistance Sitting-balance support: Feet supported Sitting balance-Leahy Scale: Good     Standing balance support: Bilateral upper extremity supported;During functional activity Standing balance-Leahy Scale: Fair Standing balance comment: reliant on B UE support                             Pertinent Vitals/Pain Pain Assessment: No/denies pain    Home Living Family/patient expects to be discharged to:: Skilled nursing facility                      Prior Function Level of Independence: Independent with assistive device(s)         Comments: gets assistance with meals and medications.     Hand Dominance        Extremity/Trunk Assessment   Upper Extremity Assessment Upper Extremity Assessment: Defer to OT evaluation    Lower Extremity Assessment Lower Extremity Assessment: Overall WFL for tasks assessed    Cervical / Trunk Assessment Cervical / Trunk  Assessment: Normal  Communication   Communication: Expressive difficulties  Cognition Arousal/Alertness: Awake/alert Behavior During Therapy: WFL for tasks assessed/performed Overall Cognitive Status: History of cognitive impairments - at baseline                                        General Comments      Exercises     Assessment/Plan    PT Assessment Patent does not need  any further PT services  PT Problem List Decreased mobility;Decreased safety awareness;Decreased activity tolerance;Decreased cognition;Obesity       PT Treatment Interventions Gait training;Functional mobility training;Patient/family education    PT Goals (Current goals can be found in the Care Plan section)  Acute Rehab PT Goals Patient Stated Goal: to return home PT Goal Formulation: With patient Time For Goal Achievement: 04/06/21 Potential to Achieve Goals: Good    Frequency     Barriers to discharge        Co-evaluation               AM-PAC PT "6 Clicks" Mobility  Outcome Measure Help needed turning from your back to your side while in a flat bed without using bedrails?: A Little Help needed moving from lying on your back to sitting on the side of a flat bed without using bedrails?: A Little Help needed moving to and from a bed to a chair (including a wheelchair)?: A Little Help needed standing up from a chair using your arms (e.g., wheelchair or bedside chair)?: A Little Help needed to walk in hospital room?: A Little Help needed climbing 3-5 steps with a railing? : A Lot 6 Click Score: 17    End of Session   Activity Tolerance: Patient tolerated treatment well Patient left: in chair;with chair alarm set Nurse Communication: Mobility status PT Visit Diagnosis: Muscle weakness (generalized) (M62.81);Difficulty in walking, not elsewhere classified (R26.2)    Time: 0912-0922 PT Time Calculation (min) (ACUTE ONLY): 10 min   Charges:   PT Evaluation $PT Eval Low Complexity: 1 Low          Gaby Harney, PT, GCS 03/30/21,9:37 AM

## 2021-03-30 NOTE — Discharge Summary (Addendum)
Physician Discharge Summary  Lindsay Martinez TLX:[id number] DOB: 1930-10-04 DOA: 03/28/2021  PCP: Kristen Loader, FNP  Admit date: 03/28/2021 Discharge date: 03/30/2021  Time spent: 27 minutes  Recommendations for Outpatient Follow-up:  Changed from aspirin Plavix to aspirin and Brilinta X 3 months and then aspirin alone as per neurology Return to skilled facility for further management of symptoms and symptomatology Recommend CBC Chem-12 in about 1 week Consider initiation of metformin in the outpatient setting if patient is able to tolerate  Discharge Diagnoses:  MAIN problem for hospitalization   Recurrent stroke  Please see below for itemized issues addressed in [place]- refer to other progress notes for clarity if needed  Discharge Condition: Fair  Diet recommendation: Heart healthy  Filed Weights   03/29/21 0127  Weight: 78 kg    History of present illness:  87 year old [facility] IL[facility] resident female, CKD 3a DM TY 2 HTN reflux obesity BMI 37 gastric banding 2011, mild dementia Recent hospitalization for presumed cardioembolic CVA-DC aspirin Plavix supposed to have 30-day monitor Several mechanical falls facility prompted ED work-up Hemoglobin found to be 7 baseline 9-11 Creatinine 1.4 CT head = small right temporal scalp hematoma no acute injury X-ray of elbow negative for acute injury CT spine negative for acute findings Brain MRI = acute ischemia left cingulate gyrus   Neurology saw the patient recommended circumscribed work-up as she was just recently admitted and had a full work-up  Her carotids were 1 to 39% stenosed not requiring any intervention MRI brain showed multifocal severe stenosis but stable MRI compared to prior  By the second day of  hospitalization she had improved and her slurring her speech had improved Neurology saw the patient-they concurred that she should be changed from aspirin and Plavix-->aspirin and Brilinta for 3 months and then  aspirin alone  It was noted that her A1c was 6.1 which is prediabetes-she should consider taking metformin or another hypoglycemic agent if she is able to tolerate  Therapy eval is pending but she can probably go to rehab on discharge  Discharge Exam: Vitals:   03/30/21 0413 03/30/21 0746  BP: (!) 145/65 (!) 160/72  Pulse: 77 78  Resp: 17 20  Temp: 98.6 F (37 C) 98 F (36.7 C)  SpO2: 93% 97%    Subj on day of d/c   Slightly disoriented otherwise pleasant  General Exam on discharge  EOMI NCAT no focal deficit today I do not appreciate any aphasia Chest is clear no added sound no rales no rhonchi S1-S2 no murmur no rub no gallop On monitors she has sinus with PVCs Abdomen is soft nontender She is able to follow commands and lift arms and legs off the bed  Discharge Instructions     Diet - low sodium heart healthy   Complete by: As directed    Increase activity slowly   Complete by: As directed       Allergies as of 03/30/2021   No Known Allergies      Medication List     STOP taking these medications    amLODipine 10 MG tablet Commonly known as: NORVASC   clopidogrel 75 MG tablet Commonly known as: PLAVIX   Proctozone-HC 2.5 % rectal cream Generic drug: hydrocortisone       TAKE these medications    aspirin 81 MG EC tablet Take 1 tablet (81 mg total) by mouth daily. Swallow whole.   atorvastatin 10 MG tablet Commonly known as: LIPITOR Take 10 mg by mouth daily.  ferrous sulfate 324 MG Tbec Take 324 mg by mouth in the morning and at bedtime.   hydrALAZINE 25 MG tablet Commonly known as: APRESOLINE Take 25 mg by mouth in the morning and at bedtime.   lisinopril 20 MG tablet Commonly known as: ZESTRIL Take 20 mg by mouth daily.   loperamide 2 MG tablet Commonly known as: Imodium A-D Take 1 tablet (2 mg total) by mouth 4 (four) times daily as needed for diarrhea or loose stools.   oxybutynin 5 MG tablet Commonly known as:  DITROPAN Take 5 mg by mouth in the morning and at bedtime.   pantoprazole 40 MG tablet Commonly known as: PROTONIX Take 40 mg by mouth daily.   PARoxetine 20 MG tablet Commonly known as: PAXIL Take 20 mg by mouth daily.   ticagrelor 90 MG Tabs tablet Commonly known as: BRILINTA Take 1 tablet (90 mg total) by mouth 2 (two) times daily.   traZODone 100 MG tablet Commonly known as: DESYREL Take 50 mg by mouth at bedtime.       No Known Allergies    The results of significant diagnostics from this hospitalization (including imaging, microbiology, ancillary and laboratory) are listed below for reference.    Significant Diagnostic Studies: DG Elbow Complete Left  Result Date: 03/29/2021 CLINICAL DATA:  Fall EXAM: LEFT ELBOW - COMPLETE 3+ VIEW COMPARISON:  None. FINDINGS: No fracture or dislocation is seen. The joint spaces are preserved. The visualized soft tissues are unremarkable. Lateral view is obliqued. No definite displaced fat pads to suggest an elbow joint effusion. IMPRESSION: Negative. Electronically Signed   By: Julian Hy M.D.   On: 03/29/2021 01:01   CT Head Wo Contrast  Result Date: 03/29/2021 CLINICAL DATA:  Head trauma, minor (Age >= 65y); Neck trauma (Age >= 65y). Multiple falls, head injury, chronic anticoagulation EXAM: CT HEAD WITHOUT CONTRAST CT CERVICAL SPINE WITHOUT CONTRAST TECHNIQUE: Multidetector CT imaging of the head and cervical spine was performed following the standard protocol without intravenous contrast. Multiplanar CT image reconstructions of the cervical spine were also generated. COMPARISON:  CT and MRI head 02/08/2021 FINDINGS: CT HEAD FINDINGS Brain: Interval evolution of multiple cortical infarcts within the left frontal lobe since prior examination. Mild parenchymal volume loss is commensurate with the patient's age. Mild periventricular white matter changes are present, most in keeping with the sequela of small vessel ischemia. No acute  intracranial hemorrhage or infarct. No abnormal mass effect or midline shift. No abnormal intra or extra-axial mass lesion. Ventricular size is normal. The cerebellum is unremarkable. Vascular: No asymmetric hyperdense vasculature at the skull base Skull: Intact Sinuses/Orbits: The paranasal sinuses are clear. The orbits are unremarkable save for removal of the ocular lenses. Other: Mastoid air cells and middle ear cavities are clear. Small right temporal scalp hematoma. CT CERVICAL SPINE FINDINGS Alignment: Straightening of the cervical spine noted. 3 mm anterolisthesis of C2 upon C3 is likely degenerative in nature. Skull base and vertebrae: The craniocervical alignment is normal. The atlantodental interval is not widened. There is no acute fracture of the cervical spine. Vertebral body height is preserved. Soft tissues and spinal canal: Posterior disc osteophyte complex ease in combination with congenital narrowing of the spinal canal result in moderate central canal stenosis at C5-6 and C6-7 with the AP diameter of the spinal canal of approximately 6-7 mm at minimum with mild resultant flattening of the thecal sac. No canal hematoma. No prevertebral soft tissue swelling or fluid. No pathologic cervical adenopathy. The visualized thyroid  is unremarkable peer Disc levels: There is intervertebral disc space narrowing and endplate remodeling of Q3-F3 in keeping with changes of severe degenerative disc disease. Milder degenerative changes are noted at C2-3 and C7-T1. The prevertebral soft tissues are not thickened on sagittal reformats. Review of the axial images demonstrates multilevel uncovertebral and facet arthrosis resulting in multilevel neuroforaminal narrowing, most severe at C3-4 bilaterally, C4-5 on the right, and C5-6 bilaterally Upper chest: Negative. Other: None IMPRESSION: No acute intracranial injury. No calvarial fracture. Small right temporal scalp hematoma. Interval evolution of multiple small left  frontal cortical infarcts. Stable senescent change No acute fracture or listhesis of the cervical spine. Advanced degenerative disc and degenerative joint disease resulting in moderate central canal stenosis and multilevel neuroforaminal narrowing as described above. Electronically Signed   By: Fidela Salisbury M.D.   On: 03/29/2021 00:49   CT Cervical Spine Wo Contrast  Result Date: 03/29/2021 CLINICAL DATA:  Head trauma, minor (Age >= 65y); Neck trauma (Age >= 65y). Multiple falls, head injury, chronic anticoagulation EXAM: CT HEAD WITHOUT CONTRAST CT CERVICAL SPINE WITHOUT CONTRAST TECHNIQUE: Multidetector CT imaging of the head and cervical spine was performed following the standard protocol without intravenous contrast. Multiplanar CT image reconstructions of the cervical spine were also generated. COMPARISON:  CT and MRI head 02/08/2021 FINDINGS: CT HEAD FINDINGS Brain: Interval evolution of multiple cortical infarcts within the left frontal lobe since prior examination. Mild parenchymal volume loss is commensurate with the patient's age. Mild periventricular white matter changes are present, most in keeping with the sequela of small vessel ischemia. No acute intracranial hemorrhage or infarct. No abnormal mass effect or midline shift. No abnormal intra or extra-axial mass lesion. Ventricular size is normal. The cerebellum is unremarkable. Vascular: No asymmetric hyperdense vasculature at the skull base Skull: Intact Sinuses/Orbits: The paranasal sinuses are clear. The orbits are unremarkable save for removal of the ocular lenses. Other: Mastoid air cells and middle ear cavities are clear. Small right temporal scalp hematoma. CT CERVICAL SPINE FINDINGS Alignment: Straightening of the cervical spine noted. 3 mm anterolisthesis of C2 upon C3 is likely degenerative in nature. Skull base and vertebrae: The craniocervical alignment is normal. The atlantodental interval is not widened. There is no acute fracture  of the cervical spine. Vertebral body height is preserved. Soft tissues and spinal canal: Posterior disc osteophyte complex ease in combination with congenital narrowing of the spinal canal result in moderate central canal stenosis at C5-6 and C6-7 with the AP diameter of the spinal canal of approximately 6-7 mm at minimum with mild resultant flattening of the thecal sac. No canal hematoma. No prevertebral soft tissue swelling or fluid. No pathologic cervical adenopathy. The visualized thyroid is unremarkable peer Disc levels: There is intervertebral disc space narrowing and endplate remodeling of L4-T6 in keeping with changes of severe degenerative disc disease. Milder degenerative changes are noted at C2-3 and C7-T1. The prevertebral soft tissues are not thickened on sagittal reformats. Review of the axial images demonstrates multilevel uncovertebral and facet arthrosis resulting in multilevel neuroforaminal narrowing, most severe at C3-4 bilaterally, C4-5 on the right, and C5-6 bilaterally Upper chest: Negative. Other: None IMPRESSION: No acute intracranial injury. No calvarial fracture. Small right temporal scalp hematoma. Interval evolution of multiple small left frontal cortical infarcts. Stable senescent change No acute fracture or listhesis of the cervical spine. Advanced degenerative disc and degenerative joint disease resulting in moderate central canal stenosis and multilevel neuroforaminal narrowing as described above. Electronically Signed   By: Cassandria Anger  Christa See M.D.   On: 03/29/2021 00:49   MR ANGIO HEAD WO CONTRAST  Result Date: 03/29/2021 CLINICAL DATA:  Neuro deficit, acute, stroke suspected. EXAM: MRA HEAD WITHOUT CONTRAST TECHNIQUE: Angiographic images of the Circle of Willis were acquired using MRA technique without intravenous contrast. COMPARISON:  Same-day brain MRI 03/29/2021.  MRA head 02/08/2021. FINDINGS: Anterior circulation: The intracranial internal carotid arteries are patent. The M1  middle cerebral arteries are patent. No M2 proximal branch occlusion or high-grade proximal stenosis is identified. The anterior cerebral arteries are patent. Redemonstrated multifocal severe stenoses within the bilateral A2/A3 anterior cerebral arteries. 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 Redemonstrated 2 mm inferiorly projecting vascular protrusion arising from the supraclinoid left ICA, which may reflect an aneurysm or infundibulum. (Series 257, image 204). Posterior circulation: The intracranial vertebral arteries are patent. The right vertebral artery is dominant. The basilar artery is patent. The posterior cerebral arteries are patent. A right posterior communicating artery is present. The left posterior communicating artery is hypoplastic or absent. Anatomic variants: As described. IMPRESSION: Stable MRA of the head as compared 02/08/2021. Multifocal severe stenoses within the A2/A3 segments of the bilateral anterior cerebral arteries. 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 2 mm inferiorly projecting vascular protrusion arising from the supraclinoid left ICA, which may reflect an aneurysm or infundibulum. Electronically Signed   By: Kellie Simmering D.O.   On: 03/29/2021 10:34   MR BRAIN WO CONTRAST  Result Date: 03/29/2021 CLINICAL DATA:  Multiple falls EXAM: MRI HEAD WITHOUT CONTRAST TECHNIQUE: Multiplanar, multiecho pulse sequences of the brain and surrounding structures were obtained without intravenous contrast. COMPARISON:  02/08/2021 FINDINGS: Brain: Expected evolution of diffusion signal changes within the bilateral corona radiata. There is a new area of abnormal diffusion restriction of the left cingulate gyrus. No acute or chronic hemorrhage. There is multifocal hyperintense T2-weighted signal within the white matter. Generalized volume loss without a clear lobar  predilection. The midline structures are normal. Vascular: Major flow voids are preserved. Skull and upper cervical spine: Normal calvarium and skull base. Visualized upper cervical spine and soft tissues are normal. Sinuses/Orbits:No paranasal sinus fluid levels or advanced mucosal thickening. No mastoid or middle ear effusion. Normal orbits. IMPRESSION: 1. New area of abnormal diffusion restriction of the left cingulate gyrus, consistent with acute ischemia. No hemorrhage or mass effect. 2. Expected evolution of subacute bilateral corona radiata infarcts. 3. Chronic small vessel ischemic disease and volume loss. Electronically Signed   By: Ulyses Jarred M.D.   On: 03/29/2021 03:16   DG Pelvis Portable  Result Date: 03/29/2021 CLINICAL DATA:  Fall, pelvic trauma EXAM: PORTABLE PELVIS 1-2 VIEWS COMPARISON:  None. FINDINGS: Normal alignment. No fracture or dislocation. Mild bilateral degenerative hip arthritis is present. Soft tissues are unremarkable. IMPRESSION: No acute fracture or dislocation. Electronically Signed   By: Fidela Salisbury M.D.   On: 03/29/2021 00:34   DG Chest Port 1 View  Result Date: 03/29/2021 CLINICAL DATA:  Fall, chest trauma EXAM: PORTABLE CHEST 1 VIEW COMPARISON:  None. FINDINGS: Lungs are clear. No pneumothorax or pleural effusion. Cardiac size is within normal limits. Pulmonary vascularity is normal. Surgical clips are seen within the right axilla. No acute bone abnormality. IMPRESSION: No active disease. Electronically Signed   By: Fidela Salisbury M.D.   On: 03/29/2021 00:34   VAS US CAROTID  Result Date: 03/29/2021 Carotid Arterial Duplex Study Patient Name:  [facility]  Date of Exam:   03/29/2021 Medical Rec #: [id number]      Accession #:    [id number] Date of Birth: 1931-05-25      Patient Gender: F Patient Age:   64 years Exam Location:  [facility] Procedure:      VAS US CAROTID Referring Phys: Alferd Patee [facility]  --------------------------------------------------------------------------------  Indications:       CVA. Risk Factors:      Hypertension, hyperlipidemia. Comparison Study:  No prior studies. Performing Technologist: Darlin Coco RDMS, RVT  Examination Guidelines: A complete evaluation includes B-mode imaging, spectral Doppler, color Doppler, and power Doppler as needed of all accessible portions of each vessel. Bilateral testing is considered an integral part of a complete examination. Limited examinations for reoccurring indications may be performed as noted.  Right Carotid Findings: +----------+--------+--------+--------+----------------------------+--------+           PSV cm/sEDV cm/sStenosisPlaque Description          Comments +----------+--------+--------+--------+----------------------------+--------+ CCA Prox  116     15                                                   +----------+--------+--------+--------+----------------------------+--------+ CCA Distal55      9               hyperechoic and heterogenous         +----------+--------+--------+--------+----------------------------+--------+ ICA Prox  45      12      1-39%   heterogenous                         +----------+--------+--------+--------+----------------------------+--------+ ICA Distal102     30                                          tortuous +----------+--------+--------+--------+----------------------------+--------+ ECA       78      7                                                    +----------+--------+--------+--------+----------------------------+--------+ +----------+--------+-------+----------------+-------------------+           PSV cm/sEDV cmsDescribe        Arm Pressure (mmHG) +----------+--------+-------+----------------+-------------------+ [id number]            Multiphasic, WNL                    +----------+--------+-------+----------------+-------------------+  +---------+--------+--+--------+--+---------+ VertebralPSV cm/s62EDV cm/s11Antegrade +---------+--------+--+--------+--+---------+  Left Carotid Findings: +----------+--------+--------+--------+------------------+--------+           PSV cm/sEDV cm/sStenosisPlaque DescriptionComments +----------+--------+--------+--------+------------------+--------+ CCA Prox  102     16                                         +----------+--------+--------+--------+------------------+--------+ CCA Distal57      13                                         +----------+--------+--------+--------+------------------+--------+  ICA Prox  58      26      1-39%   heterogenous               +----------+--------+--------+--------+------------------+--------+ ICA Distal79      26                                         +----------+--------+--------+--------+------------------+--------+ ECA       94      11                                         +----------+--------+--------+--------+------------------+--------+ +----------+--------+--------+----------------+-------------------+           PSV cm/sEDV cm/sDescribe        Arm Pressure (mmHG) +----------+--------+--------+----------------+-------------------+ Subclavian172             Multiphasic, WNL                    +----------+--------+--------+----------------+-------------------+ +---------+--------+--+--------+--+---------+ VertebralPSV cm/s78EDV cm/s23Antegrade +---------+--------+--+--------+--+---------+   Summary: Right Carotid: Velocities in the right ICA are consistent with a 1-39% stenosis. Left Carotid: Velocities in the left ICA are consistent with a 1-39% stenosis. Vertebrals:  Bilateral vertebral arteries demonstrate antegrade flow. Subclavians: Normal flow hemodynamics were seen in bilateral subclavian              arteries. *See table(s) above for measurements and observations.  Electronically signed by Antony Contras MD on 03/29/2021 at 4:49:54 PM.    Final     Microbiology: Recent Results (from the past 240 hour(s))  SARS CORONAVIRUS 2 (TAT 6-24 HRS) Nasopharyngeal Nasopharyngeal Swab     Status: None   Collection Time: 03/29/21  3:29 AM   Specimen: Nasopharyngeal Swab  Result Value Ref Range Status   SARS Coronavirus 2 NEGATIVE NEGATIVE Final    Comment: (NOTE) SARS-CoV-2 target nucleic acids are NOT DETECTED.  The SARS-CoV-2 RNA is generally detectable in upper and lower respiratory specimens during the acute phase of infection. Negative results do not preclude SARS-CoV-2 infection, do not rule out co-infections with other pathogens, and should not be used as the sole basis for treatment or other patient management decisions. Negative results must be combined with clinical observations, patient history, and epidemiological information. The expected result is Negative.  Fact Sheet for Patients: SugarRoll.be  Fact Sheet for Healthcare Providers: https://www.woods-mathews.com/  This test is not yet approved or cleared by the [place] FDA and  has been authorized for detection and/or diagnosis of SARS-CoV-2 by FDA under an Emergency Use Authorization (EUA). This EUA will remain  in effect (meaning this test can be used) for the duration of the COVID-19 declaration under Se ction 564(b)(1) of the Act, 21 U.S.C. section 360bbb-3(b)(1), unless the authorization is terminated or revoked sooner.  Performed at [facility], [place] [address] [place], [place] [address]      Labs: Basic Metabolic Panel: Recent Labs  Lab 03/28/21 2351  NA 134*  K 4.1  CL 104  CO2 21*  GLUCOSE 108*  BUN 23  CREATININE 1.40*  CALCIUM 9.9  MG 1.9   Liver Function Tests: Recent Labs  Lab 03/28/21 2351  AST 17  ALT 13  ALKPHOS 62  BILITOT 0.7  PROT 6.2*  ALBUMIN 3.5   No results for input(s): LIPASE, AMYLASE  in the last 168 hours. No results  for input(s): AMMONIA in the last 168 hours. CBC: Recent Labs  Lab 03/28/21 2351  WBC 8.2  NEUTROABS 5.0  HGB 7.9*  HCT 26.8*  MCV 86.5  PLT 392   Cardiac Enzymes: No results for input(s): CKTOTAL, CKMB, CKMBINDEX, TROPONINI in the last 168 hours. BNP: BNP (last 3 results) Recent Labs    03/28/21 2353  BNP 63.1    ProBNP (last 3 results) No results for input(s): PROBNP in the last 8760 hours.  CBG: No results for input(s): GLUCAP in the last 168 hours.     Signed:  Nita Sells MD   [facility] 03/30/2021, 8:21 AM

## 2021-03-30 NOTE — Progress Notes (Signed)
I was paged by patient's RN requesting assistance with printing of AVS. She conveyed that the patient is currently being discharged but was having difficultly with printing of AVS, and requested assistance with this. I reviewed associated discharge summary, confirming intent to discharge, and was able to print AVS. I subsequently called patient's RN back, confirming that AVS has now been printed.     Babs Bertin, DO Hospitalist

## 2021-03-30 NOTE — NC FL2 (Signed)
[place] LEVEL OF CARE SCREENING TOOL     IDENTIFICATION  Patient Name: Lindsay Martinez Birthdate: 10-05-1930 Sex: female Admission Date (Current Location): 03/28/2021  [place] and [place] Number:  Herbalist and Address:  [facility], [place] [address], [place], [place] [address]      Provider Number: [id number]  Attending Physician Name and Address:  Nita Sells, MD  Relative Name and Phone Number:       Current Level of Care: Hospital Recommended Level of Care: [place] Prior Approval Number:    Date Approved/Denied:   PASRR Number: [id number]  Discharge Plan: SNF    Current Diagnoses: Patient Active Problem List   Diagnosis Date Noted   Acute CVA (cerebrovascular accident) ([place]) 03/29/2021   Diarrhea 03/29/2021   Anemia 03/29/2021   Essential hypertension 03/29/2021   Hyperlipidemia 03/29/2021    Orientation RESPIRATION BLADDER Height & Weight     Self, Time, Situation, Place  Normal Incontinent Weight: 78 kg Height:  4\' 9"  (144.8 cm)  BEHAVIORAL SYMPTOMS/MOOD NEUROLOGICAL BOWEL NUTRITION STATUS      Continent Diet (heart healthy/ carb modified with thin liquids)  AMBULATORY STATUS COMMUNICATION OF NEEDS Skin   Limited Assist Verbally Skin abrasions, Bruising (skin abrasion to rt hand/ ecchymosis to bilateral arms and legs)                       Personal Care Assistance Level of Assistance  Bathing, Feeding, Dressing Bathing Assistance: Limited assistance Feeding assistance: Independent Dressing Assistance: Limited assistance     Functional Limitations Info  Sight, Hearing, Speech Sight Info: Adequate Hearing Info: Adequate Speech Info: Impaired (slur)    [place]  PT (By licensed PT), OT (By licensed OT), Speech therapy     PT Frequency: 5x/wk OT Frequency: 5x/wk     Speech Therapy Frequency: 5x/wk      Contractures Contractures Info: Not  present    Additional Factors Info  Code Status, Allergies, Psychotropic Code Status Info: Full Allergies Info: NKA Psychotropic Info: Paxil 20 mg daily/ trazadone 50 mg at bedtime         Current Medications (03/30/2021):  This is the current hospital active medication list Current Facility-Administered Medications  Medication Dose Route Frequency Provider Last Rate Last Admin   acetaminophen (TYLENOL) tablet 650 mg  650 mg Oral Q4H PRN Shela Leff, MD       Or   acetaminophen (TYLENOL) 160 MG/5ML solution 650 mg  650 mg Per Tube Q4H PRN Shela Leff, MD       Or   acetaminophen (TYLENOL) suppository 650 mg  650 mg Rectal Q4H PRN Shela Leff, MD       aspirin EC tablet 81 mg  81 mg Oral Daily Leonie Man, Pramod S, MD       atorvastatin (LIPITOR) tablet 10 mg  10 mg Oral Daily Shela Leff, MD   10 mg at 03/29/21 1203   oxybutynin (DITROPAN) tablet 5 mg  5 mg Oral BID Shela Leff, MD   5 mg at 03/29/21 2200   pantoprazole (PROTONIX) EC tablet 40 mg  40 mg Oral Daily Shela Leff, MD   40 mg at 03/29/21 1203   PARoxetine (PAXIL) tablet 20 mg  20 mg Oral Daily Shela Leff, MD   20 mg at 03/29/21 1204   ticagrelor (BRILINTA) tablet 90 mg  90 mg Oral BID Garvin Fila, MD  traZODone (DESYREL) tablet 50 mg  50 mg Oral QHS Shela Leff, MD   50 mg at 03/29/21 2200     Discharge Medications: Please see discharge summary for a list of discharge medications.  Relevant Imaging Results:  Relevant Lab Results:   Additional Information SS#: [id number]  Pollie Friar, RN

## 2021-03-31 DIAGNOSIS — I4891 Unspecified atrial fibrillation: Secondary | ICD-10-CM | POA: Diagnosis not present

## 2021-03-31 DIAGNOSIS — I639 Cerebral infarction, unspecified: Secondary | ICD-10-CM | POA: Diagnosis not present

## 2021-04-01 NOTE — Progress Notes (Signed)
I agree with the above plan 

## 2021-04-02 ENCOUNTER — Encounter: Payer: Self-pay | Admitting: Adult Health

## 2021-04-02 ENCOUNTER — Ambulatory Visit (INDEPENDENT_AMBULATORY_CARE_PROVIDER_SITE_OTHER): Payer: Medicare Other

## 2021-04-02 DIAGNOSIS — F32A Depression, unspecified: Secondary | ICD-10-CM | POA: Diagnosis not present

## 2021-04-02 DIAGNOSIS — I639 Cerebral infarction, unspecified: Secondary | ICD-10-CM

## 2021-04-02 DIAGNOSIS — I6789 Other cerebrovascular disease: Secondary | ICD-10-CM | POA: Diagnosis not present

## 2021-04-02 DIAGNOSIS — R531 Weakness: Secondary | ICD-10-CM | POA: Diagnosis not present

## 2021-04-02 DIAGNOSIS — G479 Sleep disorder, unspecified: Secondary | ICD-10-CM | POA: Diagnosis not present

## 2021-04-02 DIAGNOSIS — R2689 Other abnormalities of gait and mobility: Secondary | ICD-10-CM | POA: Diagnosis not present

## 2021-04-02 DIAGNOSIS — R2681 Unsteadiness on feet: Secondary | ICD-10-CM | POA: Diagnosis not present

## 2021-04-02 DIAGNOSIS — N179 Acute kidney failure, unspecified: Secondary | ICD-10-CM | POA: Diagnosis not present

## 2021-04-02 DIAGNOSIS — E119 Type 2 diabetes mellitus without complications: Secondary | ICD-10-CM | POA: Diagnosis not present

## 2021-04-02 DIAGNOSIS — K219 Gastro-esophageal reflux disease without esophagitis: Secondary | ICD-10-CM | POA: Diagnosis not present

## 2021-04-02 DIAGNOSIS — I4891 Unspecified atrial fibrillation: Secondary | ICD-10-CM

## 2021-04-02 DIAGNOSIS — R296 Repeated falls: Secondary | ICD-10-CM | POA: Diagnosis not present

## 2021-04-02 DIAGNOSIS — E785 Hyperlipidemia, unspecified: Secondary | ICD-10-CM | POA: Diagnosis not present

## 2021-04-02 DIAGNOSIS — N1831 Chronic kidney disease, stage 3a: Secondary | ICD-10-CM | POA: Diagnosis not present

## 2021-04-02 DIAGNOSIS — I10 Essential (primary) hypertension: Secondary | ICD-10-CM | POA: Diagnosis not present

## 2021-04-06 DIAGNOSIS — I639 Cerebral infarction, unspecified: Secondary | ICD-10-CM | POA: Diagnosis not present

## 2021-04-06 DIAGNOSIS — E1122 Type 2 diabetes mellitus with diabetic chronic kidney disease: Secondary | ICD-10-CM | POA: Diagnosis not present

## 2021-04-06 DIAGNOSIS — R269 Unspecified abnormalities of gait and mobility: Secondary | ICD-10-CM | POA: Diagnosis not present

## 2021-04-09 DIAGNOSIS — E785 Hyperlipidemia, unspecified: Secondary | ICD-10-CM | POA: Diagnosis not present

## 2021-04-09 DIAGNOSIS — I10 Essential (primary) hypertension: Secondary | ICD-10-CM | POA: Diagnosis not present

## 2021-04-09 DIAGNOSIS — N1831 Chronic kidney disease, stage 3a: Secondary | ICD-10-CM | POA: Diagnosis not present

## 2021-04-17 DIAGNOSIS — F32A Depression, unspecified: Secondary | ICD-10-CM | POA: Diagnosis not present

## 2021-04-17 DIAGNOSIS — I639 Cerebral infarction, unspecified: Secondary | ICD-10-CM | POA: Diagnosis not present

## 2021-04-17 DIAGNOSIS — R531 Weakness: Secondary | ICD-10-CM | POA: Diagnosis not present

## 2021-04-17 DIAGNOSIS — U071 COVID-19: Secondary | ICD-10-CM | POA: Diagnosis not present

## 2021-04-19 DIAGNOSIS — R531 Weakness: Secondary | ICD-10-CM | POA: Diagnosis not present

## 2021-04-19 DIAGNOSIS — F32A Depression, unspecified: Secondary | ICD-10-CM | POA: Diagnosis not present

## 2021-04-19 DIAGNOSIS — I639 Cerebral infarction, unspecified: Secondary | ICD-10-CM | POA: Diagnosis not present

## 2021-04-25 DIAGNOSIS — N1832 Chronic kidney disease, stage 3b: Secondary | ICD-10-CM | POA: Diagnosis not present

## 2021-04-25 DIAGNOSIS — E782 Mixed hyperlipidemia: Secondary | ICD-10-CM | POA: Diagnosis not present

## 2021-04-25 DIAGNOSIS — Z09 Encounter for follow-up examination after completed treatment for conditions other than malignant neoplasm: Secondary | ICD-10-CM | POA: Diagnosis not present

## 2021-04-25 DIAGNOSIS — R5381 Other malaise: Secondary | ICD-10-CM | POA: Diagnosis not present

## 2021-04-25 DIAGNOSIS — N3281 Overactive bladder: Secondary | ICD-10-CM | POA: Diagnosis not present

## 2021-04-25 DIAGNOSIS — R413 Other amnesia: Secondary | ICD-10-CM | POA: Diagnosis not present

## 2021-04-25 DIAGNOSIS — I69320 Aphasia following cerebral infarction: Secondary | ICD-10-CM | POA: Diagnosis not present

## 2021-04-25 DIAGNOSIS — F5101 Primary insomnia: Secondary | ICD-10-CM | POA: Diagnosis not present

## 2021-04-25 DIAGNOSIS — I10 Essential (primary) hypertension: Secondary | ICD-10-CM | POA: Diagnosis not present

## 2021-04-26 DIAGNOSIS — K219 Gastro-esophageal reflux disease without esophagitis: Secondary | ICD-10-CM | POA: Diagnosis not present

## 2021-04-26 DIAGNOSIS — Z7902 Long term (current) use of antithrombotics/antiplatelets: Secondary | ICD-10-CM | POA: Diagnosis not present

## 2021-04-26 DIAGNOSIS — Z9181 History of falling: Secondary | ICD-10-CM | POA: Diagnosis not present

## 2021-04-26 DIAGNOSIS — D631 Anemia in chronic kidney disease: Secondary | ICD-10-CM | POA: Diagnosis not present

## 2021-04-26 DIAGNOSIS — F0394 Unspecified dementia, unspecified severity, with anxiety: Secondary | ICD-10-CM | POA: Diagnosis not present

## 2021-04-26 DIAGNOSIS — E785 Hyperlipidemia, unspecified: Secondary | ICD-10-CM | POA: Diagnosis not present

## 2021-04-26 DIAGNOSIS — Z7982 Long term (current) use of aspirin: Secondary | ICD-10-CM | POA: Diagnosis not present

## 2021-04-26 DIAGNOSIS — I69320 Aphasia following cerebral infarction: Secondary | ICD-10-CM | POA: Diagnosis not present

## 2021-04-26 DIAGNOSIS — N189 Chronic kidney disease, unspecified: Secondary | ICD-10-CM | POA: Diagnosis not present

## 2021-04-26 DIAGNOSIS — F0393 Unspecified dementia, unspecified severity, with mood disturbance: Secondary | ICD-10-CM | POA: Diagnosis not present

## 2021-04-26 DIAGNOSIS — E1122 Type 2 diabetes mellitus with diabetic chronic kidney disease: Secondary | ICD-10-CM | POA: Diagnosis not present

## 2021-04-26 DIAGNOSIS — F32A Depression, unspecified: Secondary | ICD-10-CM | POA: Diagnosis not present

## 2021-04-26 DIAGNOSIS — I10 Essential (primary) hypertension: Secondary | ICD-10-CM | POA: Diagnosis not present

## 2021-04-28 DIAGNOSIS — D649 Anemia, unspecified: Secondary | ICD-10-CM | POA: Diagnosis not present

## 2021-04-28 DIAGNOSIS — N1832 Chronic kidney disease, stage 3b: Secondary | ICD-10-CM | POA: Diagnosis not present

## 2021-04-28 DIAGNOSIS — I10 Essential (primary) hypertension: Secondary | ICD-10-CM | POA: Diagnosis not present

## 2021-04-28 DIAGNOSIS — E782 Mixed hyperlipidemia: Secondary | ICD-10-CM | POA: Diagnosis not present

## 2021-04-28 DIAGNOSIS — E1169 Type 2 diabetes mellitus with other specified complication: Secondary | ICD-10-CM | POA: Diagnosis not present

## 2021-04-28 DIAGNOSIS — K219 Gastro-esophageal reflux disease without esophagitis: Secondary | ICD-10-CM | POA: Diagnosis not present

## 2021-05-02 ENCOUNTER — Other Ambulatory Visit: Payer: Self-pay | Admitting: Cardiology

## 2021-05-02 DIAGNOSIS — I4891 Unspecified atrial fibrillation: Secondary | ICD-10-CM

## 2021-05-02 DIAGNOSIS — I639 Cerebral infarction, unspecified: Secondary | ICD-10-CM

## 2021-05-03 DIAGNOSIS — Z7982 Long term (current) use of aspirin: Secondary | ICD-10-CM | POA: Diagnosis not present

## 2021-05-03 DIAGNOSIS — E1122 Type 2 diabetes mellitus with diabetic chronic kidney disease: Secondary | ICD-10-CM | POA: Diagnosis not present

## 2021-05-03 DIAGNOSIS — K219 Gastro-esophageal reflux disease without esophagitis: Secondary | ICD-10-CM | POA: Diagnosis not present

## 2021-05-03 DIAGNOSIS — E785 Hyperlipidemia, unspecified: Secondary | ICD-10-CM | POA: Diagnosis not present

## 2021-05-03 DIAGNOSIS — Z9181 History of falling: Secondary | ICD-10-CM | POA: Diagnosis not present

## 2021-05-03 DIAGNOSIS — F32A Depression, unspecified: Secondary | ICD-10-CM | POA: Diagnosis not present

## 2021-05-03 DIAGNOSIS — N189 Chronic kidney disease, unspecified: Secondary | ICD-10-CM | POA: Diagnosis not present

## 2021-05-03 DIAGNOSIS — D631 Anemia in chronic kidney disease: Secondary | ICD-10-CM | POA: Diagnosis not present

## 2021-05-03 DIAGNOSIS — Z7902 Long term (current) use of antithrombotics/antiplatelets: Secondary | ICD-10-CM | POA: Diagnosis not present

## 2021-05-03 DIAGNOSIS — F0394 Unspecified dementia, unspecified severity, with anxiety: Secondary | ICD-10-CM | POA: Diagnosis not present

## 2021-05-03 DIAGNOSIS — F0393 Unspecified dementia, unspecified severity, with mood disturbance: Secondary | ICD-10-CM | POA: Diagnosis not present

## 2021-05-03 DIAGNOSIS — I10 Essential (primary) hypertension: Secondary | ICD-10-CM | POA: Diagnosis not present

## 2021-05-03 DIAGNOSIS — I69320 Aphasia following cerebral infarction: Secondary | ICD-10-CM | POA: Diagnosis not present

## 2021-05-09 DIAGNOSIS — D631 Anemia in chronic kidney disease: Secondary | ICD-10-CM | POA: Diagnosis not present

## 2021-05-09 DIAGNOSIS — E785 Hyperlipidemia, unspecified: Secondary | ICD-10-CM | POA: Diagnosis not present

## 2021-05-09 DIAGNOSIS — I10 Essential (primary) hypertension: Secondary | ICD-10-CM | POA: Diagnosis not present

## 2021-05-09 DIAGNOSIS — Z7982 Long term (current) use of aspirin: Secondary | ICD-10-CM | POA: Diagnosis not present

## 2021-05-09 DIAGNOSIS — F32A Depression, unspecified: Secondary | ICD-10-CM | POA: Diagnosis not present

## 2021-05-09 DIAGNOSIS — Z7902 Long term (current) use of antithrombotics/antiplatelets: Secondary | ICD-10-CM | POA: Diagnosis not present

## 2021-05-09 DIAGNOSIS — Z9181 History of falling: Secondary | ICD-10-CM | POA: Diagnosis not present

## 2021-05-09 DIAGNOSIS — E1122 Type 2 diabetes mellitus with diabetic chronic kidney disease: Secondary | ICD-10-CM | POA: Diagnosis not present

## 2021-05-09 DIAGNOSIS — I69320 Aphasia following cerebral infarction: Secondary | ICD-10-CM | POA: Diagnosis not present

## 2021-05-09 DIAGNOSIS — K219 Gastro-esophageal reflux disease without esophagitis: Secondary | ICD-10-CM | POA: Diagnosis not present

## 2021-05-09 DIAGNOSIS — F0393 Unspecified dementia, unspecified severity, with mood disturbance: Secondary | ICD-10-CM | POA: Diagnosis not present

## 2021-05-09 DIAGNOSIS — F0394 Unspecified dementia, unspecified severity, with anxiety: Secondary | ICD-10-CM | POA: Diagnosis not present

## 2021-05-09 DIAGNOSIS — N189 Chronic kidney disease, unspecified: Secondary | ICD-10-CM | POA: Diagnosis not present

## 2021-05-10 DIAGNOSIS — F32A Depression, unspecified: Secondary | ICD-10-CM | POA: Diagnosis not present

## 2021-05-10 DIAGNOSIS — Z7982 Long term (current) use of aspirin: Secondary | ICD-10-CM | POA: Diagnosis not present

## 2021-05-10 DIAGNOSIS — N189 Chronic kidney disease, unspecified: Secondary | ICD-10-CM | POA: Diagnosis not present

## 2021-05-10 DIAGNOSIS — I69320 Aphasia following cerebral infarction: Secondary | ICD-10-CM | POA: Diagnosis not present

## 2021-05-10 DIAGNOSIS — F0393 Unspecified dementia, unspecified severity, with mood disturbance: Secondary | ICD-10-CM | POA: Diagnosis not present

## 2021-05-10 DIAGNOSIS — K219 Gastro-esophageal reflux disease without esophagitis: Secondary | ICD-10-CM | POA: Diagnosis not present

## 2021-05-10 DIAGNOSIS — E1122 Type 2 diabetes mellitus with diabetic chronic kidney disease: Secondary | ICD-10-CM | POA: Diagnosis not present

## 2021-05-10 DIAGNOSIS — Z9181 History of falling: Secondary | ICD-10-CM | POA: Diagnosis not present

## 2021-05-10 DIAGNOSIS — I10 Essential (primary) hypertension: Secondary | ICD-10-CM | POA: Diagnosis not present

## 2021-05-10 DIAGNOSIS — E785 Hyperlipidemia, unspecified: Secondary | ICD-10-CM | POA: Diagnosis not present

## 2021-05-10 DIAGNOSIS — F0394 Unspecified dementia, unspecified severity, with anxiety: Secondary | ICD-10-CM | POA: Diagnosis not present

## 2021-05-10 DIAGNOSIS — D631 Anemia in chronic kidney disease: Secondary | ICD-10-CM | POA: Diagnosis not present

## 2021-05-10 DIAGNOSIS — Z7902 Long term (current) use of antithrombotics/antiplatelets: Secondary | ICD-10-CM | POA: Diagnosis not present

## 2021-05-11 DIAGNOSIS — E1122 Type 2 diabetes mellitus with diabetic chronic kidney disease: Secondary | ICD-10-CM | POA: Diagnosis not present

## 2021-05-11 DIAGNOSIS — E785 Hyperlipidemia, unspecified: Secondary | ICD-10-CM | POA: Diagnosis not present

## 2021-05-11 DIAGNOSIS — D631 Anemia in chronic kidney disease: Secondary | ICD-10-CM | POA: Diagnosis not present

## 2021-05-11 DIAGNOSIS — Z7982 Long term (current) use of aspirin: Secondary | ICD-10-CM | POA: Diagnosis not present

## 2021-05-11 DIAGNOSIS — N189 Chronic kidney disease, unspecified: Secondary | ICD-10-CM | POA: Diagnosis not present

## 2021-05-11 DIAGNOSIS — Z9181 History of falling: Secondary | ICD-10-CM | POA: Diagnosis not present

## 2021-05-11 DIAGNOSIS — I69320 Aphasia following cerebral infarction: Secondary | ICD-10-CM | POA: Diagnosis not present

## 2021-05-11 DIAGNOSIS — Z7902 Long term (current) use of antithrombotics/antiplatelets: Secondary | ICD-10-CM | POA: Diagnosis not present

## 2021-05-11 DIAGNOSIS — I10 Essential (primary) hypertension: Secondary | ICD-10-CM | POA: Diagnosis not present

## 2021-05-11 DIAGNOSIS — F0394 Unspecified dementia, unspecified severity, with anxiety: Secondary | ICD-10-CM | POA: Diagnosis not present

## 2021-05-11 DIAGNOSIS — F0393 Unspecified dementia, unspecified severity, with mood disturbance: Secondary | ICD-10-CM | POA: Diagnosis not present

## 2021-05-11 DIAGNOSIS — K219 Gastro-esophageal reflux disease without esophagitis: Secondary | ICD-10-CM | POA: Diagnosis not present

## 2021-05-11 DIAGNOSIS — F32A Depression, unspecified: Secondary | ICD-10-CM | POA: Diagnosis not present

## 2021-05-17 DIAGNOSIS — F0393 Unspecified dementia, unspecified severity, with mood disturbance: Secondary | ICD-10-CM | POA: Diagnosis not present

## 2021-05-17 DIAGNOSIS — N189 Chronic kidney disease, unspecified: Secondary | ICD-10-CM | POA: Diagnosis not present

## 2021-05-17 DIAGNOSIS — F32A Depression, unspecified: Secondary | ICD-10-CM | POA: Diagnosis not present

## 2021-05-17 DIAGNOSIS — E1122 Type 2 diabetes mellitus with diabetic chronic kidney disease: Secondary | ICD-10-CM | POA: Diagnosis not present

## 2021-05-17 DIAGNOSIS — I69320 Aphasia following cerebral infarction: Secondary | ICD-10-CM | POA: Diagnosis not present

## 2021-05-17 DIAGNOSIS — Z7902 Long term (current) use of antithrombotics/antiplatelets: Secondary | ICD-10-CM | POA: Diagnosis not present

## 2021-05-17 DIAGNOSIS — K219 Gastro-esophageal reflux disease without esophagitis: Secondary | ICD-10-CM | POA: Diagnosis not present

## 2021-05-17 DIAGNOSIS — F0394 Unspecified dementia, unspecified severity, with anxiety: Secondary | ICD-10-CM | POA: Diagnosis not present

## 2021-05-17 DIAGNOSIS — E785 Hyperlipidemia, unspecified: Secondary | ICD-10-CM | POA: Diagnosis not present

## 2021-05-17 DIAGNOSIS — Z9181 History of falling: Secondary | ICD-10-CM | POA: Diagnosis not present

## 2021-05-17 DIAGNOSIS — Z7982 Long term (current) use of aspirin: Secondary | ICD-10-CM | POA: Diagnosis not present

## 2021-05-17 DIAGNOSIS — D631 Anemia in chronic kidney disease: Secondary | ICD-10-CM | POA: Diagnosis not present

## 2021-05-17 DIAGNOSIS — I10 Essential (primary) hypertension: Secondary | ICD-10-CM | POA: Diagnosis not present

## 2021-05-18 DIAGNOSIS — F0393 Unspecified dementia, unspecified severity, with mood disturbance: Secondary | ICD-10-CM | POA: Diagnosis not present

## 2021-05-18 DIAGNOSIS — F32A Depression, unspecified: Secondary | ICD-10-CM | POA: Diagnosis not present

## 2021-05-18 DIAGNOSIS — E1122 Type 2 diabetes mellitus with diabetic chronic kidney disease: Secondary | ICD-10-CM | POA: Diagnosis not present

## 2021-05-18 DIAGNOSIS — Z7982 Long term (current) use of aspirin: Secondary | ICD-10-CM | POA: Diagnosis not present

## 2021-05-18 DIAGNOSIS — E785 Hyperlipidemia, unspecified: Secondary | ICD-10-CM | POA: Diagnosis not present

## 2021-05-18 DIAGNOSIS — F0394 Unspecified dementia, unspecified severity, with anxiety: Secondary | ICD-10-CM | POA: Diagnosis not present

## 2021-05-18 DIAGNOSIS — Z7902 Long term (current) use of antithrombotics/antiplatelets: Secondary | ICD-10-CM | POA: Diagnosis not present

## 2021-05-18 DIAGNOSIS — Z9181 History of falling: Secondary | ICD-10-CM | POA: Diagnosis not present

## 2021-05-18 DIAGNOSIS — I10 Essential (primary) hypertension: Secondary | ICD-10-CM | POA: Diagnosis not present

## 2021-05-18 DIAGNOSIS — D631 Anemia in chronic kidney disease: Secondary | ICD-10-CM | POA: Diagnosis not present

## 2021-05-18 DIAGNOSIS — I69320 Aphasia following cerebral infarction: Secondary | ICD-10-CM | POA: Diagnosis not present

## 2021-05-18 DIAGNOSIS — N189 Chronic kidney disease, unspecified: Secondary | ICD-10-CM | POA: Diagnosis not present

## 2021-05-18 DIAGNOSIS — K219 Gastro-esophageal reflux disease without esophagitis: Secondary | ICD-10-CM | POA: Diagnosis not present

## 2021-05-23 DIAGNOSIS — F32A Depression, unspecified: Secondary | ICD-10-CM | POA: Diagnosis not present

## 2021-05-23 DIAGNOSIS — E1122 Type 2 diabetes mellitus with diabetic chronic kidney disease: Secondary | ICD-10-CM | POA: Diagnosis not present

## 2021-05-23 DIAGNOSIS — I69320 Aphasia following cerebral infarction: Secondary | ICD-10-CM | POA: Diagnosis not present

## 2021-05-23 DIAGNOSIS — Z9181 History of falling: Secondary | ICD-10-CM | POA: Diagnosis not present

## 2021-05-23 DIAGNOSIS — Z7982 Long term (current) use of aspirin: Secondary | ICD-10-CM | POA: Diagnosis not present

## 2021-05-23 DIAGNOSIS — D631 Anemia in chronic kidney disease: Secondary | ICD-10-CM | POA: Diagnosis not present

## 2021-05-23 DIAGNOSIS — F0394 Unspecified dementia, unspecified severity, with anxiety: Secondary | ICD-10-CM | POA: Diagnosis not present

## 2021-05-23 DIAGNOSIS — I10 Essential (primary) hypertension: Secondary | ICD-10-CM | POA: Diagnosis not present

## 2021-05-23 DIAGNOSIS — Z7902 Long term (current) use of antithrombotics/antiplatelets: Secondary | ICD-10-CM | POA: Diagnosis not present

## 2021-05-23 DIAGNOSIS — E785 Hyperlipidemia, unspecified: Secondary | ICD-10-CM | POA: Diagnosis not present

## 2021-05-23 DIAGNOSIS — N189 Chronic kidney disease, unspecified: Secondary | ICD-10-CM | POA: Diagnosis not present

## 2021-05-23 DIAGNOSIS — K219 Gastro-esophageal reflux disease without esophagitis: Secondary | ICD-10-CM | POA: Diagnosis not present

## 2021-05-23 DIAGNOSIS — F0393 Unspecified dementia, unspecified severity, with mood disturbance: Secondary | ICD-10-CM | POA: Diagnosis not present

## 2021-05-25 DIAGNOSIS — E785 Hyperlipidemia, unspecified: Secondary | ICD-10-CM | POA: Diagnosis not present

## 2021-05-25 DIAGNOSIS — E1122 Type 2 diabetes mellitus with diabetic chronic kidney disease: Secondary | ICD-10-CM | POA: Diagnosis not present

## 2021-05-25 DIAGNOSIS — Z7902 Long term (current) use of antithrombotics/antiplatelets: Secondary | ICD-10-CM | POA: Diagnosis not present

## 2021-05-25 DIAGNOSIS — I10 Essential (primary) hypertension: Secondary | ICD-10-CM | POA: Diagnosis not present

## 2021-05-25 DIAGNOSIS — F0393 Unspecified dementia, unspecified severity, with mood disturbance: Secondary | ICD-10-CM | POA: Diagnosis not present

## 2021-05-25 DIAGNOSIS — K219 Gastro-esophageal reflux disease without esophagitis: Secondary | ICD-10-CM | POA: Diagnosis not present

## 2021-05-25 DIAGNOSIS — F32A Depression, unspecified: Secondary | ICD-10-CM | POA: Diagnosis not present

## 2021-05-25 DIAGNOSIS — Z7982 Long term (current) use of aspirin: Secondary | ICD-10-CM | POA: Diagnosis not present

## 2021-05-25 DIAGNOSIS — D631 Anemia in chronic kidney disease: Secondary | ICD-10-CM | POA: Diagnosis not present

## 2021-05-25 DIAGNOSIS — Z9181 History of falling: Secondary | ICD-10-CM | POA: Diagnosis not present

## 2021-05-25 DIAGNOSIS — N189 Chronic kidney disease, unspecified: Secondary | ICD-10-CM | POA: Diagnosis not present

## 2021-05-25 DIAGNOSIS — I69320 Aphasia following cerebral infarction: Secondary | ICD-10-CM | POA: Diagnosis not present

## 2021-05-25 DIAGNOSIS — F0394 Unspecified dementia, unspecified severity, with anxiety: Secondary | ICD-10-CM | POA: Diagnosis not present

## 2021-05-30 DIAGNOSIS — I10 Essential (primary) hypertension: Secondary | ICD-10-CM | POA: Diagnosis not present

## 2021-05-30 DIAGNOSIS — K219 Gastro-esophageal reflux disease without esophagitis: Secondary | ICD-10-CM | POA: Diagnosis not present

## 2021-05-30 DIAGNOSIS — Z9181 History of falling: Secondary | ICD-10-CM | POA: Diagnosis not present

## 2021-05-30 DIAGNOSIS — F0393 Unspecified dementia, unspecified severity, with mood disturbance: Secondary | ICD-10-CM | POA: Diagnosis not present

## 2021-05-30 DIAGNOSIS — E785 Hyperlipidemia, unspecified: Secondary | ICD-10-CM | POA: Diagnosis not present

## 2021-05-30 DIAGNOSIS — F0394 Unspecified dementia, unspecified severity, with anxiety: Secondary | ICD-10-CM | POA: Diagnosis not present

## 2021-05-30 DIAGNOSIS — Z7982 Long term (current) use of aspirin: Secondary | ICD-10-CM | POA: Diagnosis not present

## 2021-05-30 DIAGNOSIS — F32A Depression, unspecified: Secondary | ICD-10-CM | POA: Diagnosis not present

## 2021-05-30 DIAGNOSIS — I69320 Aphasia following cerebral infarction: Secondary | ICD-10-CM | POA: Diagnosis not present

## 2021-05-30 DIAGNOSIS — D631 Anemia in chronic kidney disease: Secondary | ICD-10-CM | POA: Diagnosis not present

## 2021-05-30 DIAGNOSIS — Z7902 Long term (current) use of antithrombotics/antiplatelets: Secondary | ICD-10-CM | POA: Diagnosis not present

## 2021-05-30 DIAGNOSIS — E1122 Type 2 diabetes mellitus with diabetic chronic kidney disease: Secondary | ICD-10-CM | POA: Diagnosis not present

## 2021-05-30 DIAGNOSIS — N189 Chronic kidney disease, unspecified: Secondary | ICD-10-CM | POA: Diagnosis not present

## 2021-05-31 ENCOUNTER — Telehealth: Payer: Self-pay

## 2021-05-31 NOTE — Telephone Encounter (Signed)
(  5:25 pm) SW left a message for patient's daughter requesting a call back.

## 2021-06-01 DIAGNOSIS — D631 Anemia in chronic kidney disease: Secondary | ICD-10-CM | POA: Diagnosis not present

## 2021-06-01 DIAGNOSIS — E785 Hyperlipidemia, unspecified: Secondary | ICD-10-CM | POA: Diagnosis not present

## 2021-06-01 DIAGNOSIS — I10 Essential (primary) hypertension: Secondary | ICD-10-CM | POA: Diagnosis not present

## 2021-06-01 DIAGNOSIS — Z7982 Long term (current) use of aspirin: Secondary | ICD-10-CM | POA: Diagnosis not present

## 2021-06-01 DIAGNOSIS — Z7902 Long term (current) use of antithrombotics/antiplatelets: Secondary | ICD-10-CM | POA: Diagnosis not present

## 2021-06-01 DIAGNOSIS — E1122 Type 2 diabetes mellitus with diabetic chronic kidney disease: Secondary | ICD-10-CM | POA: Diagnosis not present

## 2021-06-01 DIAGNOSIS — N189 Chronic kidney disease, unspecified: Secondary | ICD-10-CM | POA: Diagnosis not present

## 2021-06-01 DIAGNOSIS — K219 Gastro-esophageal reflux disease without esophagitis: Secondary | ICD-10-CM | POA: Diagnosis not present

## 2021-06-01 DIAGNOSIS — F32A Depression, unspecified: Secondary | ICD-10-CM | POA: Diagnosis not present

## 2021-06-01 DIAGNOSIS — I69320 Aphasia following cerebral infarction: Secondary | ICD-10-CM | POA: Diagnosis not present

## 2021-06-01 DIAGNOSIS — Z9181 History of falling: Secondary | ICD-10-CM | POA: Diagnosis not present

## 2021-06-01 DIAGNOSIS — F0394 Unspecified dementia, unspecified severity, with anxiety: Secondary | ICD-10-CM | POA: Diagnosis not present

## 2021-06-01 DIAGNOSIS — F0393 Unspecified dementia, unspecified severity, with mood disturbance: Secondary | ICD-10-CM | POA: Diagnosis not present

## 2021-06-06 DIAGNOSIS — F32A Depression, unspecified: Secondary | ICD-10-CM | POA: Diagnosis not present

## 2021-06-06 DIAGNOSIS — D631 Anemia in chronic kidney disease: Secondary | ICD-10-CM | POA: Diagnosis not present

## 2021-06-06 DIAGNOSIS — K219 Gastro-esophageal reflux disease without esophagitis: Secondary | ICD-10-CM | POA: Diagnosis not present

## 2021-06-06 DIAGNOSIS — Z7982 Long term (current) use of aspirin: Secondary | ICD-10-CM | POA: Diagnosis not present

## 2021-06-06 DIAGNOSIS — Z9181 History of falling: Secondary | ICD-10-CM | POA: Diagnosis not present

## 2021-06-06 DIAGNOSIS — F0393 Unspecified dementia, unspecified severity, with mood disturbance: Secondary | ICD-10-CM | POA: Diagnosis not present

## 2021-06-06 DIAGNOSIS — E1122 Type 2 diabetes mellitus with diabetic chronic kidney disease: Secondary | ICD-10-CM | POA: Diagnosis not present

## 2021-06-06 DIAGNOSIS — Z7902 Long term (current) use of antithrombotics/antiplatelets: Secondary | ICD-10-CM | POA: Diagnosis not present

## 2021-06-06 DIAGNOSIS — N189 Chronic kidney disease, unspecified: Secondary | ICD-10-CM | POA: Diagnosis not present

## 2021-06-06 DIAGNOSIS — E785 Hyperlipidemia, unspecified: Secondary | ICD-10-CM | POA: Diagnosis not present

## 2021-06-06 DIAGNOSIS — F0394 Unspecified dementia, unspecified severity, with anxiety: Secondary | ICD-10-CM | POA: Diagnosis not present

## 2021-06-06 DIAGNOSIS — I10 Essential (primary) hypertension: Secondary | ICD-10-CM | POA: Diagnosis not present

## 2021-06-06 DIAGNOSIS — I69320 Aphasia following cerebral infarction: Secondary | ICD-10-CM | POA: Diagnosis not present

## 2021-06-13 ENCOUNTER — Other Ambulatory Visit: Payer: Medicare Other

## 2021-06-13 ENCOUNTER — Other Ambulatory Visit: Payer: Self-pay

## 2021-06-13 ENCOUNTER — Other Ambulatory Visit: Payer: Medicare Other | Admitting: *Deleted

## 2021-06-13 DIAGNOSIS — Z9181 History of falling: Secondary | ICD-10-CM | POA: Diagnosis not present

## 2021-06-13 DIAGNOSIS — I10 Essential (primary) hypertension: Secondary | ICD-10-CM | POA: Diagnosis not present

## 2021-06-13 DIAGNOSIS — Z7902 Long term (current) use of antithrombotics/antiplatelets: Secondary | ICD-10-CM | POA: Diagnosis not present

## 2021-06-13 DIAGNOSIS — F0394 Unspecified dementia, unspecified severity, with anxiety: Secondary | ICD-10-CM | POA: Diagnosis not present

## 2021-06-13 DIAGNOSIS — N189 Chronic kidney disease, unspecified: Secondary | ICD-10-CM | POA: Diagnosis not present

## 2021-06-13 DIAGNOSIS — F32A Depression, unspecified: Secondary | ICD-10-CM | POA: Diagnosis not present

## 2021-06-13 DIAGNOSIS — D631 Anemia in chronic kidney disease: Secondary | ICD-10-CM | POA: Diagnosis not present

## 2021-06-13 DIAGNOSIS — F0393 Unspecified dementia, unspecified severity, with mood disturbance: Secondary | ICD-10-CM | POA: Diagnosis not present

## 2021-06-13 DIAGNOSIS — Z515 Encounter for palliative care: Secondary | ICD-10-CM

## 2021-06-13 DIAGNOSIS — E785 Hyperlipidemia, unspecified: Secondary | ICD-10-CM | POA: Diagnosis not present

## 2021-06-13 DIAGNOSIS — Z7982 Long term (current) use of aspirin: Secondary | ICD-10-CM | POA: Diagnosis not present

## 2021-06-13 DIAGNOSIS — E1122 Type 2 diabetes mellitus with diabetic chronic kidney disease: Secondary | ICD-10-CM | POA: Diagnosis not present

## 2021-06-13 DIAGNOSIS — K219 Gastro-esophageal reflux disease without esophagitis: Secondary | ICD-10-CM | POA: Diagnosis not present

## 2021-06-13 DIAGNOSIS — I69320 Aphasia following cerebral infarction: Secondary | ICD-10-CM | POA: Diagnosis not present

## 2021-06-14 NOTE — Progress Notes (Signed)
COMMUNITY PALLIATIVE CARE SW NOTE  PATIENT NAME: Lindsay Martinez DOB: 05/26/1931 MRN: [id number]  PRIMARY CARE PROVIDER: Kristen Loader, FNP  RESPONSIBLE PARTY:  Acct ID - Guarantor Home Phone Work Phone Relationship Acct Type  [id number] Lindsay Martinez, [id number]  Self P/F     C/O [facility] Deed, [address] [place], [place] [address]     PLAN OF CARE and INTERVENTIONS:             GOALS OF CARE/ ADVANCE CARE PLANNING:  Goal is to keep patient at home with her family. Patient is a DNR. SOCIAL/EMOTIONAL/SPIRITUAL ASSESSMENT/ INTERVENTIONS:  SW , RN-M. Howard and NP-K. Highfill completed an initial visit with patient at her home. She present with her daughter-Kimberly and son-in-law-Craig. The team met with her daughter prior to meeting patient. The daughter was provided education regarding palliative care which she provided verbal consent and provided a status update on patient. Patient is currently receiving PT/OT 2x week through [facility]. Patient spends her day upstairs in her room, watching TV. She picks with her nails and lips-daughter was encouraged to get patient a fiddle blanket. She has ongoing involuntary movement in her right foot. Patient has incontinent episodes with her bowel. Her daughter assist her with personal care/ADL's. Patient ambulates with a walker. Patient has hemorrhoids and constipation. NP recommend that patient sit on a cool silicone beads to help with inflammation. Patient's appetite is good and her current weight is 160 lbs. Patient is having increased confusion, forgetfulness.and often repeats back what is said to her. She is verbal when prompted, but was generally quiet during this visit. Patient is a DNR. She has a living will, POA/HCPOA. Patient and her daughter are open to ongoing palliative care support. PATIENT/CAREGIVER EDUCATION/ COPING: Patient appears to be coping well. Her daughter is very supportive. PERSONAL EMERGENCY PLAN:  911 can be activated for  emergencies. COMMUNITY RESOURCES COORDINATION/ HEALTH CARE NAVIGATION:  Patient is currently receiving PT/OT through [facility]. FINANCIAL/LEGAL CONCERNS/INTERVENTIONS:  None.     SOCIAL HX:  Social History   Tobacco Use   Smoking status: Not on file   Smokeless tobacco: Never  Substance Use Topics   Alcohol use: Yes    CODE STATUS: DNR ADVANCED DIRECTIVES: Yes MOST FORM COMPLETE: No HOSPICE EDUCATION PROVIDED: Yes, difference in hospice and palliative care.  PPS: Patient is alert and oriented to self and situation. She is having increased confusion, forgetfulness and is repetitive.  Duration of visit and documentation: 80 minutes.  [address] [place], [place]

## 2021-06-20 DIAGNOSIS — N189 Chronic kidney disease, unspecified: Secondary | ICD-10-CM | POA: Diagnosis not present

## 2021-06-20 DIAGNOSIS — Z7902 Long term (current) use of antithrombotics/antiplatelets: Secondary | ICD-10-CM | POA: Diagnosis not present

## 2021-06-20 DIAGNOSIS — K219 Gastro-esophageal reflux disease without esophagitis: Secondary | ICD-10-CM | POA: Diagnosis not present

## 2021-06-20 DIAGNOSIS — E1122 Type 2 diabetes mellitus with diabetic chronic kidney disease: Secondary | ICD-10-CM | POA: Diagnosis not present

## 2021-06-20 DIAGNOSIS — I10 Essential (primary) hypertension: Secondary | ICD-10-CM | POA: Diagnosis not present

## 2021-06-20 DIAGNOSIS — Z9181 History of falling: Secondary | ICD-10-CM | POA: Diagnosis not present

## 2021-06-20 DIAGNOSIS — D631 Anemia in chronic kidney disease: Secondary | ICD-10-CM | POA: Diagnosis not present

## 2021-06-20 DIAGNOSIS — F0393 Unspecified dementia, unspecified severity, with mood disturbance: Secondary | ICD-10-CM | POA: Diagnosis not present

## 2021-06-20 DIAGNOSIS — F32A Depression, unspecified: Secondary | ICD-10-CM | POA: Diagnosis not present

## 2021-06-20 DIAGNOSIS — Z7982 Long term (current) use of aspirin: Secondary | ICD-10-CM | POA: Diagnosis not present

## 2021-06-20 DIAGNOSIS — F0394 Unspecified dementia, unspecified severity, with anxiety: Secondary | ICD-10-CM | POA: Diagnosis not present

## 2021-06-20 DIAGNOSIS — E785 Hyperlipidemia, unspecified: Secondary | ICD-10-CM | POA: Diagnosis not present

## 2021-06-20 DIAGNOSIS — I69320 Aphasia following cerebral infarction: Secondary | ICD-10-CM | POA: Diagnosis not present

## 2021-06-21 ENCOUNTER — Telehealth: Payer: Self-pay | Admitting: Adult Health

## 2021-06-21 DIAGNOSIS — K219 Gastro-esophageal reflux disease without esophagitis: Secondary | ICD-10-CM | POA: Diagnosis not present

## 2021-06-21 DIAGNOSIS — Z7982 Long term (current) use of aspirin: Secondary | ICD-10-CM | POA: Diagnosis not present

## 2021-06-21 DIAGNOSIS — Z7902 Long term (current) use of antithrombotics/antiplatelets: Secondary | ICD-10-CM | POA: Diagnosis not present

## 2021-06-21 DIAGNOSIS — N189 Chronic kidney disease, unspecified: Secondary | ICD-10-CM | POA: Diagnosis not present

## 2021-06-21 DIAGNOSIS — F0394 Unspecified dementia, unspecified severity, with anxiety: Secondary | ICD-10-CM | POA: Diagnosis not present

## 2021-06-21 DIAGNOSIS — D631 Anemia in chronic kidney disease: Secondary | ICD-10-CM | POA: Diagnosis not present

## 2021-06-21 DIAGNOSIS — I10 Essential (primary) hypertension: Secondary | ICD-10-CM | POA: Diagnosis not present

## 2021-06-21 DIAGNOSIS — E785 Hyperlipidemia, unspecified: Secondary | ICD-10-CM | POA: Diagnosis not present

## 2021-06-21 DIAGNOSIS — F0393 Unspecified dementia, unspecified severity, with mood disturbance: Secondary | ICD-10-CM | POA: Diagnosis not present

## 2021-06-21 DIAGNOSIS — I69320 Aphasia following cerebral infarction: Secondary | ICD-10-CM | POA: Diagnosis not present

## 2021-06-21 DIAGNOSIS — F32A Depression, unspecified: Secondary | ICD-10-CM | POA: Diagnosis not present

## 2021-06-21 DIAGNOSIS — Z9181 History of falling: Secondary | ICD-10-CM | POA: Diagnosis not present

## 2021-06-21 DIAGNOSIS — E1122 Type 2 diabetes mellitus with diabetic chronic kidney disease: Secondary | ICD-10-CM | POA: Diagnosis not present

## 2021-06-21 NOTE — Telephone Encounter (Signed)
Pt daughter stated that's not going to happen. Her mom is 63, she can barely get her out of the bed to use bathroom and she would have to pay $800 for ambulance to transport her here and back. She stated since she has been cleared on cardio monitor she is feels no need for her to comeback here. I did iterate to her we do treat her for stroke not for cardio issues, she understood but still denied the need to come back. Will cancel pt appt.

## 2021-06-21 NOTE — Telephone Encounter (Signed)
Per your note 03/28/21 "Please complete heart monitor - I will reach out to cardiology to further assist with this"   Pt need to contact cardio, correct?

## 2021-06-21 NOTE — Telephone Encounter (Signed)
As she has had an additional stroke since prior visit, preferred method of visit would be in person. Thank you

## 2021-06-21 NOTE — Telephone Encounter (Signed)
Pt's daughter(which is who pt lives with) is asking for the results to the heart monitor.

## 2021-06-21 NOTE — Telephone Encounter (Signed)
Contacted daughter back, informed her of Lindsay Martinez's message and she was appreciative. She also wanted to know if her mom need the follow up next week and if so can it be VV.  Link sent just in case for set up.

## 2021-06-21 NOTE — Telephone Encounter (Signed)
Per report, no evidence of atrial fibrillation which was the reason heart monitor was being completed. If they would like additional information regarding cardiac monitor they would have to reach out to cardiology. Thank you

## 2021-06-28 ENCOUNTER — Ambulatory Visit: Payer: Medicare Other | Admitting: Adult Health

## 2021-07-28 DIAGNOSIS — N1832 Chronic kidney disease, stage 3b: Secondary | ICD-10-CM | POA: Diagnosis not present

## 2021-07-28 DIAGNOSIS — I10 Essential (primary) hypertension: Secondary | ICD-10-CM | POA: Diagnosis not present

## 2021-07-28 DIAGNOSIS — E782 Mixed hyperlipidemia: Secondary | ICD-10-CM | POA: Diagnosis not present

## 2021-07-28 DIAGNOSIS — E1169 Type 2 diabetes mellitus with other specified complication: Secondary | ICD-10-CM | POA: Diagnosis not present

## 2022-03-17 IMAGING — CT CT RENAL STONE PROTOCOL
2 of 4 series · 15 of 46 positions shown, 17 images · non-contrast
Comparison: None.

CLINICAL DATA: Flank pain, kidney stone suspected.

EXAM:
CT ABDOMEN AND PELVIS WITHOUT CONTRAST
TECHNIQUE: Multidetector CT imaging of the abdomen and pelvis was performed
following the standard protocol without IV contrast.

[Series 4: ap without · axial · non-contrast · 0.98mm/px · z∈[+359,+784]mm · 12 of 97 slices shown, 14 images]
[im 6/97  soft-tissue]
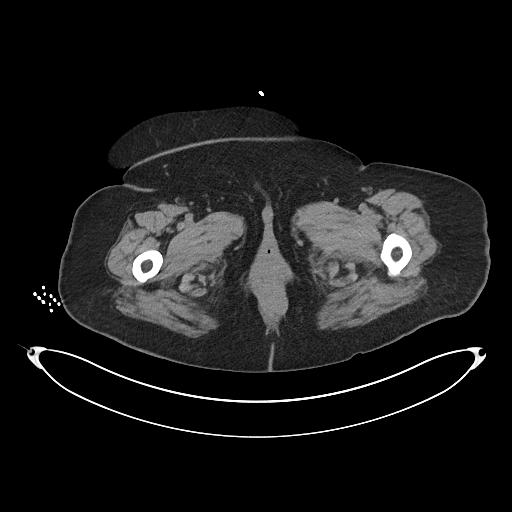
[im 6/97  bone]
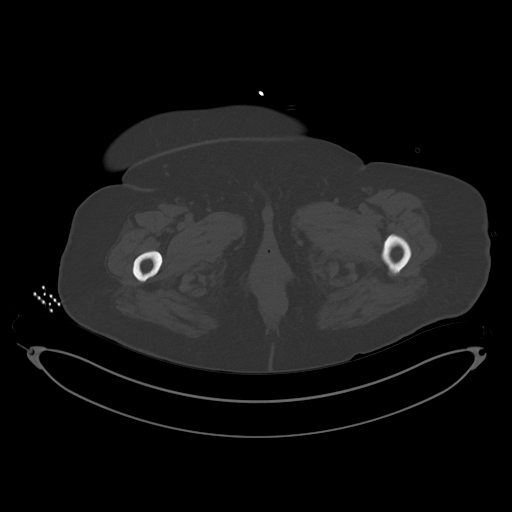
[im 17/97  soft-tissue]
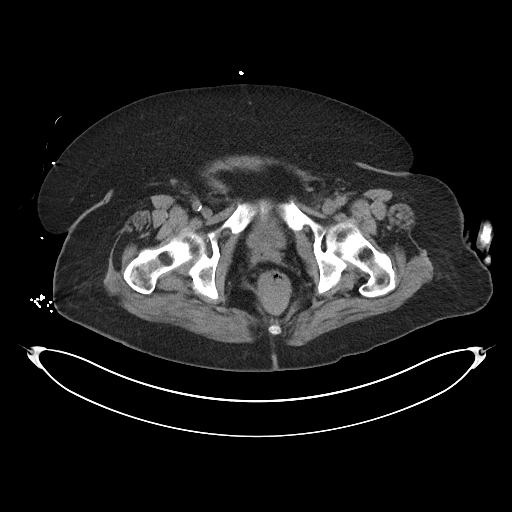
[im 22/97  soft-tissue]
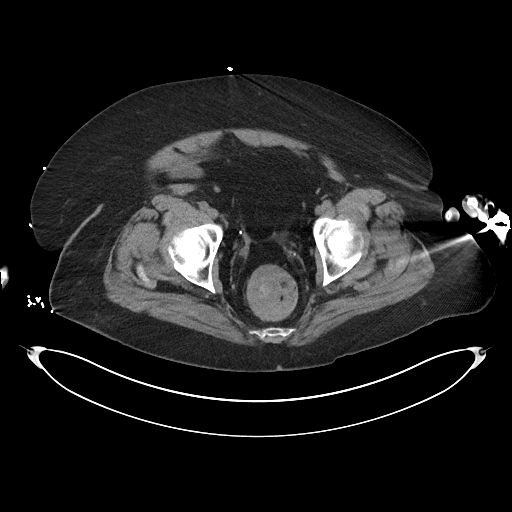
[im 27/97  soft-tissue]
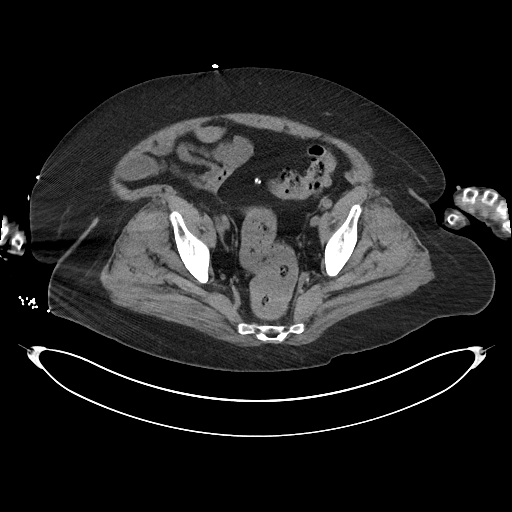
[im 38/97  soft-tissue]
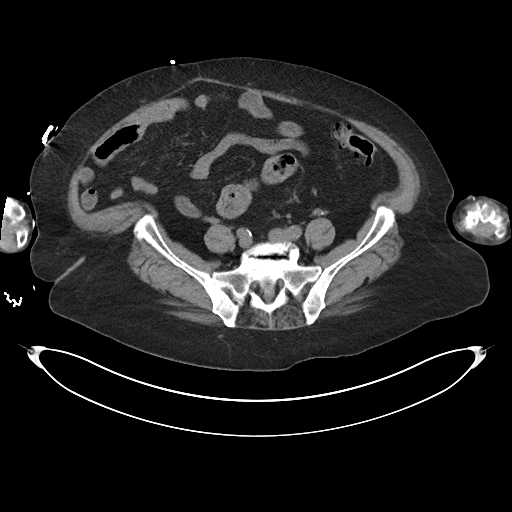
[im 43/97  soft-tissue]
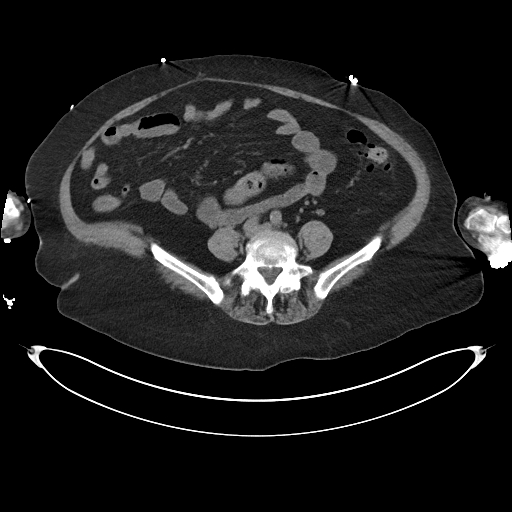
[im 54/97  soft-tissue]
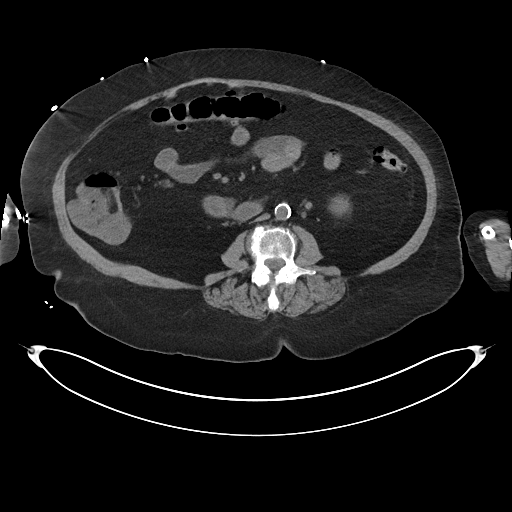
[im 59/97  soft-tissue]
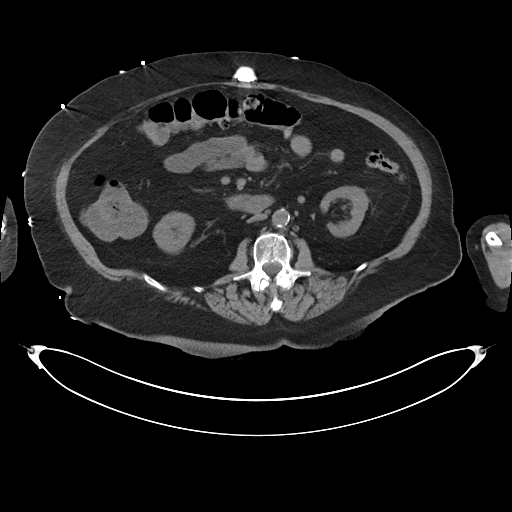
[im 70/97  soft-tissue]
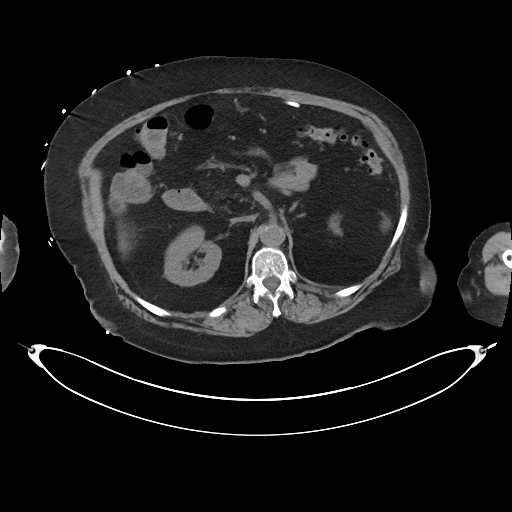
[im 70/97  bone]
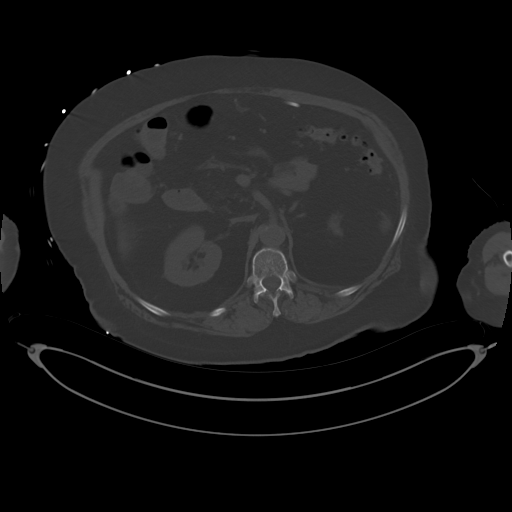
[im 75/97  soft-tissue]
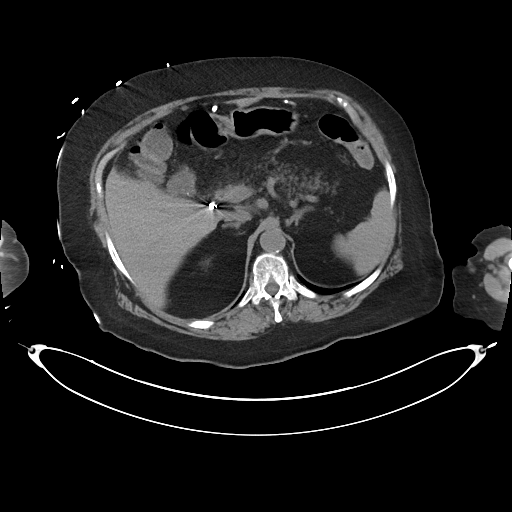
[im 81/97  soft-tissue]
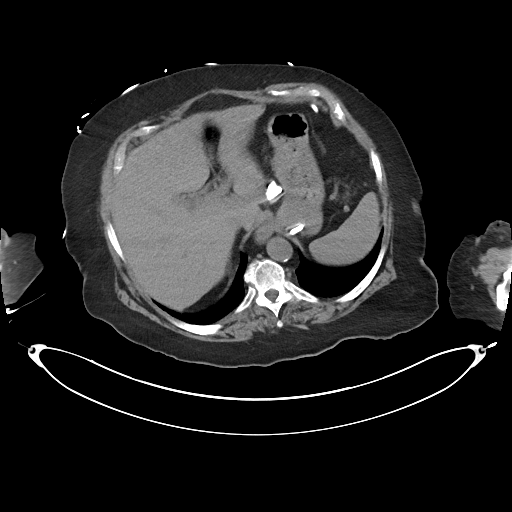
[im 91/97  soft-tissue]
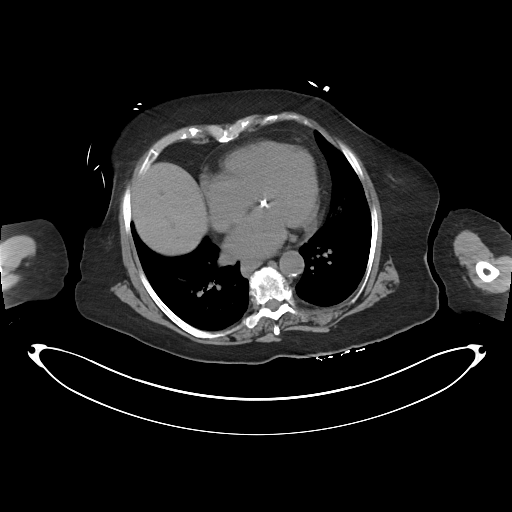

[Series 7: cor · coronal · 0.97mm/px · 3 of 107 slices shown]
[im 36/107  soft-tissue]
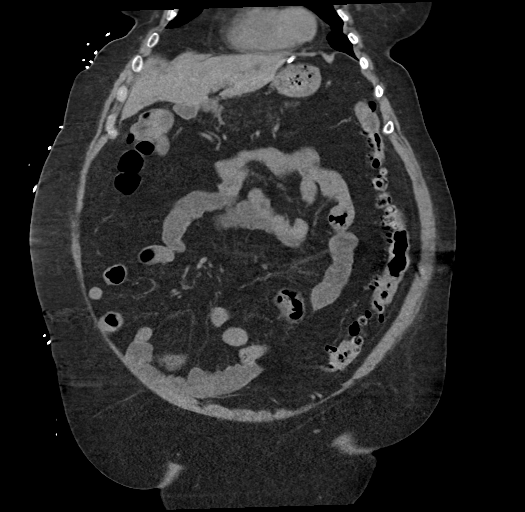
[im 48/107  soft-tissue]
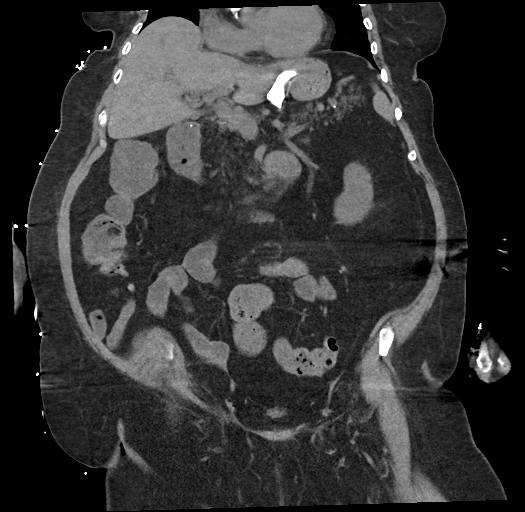
[im 59/107  soft-tissue]
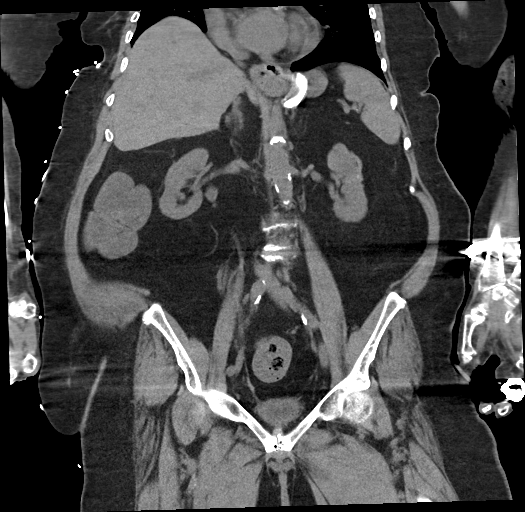

[15 of 46 positions shown; findings below may reference images not displayed]

FINDINGS: Lower chest: Bronchial wall thickening with patchy nodular
ground-glass opacities in the right middle lobe and lingula.
Hypoventilatory change in the dependent lungs. Cardiac enlargement.
Coronary artery calcifications. Small pericardial effusion, likely
physiologic.

Hepatobiliary: There are few hypodense hepatic lesions the largest
of which measures 1 cm in the hepatic dome on image [DATE] difficult
to characterize due to size and respiratory motion but favored
represent cysts. Otherwise unremarkable noncontrast appearance of
the hepatic parenchyma. Gallbladder surgically absent. No biliary
ductal dilation.

Pancreas: Fatty pancreatic atrophy.  No discrete ductal dilation.

Spleen: Normal in size without focal abnormality.

Adrenals/Urinary Tract: Bilateral adrenal glands are unremarkable.

No hydronephrosis. Atrophic kidneys. 1.1 cm renal angiomyolipoma in
the upper pole the right kidney. 2.8 cm lesion in the upper pole of
the left kidney with density greater than that expected for a simple
renal cyst but incompletely evaluated without IV contrast on image
[DATE]. 4 mm calcification along the inferior aspect of the lesion
which may represent calcifications in the wall of the lesion or a
small renal stone. No right-sided renal stones. No ureteral or
bladder calculi visualized. Urinary bladder is decompressed limiting
evaluation.

Stomach/Bowel: Small hiatal hernia. Changes of prior gastric banding
with reservoir in the anterior abdominal wall. Stomach is grossly
unremarkable for degree of distension. Fluid-filled loops of small
bowel without dilation. Appendix is grossly unremarkable. Fluid
throughout the ascending and proximal transverse colon. Left-sided
colonic diverticulosis without findings of acute diverticulitis.
Fluid-filled rectum.

Vascular/Lymphatic: Aortic atherosclerosis. No enlarged abdominal or
pelvic lymph nodes.

Reproductive: Status post hysterectomy. No adnexal masses.

Other: No abdominopelvic ascites.

Musculoskeletal: Multilevel degenerative changes spine. No acute
osseous abnormality.
IMPRESSION: 1. Fluid throughout nondilated small bowel, proximal colon and
rectum as can be seen with diarrheal illness.
2. No hydronephrosis. No ureteral or bladder calculi visualized.
3. Bronchial wall thickening with patchy nodular ground-glass
opacities in the right middle lobe and lingula may represent an
infectious or inflammatory process, including mycobacterium avium
intracellulare.
4. Left upper pole renal lesion measuring 2.8 cm with density
greater than that expected for a simple renal cyst and may represent
a hemorrhagic/proteinaceous cyst but is incompletely evaluated on
this single phase study without IV contrast. Further evaluation with
nonemergent outpatient renal protocol CT or MRI with and without
contrast is recommended.
5. 1.1 cm renal angiomyolipoma in the upper pole the right kidney.
6. Aortic atherosclerosis.

Aortic Atherosclerosis ([id number]-TCJ.J).

## 2022-05-29 DIAGNOSIS — K219 Gastro-esophageal reflux disease without esophagitis: Secondary | ICD-10-CM | POA: Diagnosis not present

## 2022-05-29 DIAGNOSIS — E1169 Type 2 diabetes mellitus with other specified complication: Secondary | ICD-10-CM | POA: Diagnosis not present

## 2022-05-29 DIAGNOSIS — E782 Mixed hyperlipidemia: Secondary | ICD-10-CM | POA: Diagnosis not present

## 2022-05-29 DIAGNOSIS — I10 Essential (primary) hypertension: Secondary | ICD-10-CM | POA: Diagnosis not present

## 2022-05-29 DIAGNOSIS — N1832 Chronic kidney disease, stage 3b: Secondary | ICD-10-CM | POA: Diagnosis not present

## 2022-06-07 IMAGING — CR DG KNEE COMPLETE 4+V*L*
4 series · 4 of 4 positions shown · non-contrast
Comparison: None.

CLINICAL DATA: Fall, pain

EXAM:
LEFT KNEE - COMPLETE 4+ VIEW

[t knee ap right]
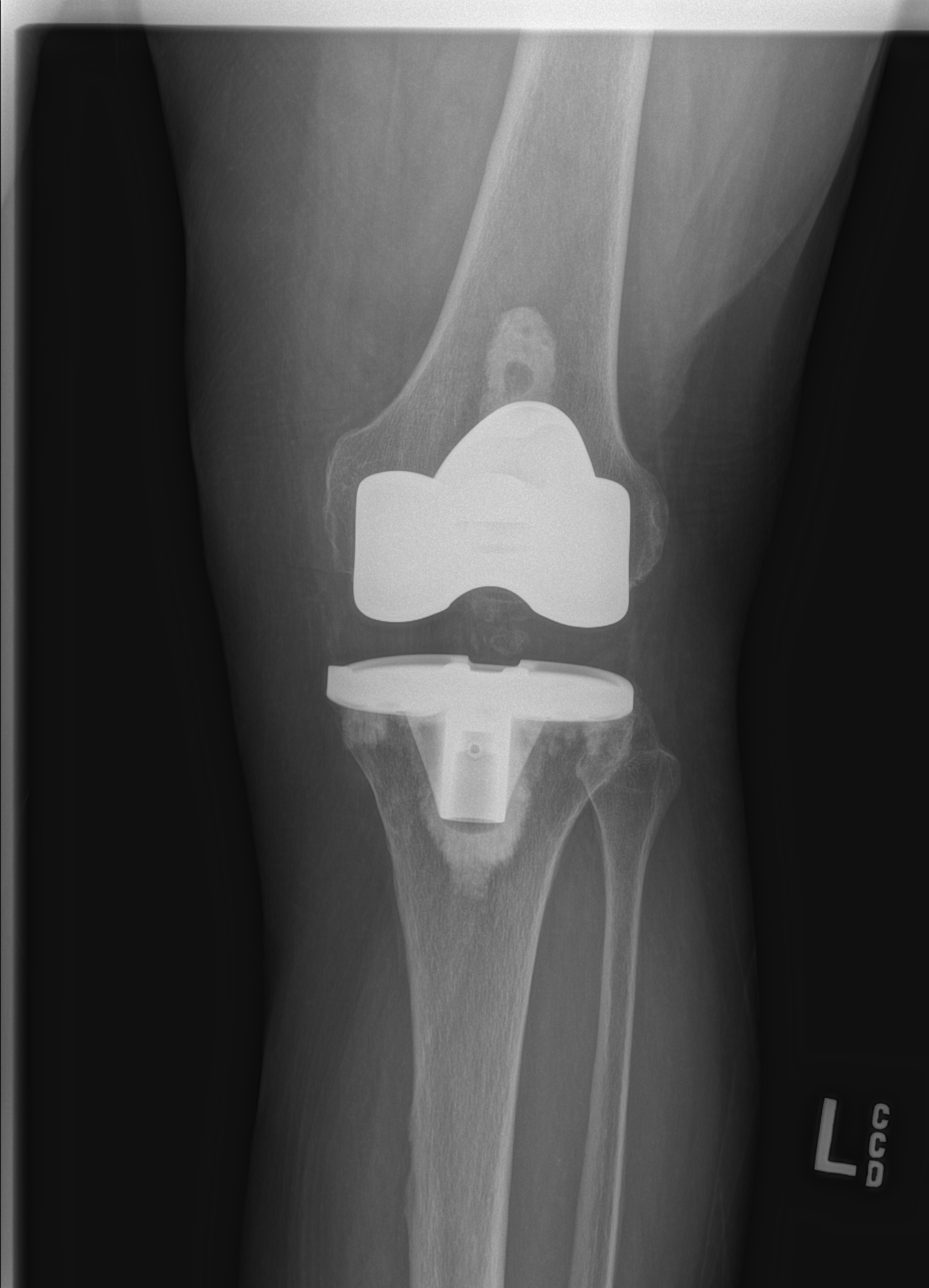

[t knee obl right (1 of 2)]
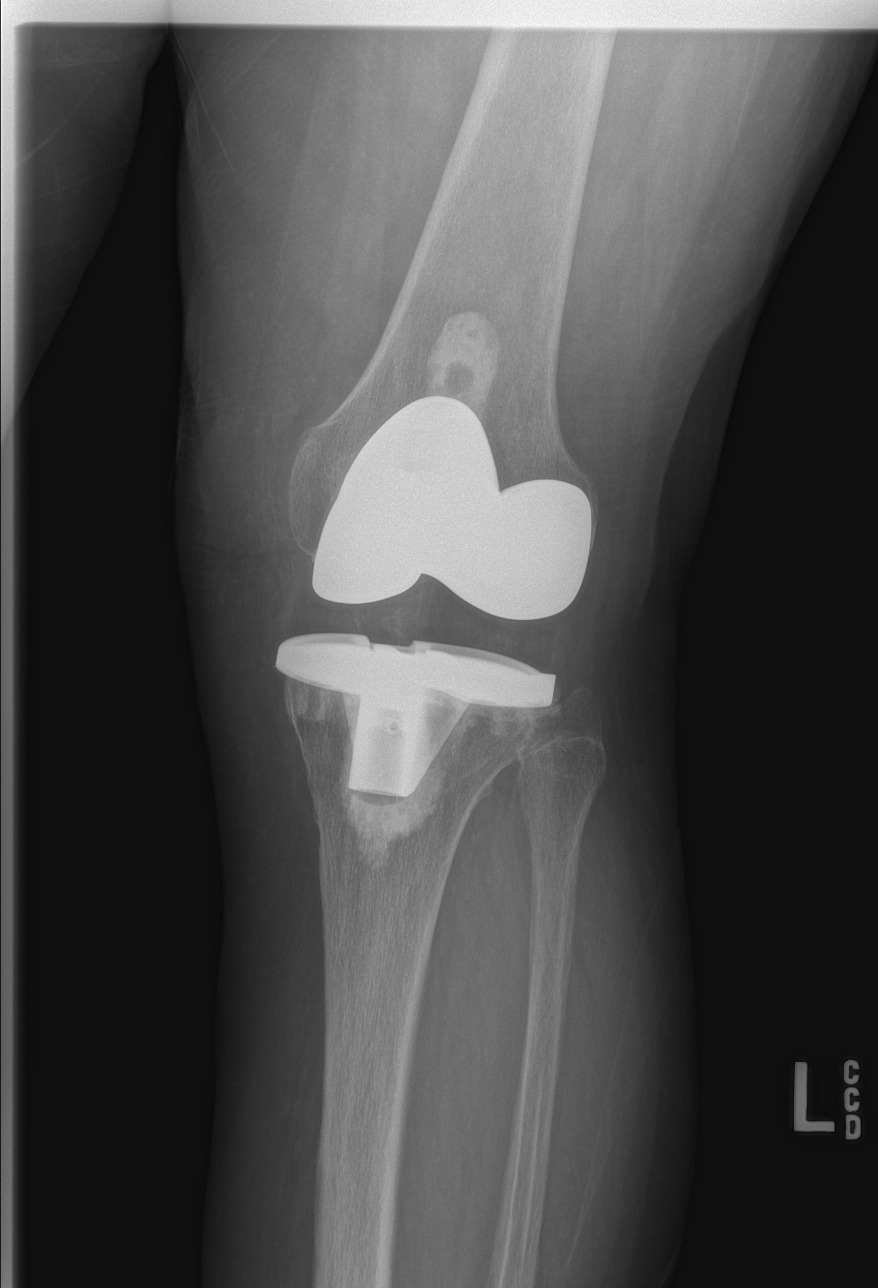

[t knee obl right (2 of 2)]
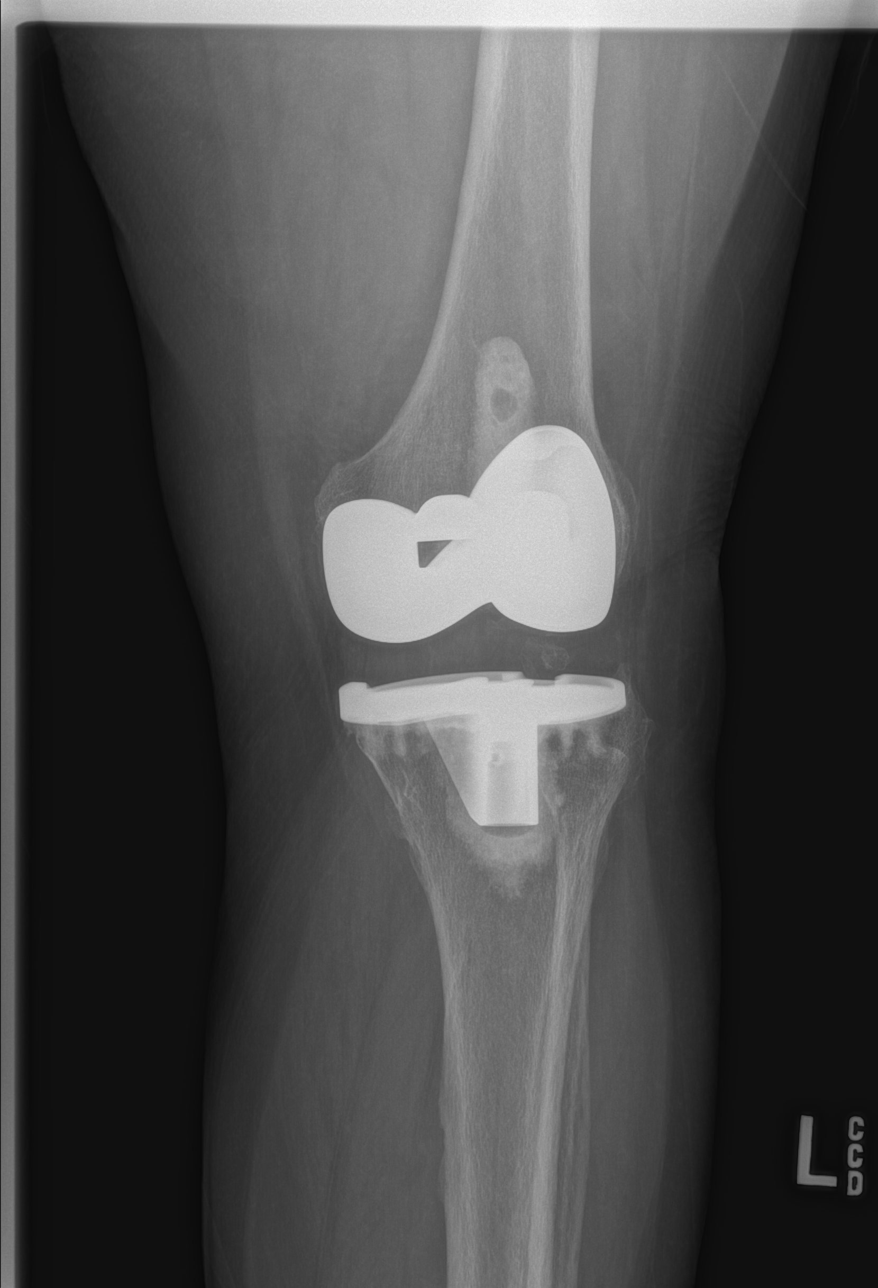

[t knee lat right]
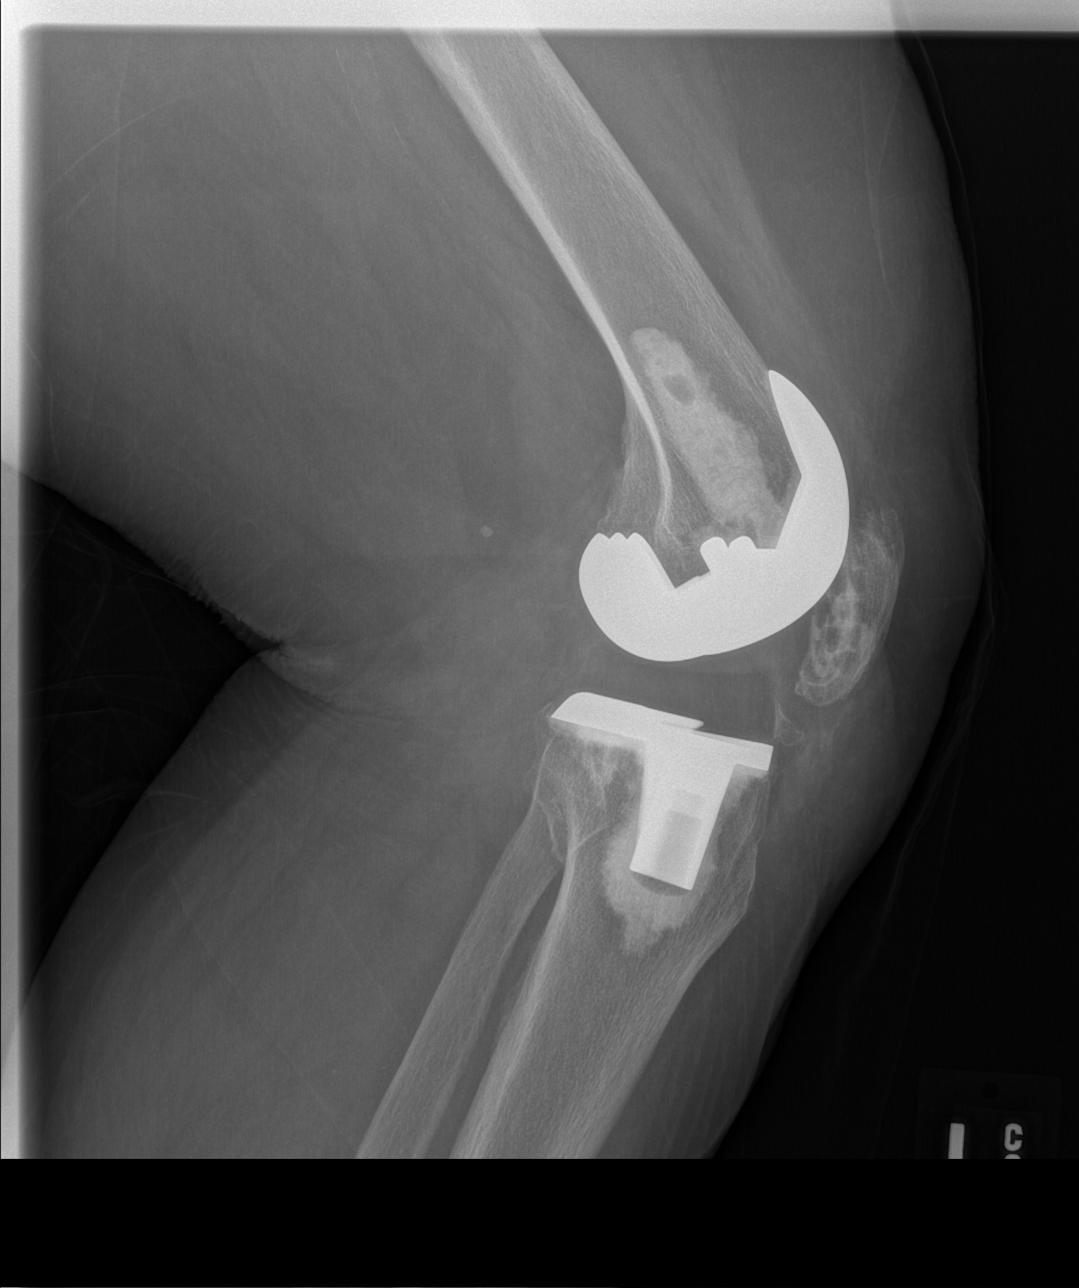

[4 of 4 positions shown; findings below may reference images not displayed]

FINDINGS: Status post left knee total arthroplasty. No evidence of
perihardware fracture or component loosening. Soft tissues are
unremarkable.
IMPRESSION: Status post left knee total arthroplasty without evidence of
perihardware fracture or component loosening.

## 2022-07-18 DIAGNOSIS — E1122 Type 2 diabetes mellitus with diabetic chronic kidney disease: Secondary | ICD-10-CM | POA: Diagnosis not present

## 2022-07-18 DIAGNOSIS — R5381 Other malaise: Secondary | ICD-10-CM | POA: Diagnosis not present

## 2022-07-18 DIAGNOSIS — I10 Essential (primary) hypertension: Secondary | ICD-10-CM | POA: Diagnosis not present

## 2022-07-18 DIAGNOSIS — R4701 Aphasia: Secondary | ICD-10-CM | POA: Diagnosis not present

## 2022-07-18 DIAGNOSIS — N1832 Chronic kidney disease, stage 3b: Secondary | ICD-10-CM | POA: Diagnosis not present

## 2022-07-18 DIAGNOSIS — E782 Mixed hyperlipidemia: Secondary | ICD-10-CM | POA: Diagnosis not present

## 2023-07-18 DIAGNOSIS — I272 Pulmonary hypertension, unspecified: Secondary | ICD-10-CM | POA: Diagnosis not present

## 2023-07-18 DIAGNOSIS — F5101 Primary insomnia: Secondary | ICD-10-CM | POA: Diagnosis not present

## 2023-07-18 DIAGNOSIS — E1122 Type 2 diabetes mellitus with diabetic chronic kidney disease: Secondary | ICD-10-CM | POA: Diagnosis not present

## 2023-07-18 DIAGNOSIS — L89301 Pressure ulcer of unspecified buttock, stage 1: Secondary | ICD-10-CM | POA: Diagnosis not present

## 2023-07-18 DIAGNOSIS — K219 Gastro-esophageal reflux disease without esophagitis: Secondary | ICD-10-CM | POA: Diagnosis not present

## 2023-07-18 DIAGNOSIS — N1832 Chronic kidney disease, stage 3b: Secondary | ICD-10-CM | POA: Diagnosis not present

## 2023-07-21 DIAGNOSIS — R531 Weakness: Secondary | ICD-10-CM | POA: Diagnosis not present
